# Patient Record
Sex: Male | Born: 1935 | Race: White | Hispanic: No | Marital: Married | Smoking: Never smoker
Health system: Southern US, Community
[De-identification: ages and names within clinical notes are randomized; demographics above are authoritative.]

## PROBLEM LIST (undated history)

## (undated) DIAGNOSIS — I4891 Unspecified atrial fibrillation: Secondary | ICD-10-CM

## (undated) DIAGNOSIS — R2681 Unsteadiness on feet: Secondary | ICD-10-CM

## (undated) DIAGNOSIS — I509 Heart failure, unspecified: Secondary | ICD-10-CM

## (undated) DIAGNOSIS — N529 Male erectile dysfunction, unspecified: Secondary | ICD-10-CM

## (undated) DIAGNOSIS — F0390 Unspecified dementia without behavioral disturbance: Secondary | ICD-10-CM

## (undated) DIAGNOSIS — I10 Essential (primary) hypertension: Secondary | ICD-10-CM

## (undated) DIAGNOSIS — I2510 Atherosclerotic heart disease of native coronary artery without angina pectoris: Secondary | ICD-10-CM

## (undated) DIAGNOSIS — R531 Weakness: Secondary | ICD-10-CM

## (undated) DIAGNOSIS — D649 Anemia, unspecified: Secondary | ICD-10-CM

## (undated) DIAGNOSIS — E119 Type 2 diabetes mellitus without complications: Secondary | ICD-10-CM

## (undated) DIAGNOSIS — E785 Hyperlipidemia, unspecified: Secondary | ICD-10-CM

## (undated) DIAGNOSIS — K449 Diaphragmatic hernia without obstruction or gangrene: Secondary | ICD-10-CM

## (undated) HISTORY — DX: Type 2 diabetes mellitus without complications: E11.9

## (undated) HISTORY — DX: Unsteadiness on feet: R26.81

## (undated) HISTORY — DX: Atherosclerotic heart disease of native coronary artery without angina pectoris: I25.10

## (undated) HISTORY — PX: COLONOSCOPY: SHX174

## (undated) HISTORY — DX: Essential (primary) hypertension: I10

## (undated) HISTORY — PX: CARDIAC CATHETERIZATION: SHX172

## (undated) HISTORY — DX: Unspecified atrial fibrillation: I48.91

## (undated) HISTORY — DX: Hyperlipidemia, unspecified: E78.5

## (undated) HISTORY — PX: TONSILLECTOMY: SUR1361

## (undated) HISTORY — DX: Diaphragmatic hernia without obstruction or gangrene: K44.9

## (undated) HISTORY — PX: CATARACT EXTRACTION, BILATERAL: SHX1313

## (undated) HISTORY — DX: Male erectile dysfunction, unspecified: N52.9

---

## 1999-11-05 ENCOUNTER — Ambulatory Visit (HOSPITAL_COMMUNITY): Admission: RE | Admit: 1999-11-05 | Discharge: 1999-11-06 | Payer: Self-pay | Admitting: Cardiology

## 2003-07-01 DIAGNOSIS — E119 Type 2 diabetes mellitus without complications: Secondary | ICD-10-CM

## 2003-07-01 HISTORY — DX: Type 2 diabetes mellitus without complications: E11.9

## 2008-05-09 ENCOUNTER — Ambulatory Visit (HOSPITAL_COMMUNITY): Admission: RE | Admit: 2008-05-09 | Discharge: 2008-05-09 | Payer: Self-pay | Admitting: Cardiology

## 2010-11-12 NOTE — H&P (Signed)
Bruce Mann, Bruce Mann NO.:  [id number]   MEDICAL RECORD NO.:  [id number]           PATIENT TYPE:   LOCATION:                                 FACILITY:   PHYSICIAN:  Peter M. [place], M.D.  DATE OF BIRTH:  [birth year]   DATE OF ADMISSION:  05/09/2008  DATE OF DISCHARGE:                              HISTORY & PHYSICAL   HISTORY OF PRESENT ILLNESS:  Bruce Mann is a 77 year old white male with  known history of coronary artery disease.  He underwent angioplasty of  the first obtuse marginal vessel in May 2001.  It was also noted at that  time that he had a severe stenosis in a nondominant right coronary  artery.  He has done well since then without significant anginal  symptoms.  On recent followup, he had a stress Cardiolite study.  He was  exercised on the standard Bruce protocol for 7-1/2 minutes.  He had  marked ischemic ST changes and frequent PVCs with runs of nonsustained  ventricular tachycardia.  He clinically had no symptoms of chest pain.  His subsequent Cardiolite images looked fairly good without significant  ischemia and normal ejection fraction of 70%.  However, based on his  decline in exercise tolerance and marked ECG changes and nonsustained  ventricular tachycardia, it was recommended he undergo repeat cardiac  catheterization.   His past medical history is significant for:  1. Coronary artery disease with remote angioplasty of left circumflex      coronary in 2001 in the first obtuse marginal branch.  2. Hyperlipidemia.  3. Diabetes mellitus type 2.  4. Hypercholesterolemia.  5. He also has a history of hiatal hernia.   He has no known allergies.   CURRENT MEDICATIONS:  1. Cozaar 50 mg per day.  2. HCTZ 25 mg per day.  3. Lipitor 80 mg per day.  4. Atenolol 25 mg b.i.d.  5. Norvasc 5 mg per day.  6. Aspirin 81 mg per day.  7. Calcium plus D daily.  8. Actos 30 mg per day.  9. Fish oil 1000 mg per day.  10.Trilipix 635 mg per day.  11.Prilosec 20 mg per day.  12.Multivitamin daily.   SOCIAL HISTORY:  The patient is semi-retired.  He is married and has 4  children.  Denies history of tobacco or alcohol use.   FAMILY HISTORY:  Father died at age 110 of myocardial infarction.  One  brother has had myocardial infarctions.  His mother died at age 57 with  a stroke.   His review of systems are otherwise unremarkable.   PHYSICAL EXAMINATION:  GENERAL:  The patient is a pleasant white male in  no distress.  VITAL SIGNS:  Weight is 178, blood pressure is 134/68, and pulse is 60  and regular.  HEENT:  Normocephalic and atraumatic.  Pupils are equal, round, and  reactive to light and accommodation.  Extraocular movements are full.  Oropharynx is clear.  NECK:  Supple without JVD, adenopathy, thyromegaly, or bruits.  LUNGS:  Clear to auscultation and percussion.  CARDIAC:  Regular rate and rhythm  without gallop, murmur, rub, or click.  ABDOMEN: Soft and nontender.  There are no masses or bruits.  EXTREMITIES:  Without edema.  Pedal pulses are 2+ and symmetric.  NEUROLOGIC:  Nonfocal   LABORATORY DATA:  Chest x-ray showed no active disease.  ECG at rest  shows normal sinus rhythm with normal ECG.   IMPRESSION:  1. Abnormal stress Cardiolite study with decrease exercise tolerance      and marked ECG changes.  2. Remote angioplasty of first obtuse marginal vessel.  3. Diabetes mellitus type 2.  4. Hypertension.  5. Hypercholesterolemia.   PLAN:  Proceed with cardiac catheterization with further therapy pending  these results.           ______________________________  Peter M. [place], M.D.     PMJ/MEDQ  D:  05/05/2008  T:  05/06/2008  Job:  [id number]   cc:   Gaspar Garbe, M.D.

## 2010-11-12 NOTE — Cardiovascular Report (Signed)
Bruce Mann, Bruce Mann NO.:  [id number]   MEDICAL RECORD NO.:  [id number]           PATIENT TYPE:   LOCATION:                                 FACILITY:   PHYSICIAN:  Peter M. [place], M.D.  DATE OF BIRTH:  27-Oct-1930   DATE OF PROCEDURE:  DATE OF DISCHARGE:                            CARDIAC CATHETERIZATION   INDICATIONS FOR PROCEDURE:  The patient is a 77 year old white male with  history of coronary artery disease status post angioplasty of the first  obtuse marginal vessel in May 2001.  He had a recent abnormal stress  test.  The patient does have a history of diabetes, hyperlipidemia, and  hypertension.   PROCEDURE:  Left heart catheterization coronary and left ventricular  angiography.   EQUIPMENT USED:  A 6-French 4-cm right and left Judkins catheter, 6-  French pigtail catheter, 6-French arterial sheath.   MEDICATIONS:  Local anesthesia 1% Xylocaine.   CONTRAST:  Omnipaque 100 mL.   HEMODYNAMIC DATA:  Aortic pressure is 132/55 with a mean of 88 mmHg,  left ventricular pressure is 134 with EDP of 13 mmHg.   ANGIOGRAPHIC DATA:  Left coronary artery arises normally.  It is heavily  calcified in the left main coronary, the proximal and mid LAD, and the  proximal and mid circumflex.  The left main coronary is without  significant disease.   The left anterior descending artery has a bulky segmental 70% stenosis  proximally.  There is a 40-50% stenosis in the midvessel.   There is a ramus intermediate branch which has an 80% ostial stenosis.   The proximal left circumflex coronary has diffuse 40% disease.  The  first obtuse marginal vessel is widely patent.  The distal circumflex is  without significant disease.   The right coronary artery is a codominant vessel.  It is moderately  calcified in the proximal and mid segments.  There is an 80-90% proximal  stenosis followed by a 70-80% focal stenosis in the midvessel.   Left ventricular angiography was  performed in the RAO view.  This  demonstrates normal left ventricular size and contractility with normal  systolic function.  Ejection fraction is estimated at 60%.  There is no  mitral regurgitation or prolapse.   FINAL INTERPRETATION:  1. Three-vessel obstructive atherosclerotic coronary artery disease.      The patient has moderately severe disease in      the proximal left anterior descending.  He has severe disease in      the ostial ramus branch and severe disease in the proximal and mid      right coronary artery.  2. Normal left ventricular function.           ______________________________  Peter M. [place], M.D.     PMJ/MEDQ  D:  05/09/2008  T:  05/09/2008  Job:  [id number]   cc:   Gaspar Garbe, M.D.

## 2011-04-01 LAB — GLUCOSE, CAPILLARY: Glucose-Capillary: 122 — ABNORMAL HIGH

## 2012-06-30 DIAGNOSIS — I4891 Unspecified atrial fibrillation: Secondary | ICD-10-CM

## 2012-06-30 HISTORY — DX: Unspecified atrial fibrillation: I48.91

## 2012-07-07 ENCOUNTER — Ambulatory Visit (INDEPENDENT_AMBULATORY_CARE_PROVIDER_SITE_OTHER): Payer: Medicare Other | Admitting: Cardiology

## 2012-07-07 ENCOUNTER — Encounter: Payer: Self-pay | Admitting: Cardiology

## 2012-07-07 VITALS — BP 144/76 | HR 68 | Ht 68.5 in | Wt 187.1 lb

## 2012-07-07 DIAGNOSIS — I2510 Atherosclerotic heart disease of native coronary artery without angina pectoris: Secondary | ICD-10-CM | POA: Insufficient documentation

## 2012-07-07 DIAGNOSIS — I10 Essential (primary) hypertension: Secondary | ICD-10-CM

## 2012-07-07 DIAGNOSIS — E785 Hyperlipidemia, unspecified: Secondary | ICD-10-CM

## 2012-07-07 DIAGNOSIS — I4891 Unspecified atrial fibrillation: Secondary | ICD-10-CM

## 2012-07-07 MED ORDER — RIVAROXABAN 20 MG PO TABS: 20.0000 mg | ORAL_TABLET | Freq: Every day | ORAL | Status: DC

## 2012-07-07 MED ORDER — NITROGLYCERIN 0.4 MG SL SUBL: 0.4000 mg | SUBLINGUAL_TABLET | SUBLINGUAL | Status: DC | PRN

## 2012-07-07 NOTE — Progress Notes (Signed)
Bernadene Bell Date of Birth: 05/02/1931 Medical Record #[id number]  History of Present Illness: Bruce Mann is seen for followup today. It looks like it's been a couple of years since I last saw him. Unfortunately I do not have his old records and there is little information in Epic. He has a known history of coronary disease. He had angioplasty of the first obtuse marginal vessel may of 2001. In 2009 he had an abnormal stress test which led to heart catheterization. He had heavy coronary calcification. The LAD had a 70% stenosis proximally. There was a 40-50% stenosis in the mid vessel. The ramus vessel had an 80% ostial stenosis. The left circumflex had diffuse 40% disease in the previous angioplasty site was widely patent. The right coronary was a codominant vessel. It was relatively small. There is an 80-90% stenosis in the proximal vessel followed by a 70% stenosis in the mid vessel. LV function was normal at that time. He was asymptomatic. We recommended medical management. He has really done well since then and is had no symptoms of chest pain, dyspnea, or palpitations. He remains active and has been doing a lot of traveling. He has no history of stroke or TIA. He has no history of bleeding disorder.  Current Outpatient Prescriptions on File Prior to Visit  Medication Sig Dispense Refill  . amLODipine (NORVASC) 5 MG tablet Take 5 mg by mouth daily.      Marland Kitchen aspirin 81 MG tablet Take 81 mg by mouth daily.      Marland Kitchen atenolol (TENORMIN) 25 MG tablet Take 25 mg by mouth 2 (two) times daily.      Marland Kitchen atorvastatin (LIPITOR) 80 MG tablet Take 80 mg by mouth daily.      . Calcium Carbonate-Vitamin D (CALCIUM PLUS VITAMIN D PO) Take by mouth daily.      . Choline Fenofibrate (TRILIPIX PO) Take by mouth.      . hydrochlorothiazide (HYDRODIURIL) 25 MG tablet Take 25 mg by mouth daily.      Marland Kitchen losartan (COZAAR) 50 MG tablet Take 50 mg by mouth daily.      . Multiple Vitamin (MULTIVITAMIN) tablet Take 1 tablet by  mouth daily.      . Omega-3 Fatty Acids (FISH OIL) 1000 MG CAPS Take 1,000 mg by mouth daily.      . Omeprazole (PRILOSEC PO) Take by mouth daily.      . pioglitazone (ACTOS) 30 MG tablet Take 30 mg by mouth daily.      . nitroGLYCERIN (NITROSTAT) 0.4 MG SL tablet Place 1 tablet (0.4 mg total) under the tongue every 5 (five) minutes as needed for chest pain.  25 tablet  6  . Rivaroxaban (XARELTO) 20 MG TABS Take 1 tablet (20 mg total) by mouth daily.  30 tablet  6    No Known Allergies  Past Medical History  Diagnosis Date  . Dyslipidemia   . Hypertension   . CAD (coronary artery disease)   . Hyperlipidemia   . Diabetes mellitus type II   . Atrial fibrillation     Past Surgical History  Procedure Date  . Cardiac catheterization     Ejection Fraction 60%    History  Smoking status  . Never Smoker   Smokeless tobacco  . Not on file    History  Alcohol Use No    Family History  Problem Relation Age of Onset  . Stroke Mother   . Heart attack Father   . Heart  attack Brother     Review of Systems: As noted in history of present illness.  All other systems were reviewed and are negative.  Physical Exam: BP 144/76  Pulse 68  Ht 5' 8.5" (1.74 m)  Wt 187 lb 1.9 oz (84.877 kg)  BMI 28.04 kg/m2 He is a pleasant white male in no acute distress. HEENT: Normocephalic, atraumatic. Pupils equal round and reactive to light accommodation. Extraocular movements are full. Oropharynx is clear with good dental repair. Neck is supple without JVD, adenopathy, thyromegaly, or bruits. Lungs: Clear Cardiovascular: Irregular rate and rhythm without gallop, murmur, or click. Normal S1 and S2. Abdomen: Soft and nontender. No hepatosplenomegaly or masses. Bowel sounds are positive. Extremities: No cyanosis, edema. Pedal pulses are 2+ and symmetric. Skin: Warm and dry. Neuro: Alert and oriented x3. Cranial nerves II through XII are intact. He has no focal motor or sensory  deficits. LABORATORY DATA: ECG today demonstrates atrial fibrillation with a rate of 68 beats per minute. It is otherwise normal.  Assessment / Plan: 1. Atrial fibrillation-duration is unknown. Patient is asymptomatic probably because his heart rate is normal on atenolol. He has a [place] score of 3 and is at intermediate to high-risk of stroke. We will continue rate control with atenolol. I have recommended anticoagulation and he has opted to try one of the novel anticoagulants. We will start him on Xarelto 20 mg daily. He needs to stop aspirin. We will obtain an echocardiogram. I've also requested a copy of his most recent laboratory with data from his primary care. Patient has expressed interest in enrolling in the [employer] registry.  2. Coronary disease. He had moderate three-vessel disease by cardiac catheterization in 2009. He is asymptomatic. We will continue medical management with beta blocker, ARB, and amlodipine. Continue statin therapy.  3. Hypertension, controlled.  4. Hypercholesterolemia continue high-dose statin and fish oil.  5. Diabetes mellitus type 2 on oral therapy.

## 2012-07-07 NOTE — Patient Instructions (Signed)
We will start you on Xarelto 20 mg daily   Stop taking ASA  We will schedule you for an Echocardiogram  I will see you in one month.

## 2012-07-08 ENCOUNTER — Telehealth: Payer: Self-pay | Admitting: Cardiology

## 2012-07-08 NOTE — Telephone Encounter (Signed)
[facility] called [phone].Patient's ID # [id number].Xarelto approved for 1 year.[facility] on [address] notified.Unable to notify patient phone # [phone] disconnected.

## 2012-07-08 NOTE — Telephone Encounter (Signed)
New Problem:    Patient called in stating that his insurance company needs prior authorization before they will fill his Rivaroxaban (XARELTO) 20 MG TABS.

## 2012-07-12 ENCOUNTER — Other Ambulatory Visit (HOSPITAL_COMMUNITY): Payer: Self-pay | Admitting: Cardiology

## 2012-07-12 DIAGNOSIS — I4891 Unspecified atrial fibrillation: Secondary | ICD-10-CM

## 2012-07-13 ENCOUNTER — Ambulatory Visit (HOSPITAL_COMMUNITY): Payer: Medicare Other | Attending: Cardiology | Admitting: Radiology

## 2012-07-13 DIAGNOSIS — I369 Nonrheumatic tricuspid valve disorder, unspecified: Secondary | ICD-10-CM | POA: Insufficient documentation

## 2012-07-13 DIAGNOSIS — E119 Type 2 diabetes mellitus without complications: Secondary | ICD-10-CM | POA: Insufficient documentation

## 2012-07-13 DIAGNOSIS — I4891 Unspecified atrial fibrillation: Secondary | ICD-10-CM

## 2012-07-13 DIAGNOSIS — I2510 Atherosclerotic heart disease of native coronary artery without angina pectoris: Secondary | ICD-10-CM | POA: Insufficient documentation

## 2012-07-13 DIAGNOSIS — I10 Essential (primary) hypertension: Secondary | ICD-10-CM | POA: Insufficient documentation

## 2012-07-13 DIAGNOSIS — I080 Rheumatic disorders of both mitral and aortic valves: Secondary | ICD-10-CM | POA: Insufficient documentation

## 2012-07-13 NOTE — Progress Notes (Signed)
Echocardiogram performed.  

## 2012-07-15 ENCOUNTER — Encounter: Payer: Self-pay | Admitting: Cardiology

## 2012-08-16 ENCOUNTER — Encounter: Payer: Self-pay | Admitting: Cardiology

## 2012-08-16 ENCOUNTER — Ambulatory Visit (INDEPENDENT_AMBULATORY_CARE_PROVIDER_SITE_OTHER): Payer: Medicare Other | Admitting: Cardiology

## 2012-08-16 VITALS — BP 130/64 | HR 72 | Ht 68.5 in | Wt 187.0 lb

## 2012-08-16 DIAGNOSIS — I4891 Unspecified atrial fibrillation: Secondary | ICD-10-CM

## 2012-08-16 DIAGNOSIS — I10 Essential (primary) hypertension: Secondary | ICD-10-CM

## 2012-08-16 DIAGNOSIS — E785 Hyperlipidemia, unspecified: Secondary | ICD-10-CM

## 2012-08-16 DIAGNOSIS — I2510 Atherosclerotic heart disease of native coronary artery without angina pectoris: Secondary | ICD-10-CM

## 2012-08-16 NOTE — Progress Notes (Signed)
Bernadene Bell Date of Birth: [birth year] Medical Record #[id number]  History of Present Illness: Jakeem is seen for followup today.  He has a known history of coronary disease. He had angioplasty of the first obtuse marginal vessel may of 2001. In 2009 he had an abnormal stress test which led to heart catheterization. He had heavy coronary calcification. The LAD had a 70% stenosis proximally. There was a 40-50% stenosis in the mid vessel. The ramus vessel had an 80% ostial stenosis. The left circumflex had diffuse 40% disease in the previous angioplasty site was widely patent. The right coronary was a codominant vessel. It was relatively small. There is an 80-90% stenosis in the proximal vessel followed by a 70% stenosis in the mid vessel. LV function was normal at that time. He was asymptomatic. We recommended medical management.  On his visit last month he was diagnosed with atrial fibrillation. His rate was controlled on atenolol. He is asymptomatic. He was started on anticoagulation which he is tolerating well. He has no cardiac symptoms today.  Current Outpatient Prescriptions on File Prior to Visit  Medication Sig Dispense Refill  . amLODipine (NORVASC) 5 MG tablet Take 5 mg by mouth daily.      Marland Kitchen atenolol (TENORMIN) 25 MG tablet Take 25 mg by mouth 2 (two) times daily.      Marland Kitchen atorvastatin (LIPITOR) 80 MG tablet Take 80 mg by mouth daily.      . Calcium Carbonate-Vitamin D (CALCIUM PLUS VITAMIN D PO) Take by mouth daily.      . Choline Fenofibrate (TRILIPIX PO) Take by mouth.      . hydrochlorothiazide (HYDRODIURIL) 25 MG tablet Take 25 mg by mouth daily.      Marland Kitchen losartan (COZAAR) 50 MG tablet Take 50 mg by mouth daily.      . Multiple Vitamin (MULTIVITAMIN) tablet Take 1 tablet by mouth daily.      . nitroGLYCERIN (NITROSTAT) 0.4 MG SL tablet Place 1 tablet (0.4 mg total) under the tongue every 5 (five) minutes as needed for chest pain.  25 tablet  6  . Omega-3 Fatty Acids (FISH OIL) 1000  MG CAPS Take 1,000 mg by mouth daily.      . Omeprazole (PRILOSEC PO) Take by mouth daily.      . pioglitazone (ACTOS) 30 MG tablet Take 30 mg by mouth daily.      . Rivaroxaban (XARELTO) 20 MG TABS Take 1 tablet (20 mg total) by mouth daily.  30 tablet  6   No current facility-administered medications on file prior to visit.    No Known Allergies  Past Medical History  Diagnosis Date  . Dyslipidemia   . Hypertension   . CAD (coronary artery disease)   . Hyperlipidemia   . Diabetes mellitus type II   . Atrial fibrillation     Past Surgical History  Procedure Laterality Date  . Cardiac catheterization      Ejection Fraction 60%    History  Smoking status  . Never Smoker   Smokeless tobacco  . Not on file    History  Alcohol Use No    Family History  Problem Relation Age of Onset  . Stroke Mother   . Heart attack Father   . Heart attack Brother     Review of Systems: As noted in history of present illness.  All other systems were reviewed and are negative.  Physical Exam: BP 130/64  Pulse 72  Ht 5' 8.5" (1.74  m)  Wt 187 lb (84.823 kg)  BMI 28.02 kg/m2  SpO2 97% He is a pleasant white male in no acute distress. HEENT: Normal. Neck is supple without JVD, adenopathy, thyromegaly, or bruits. Lungs: Clear Cardiovascular: Irregular rate and rhythm without gallop, murmur, or click. Normal S1 and S2. Abdomen: Soft and nontender. No hepatosplenomegaly or masses. Bowel sounds are positive. Extremities: No cyanosis, edema. Pedal pulses are 2+ and symmetric. Skin: Warm and dry. Neuro: Alert and oriented x3. Cranial nerves II through XII are intact. He has no focal motor or sensory deficits. LABORATORY DATA:  Transthoracic Echocardiography  Patient: Ndrew, Creason MR #: [id number] Study Date: 07/13/2012 Gender: M Age: 87 Height: 172.7cm Weight: 86.2kg BSA: 53m^2 Pt. Status: Room:  ORDERING [place], Peter REFERRING [place], Peter ATTENDING Olga Millers SONOGRAPHER Aida Raider, RDCS PERFORMING [facility], [facility] cc:  ------------------------------------------------------------ LV EF: 55% - 60%  ------------------------------------------------------------ Indications: Atrial fibrillation - 427.31.  ------------------------------------------------------------ History: PMH: Acquired from the patient and from the patient's chart. PMH: CAD. Atrial Fibrillation. Risk factors: Hypertension. Diabetes mellitus. Dyslipidemia.  ------------------------------------------------------------ Study Conclusions  - Left ventricle: The cavity size was normal. Wall thickness was increased in a pattern of mild LVH. There was mild focal basal hypertrophy of the septum. Systolic function was normal. The estimated ejection fraction was in the range of 55% to 60%. Wall motion was normal; there were no regional wall motion abnormalities. The study is not technically sufficient to allow evaluation of LV diastolic function. - Aortic valve: Trivial regurgitation. - Mitral valve: Calcified annulus. - Left atrium: The atrium was moderately dilated. - Right atrium: The atrium was moderately dilated. - Pulmonary arteries: Systolic pressure was mildly increased. PA peak pressure: 41mm Hg (S). Transthoracic echocardiography. M-mode, complete 2D, spectral Doppler, and color Doppler. Height: Height: 172.7cm. Height: 68in. Weight: Weight: 86.2kg. Weight: 189.6lb. Body mass index: BMI: 28.9kg/m^2. Body surface area: BSA: 25m^2. Blood pressure: 144/76. Patient status: Outpatient. Location: [place] [facility]  ------------------------------------------------------------  ------------------------------------------------------------ Left ventricle: The cavity size was normal. Wall thickness was increased in a pattern of mild LVH. There was mild focal basal hypertrophy of the septum. Systolic function was normal. The estimated ejection fraction was in  the range of 55% to 60%. Wall motion was normal; there were no regional wall motion abnormalities. The study is not technically sufficient to allow evaluation of LV diastolic function.  ------------------------------------------------------------ Aortic valve: Trileaflet; mildly thickened leaflets. Mobility was not restricted. Doppler: Transvalvular velocity was within the normal range. There was no stenosis. Trivial regurgitation.  ------------------------------------------------------------ Aorta: Aortic root: The aortic root was normal in size.  ------------------------------------------------------------ Mitral valve: Calcified annulus. Mobility was not restricted. Doppler: Transvalvular velocity was within the normal range. There was no evidence for stenosis. Trivial regurgitation. Peak gradient: 5mm Hg (D).  ------------------------------------------------------------ Left atrium: The atrium was moderately dilated.  ------------------------------------------------------------ Right ventricle: The cavity size was normal. Systolic function was normal.  ------------------------------------------------------------ Pulmonic valve: Doppler: Transvalvular velocity was within the normal range. There was no evidence for stenosis.  ------------------------------------------------------------ Tricuspid valve: Structurally normal valve. Doppler: Transvalvular velocity was within the normal range. Trivial regurgitation.  ------------------------------------------------------------ Pulmonary artery: Systolic pressure was mildly increased.  ------------------------------------------------------------ Right atrium: The atrium was moderately dilated.  ------------------------------------------------------------ Pericardium: There was no pericardial effusion.  ------------------------------------------------------------ Systemic veins: Inferior vena cava: The vessel was normal in  size.  ------------------------------------------------------------  2D measurements Normal Doppler measurements Normal Left ventricle Main pulmonary LVID ED, 44.8 mm 43-52 artery chord, Pressure, 41 mm Hg =30 PLAX  S LVID ES, 27.1 mm 23-38 Left ventricle chord, Ea, lat 6.67 cm/s ------ PLAX ann, tiss FS, chord, 40 % >29 DP PLAX E/Ea, lat 17.39 ------ LVPW, ED 12.8 mm ------ ann, tiss IVS/LVPW 1.15 <1.3 DP ratio, ED Ea, med 8.42 cm/s ------ Ventricular septum ann, tiss IVS, ED 14.7 mm ------ DP LVOT E/Ea, med 13.78 ------ Diam, S 18 mm ------ ann, tiss Area 2.54 cm^2 ------ DP Diam 18 mm ------ LVOT Aorta Peak vel, 113 cm/s ------ Root diam, 37 mm ------ S ED VTI, S 26.1 cm ------ Left atrium Peak 5 mm Hg ------ AP dim 49 mm ------ gradient, AP dim 2.45 cm/m^2 <2.2 S index Stroke vol 66.4 ml ------ Stroke 33.2 ml/m^ ------ index 2 Mitral valve Peak E vel 116 cm/s ------ Decelerati 285 ms 150-23 on time 0 Peak 5 mm Hg ------ gradient, D Tricuspid valve Regurg 298 cm/s ------ peak vel Peak RV-RA 36 mm Hg ------ gradient, S Systemic veins Estimated 5 mm Hg ------ CVP Right ventricle Pressure, 41 mm Hg <30 S Sa vel, 16.4 cm/s ------ lat ann, tiss DP  ------------------------------------------------------------ Prepared and Electronically Authenticated by  Olga Millers 2014-01-14T16:38:53.617       Assessment / Plan: 1. Atrial fibrillation-permanent. Patient is asymptomatic. HR is controlled on atenolol. He has a [place] score of 3 and is at intermediate to high-risk of stroke. Continue Xarelto 20 mg daily. Echo shows moderate biatrial enlargement making it unlikely that he will convert.  2. Coronary disease. He had moderate three-vessel disease by cardiac catheterization in 2009. He is asymptomatic. We will continue medical management with beta blocker, ARB, and amlodipine. Continue statin therapy.  3. Hypertension, controlled.  4.  Hypercholesterolemia continue high-dose statin and fish oil.  5. Diabetes mellitus type 2 on oral therapy.

## 2012-08-16 NOTE — Patient Instructions (Signed)
Continue your current therapy  I will see you again in 6 months.   

## 2013-02-02 ENCOUNTER — Other Ambulatory Visit: Payer: Self-pay

## 2013-03-14 ENCOUNTER — Ambulatory Visit (INDEPENDENT_AMBULATORY_CARE_PROVIDER_SITE_OTHER): Payer: Medicare Other | Admitting: Cardiology

## 2013-03-14 ENCOUNTER — Encounter: Payer: Self-pay | Admitting: Cardiology

## 2013-03-14 VITALS — BP 126/74 | HR 62 | Ht 68.5 in | Wt 184.4 lb

## 2013-03-14 DIAGNOSIS — I4891 Unspecified atrial fibrillation: Secondary | ICD-10-CM

## 2013-03-14 DIAGNOSIS — I2510 Atherosclerotic heart disease of native coronary artery without angina pectoris: Secondary | ICD-10-CM

## 2013-03-14 DIAGNOSIS — I10 Essential (primary) hypertension: Secondary | ICD-10-CM

## 2013-03-14 DIAGNOSIS — E785 Hyperlipidemia, unspecified: Secondary | ICD-10-CM

## 2013-03-14 NOTE — Progress Notes (Signed)
Bruce Mann Date of Birth: 11-08-1930 Medical Record #[id number]  History of Present Illness: Bruce Mann is seen for followup today.  He has a known history of coronary disease. He had angioplasty of the first obtuse marginal vessel may of 2001. In 2009 he had an abnormal stress test which led to heart catheterization. This demonstrated moderate three-vessel obstructive coronary disease. LV function was normal at that time. He was asymptomatic. We recommended medical management.  He also has a history of atrial fibrillation. This is managed with rate control and anticoagulation. Since his last visit he reports he is doing well. He still has no symptoms of chest pain or dyspnea. He remains active. He states he is trying to get more exercise and eat less. He has lost 3 pounds.  Current Outpatient Prescriptions on File Prior to Visit  Medication Sig Dispense Refill  . amLODipine (NORVASC) 5 MG tablet Take 5 mg by mouth daily.      Marland Kitchen atenolol (TENORMIN) 25 MG tablet Take 25 mg by mouth 2 (two) times daily.      Marland Kitchen atorvastatin (LIPITOR) 80 MG tablet Take 80 mg by mouth daily.      . Calcium Carbonate-Vitamin D (CALCIUM PLUS VITAMIN D PO) Take by mouth daily.      . Choline Fenofibrate (TRILIPIX PO) Take by mouth.      . hydrochlorothiazide (HYDRODIURIL) 25 MG tablet Take 25 mg by mouth daily.      Marland Kitchen losartan (COZAAR) 50 MG tablet Take 50 mg by mouth daily.      . Multiple Vitamin (MULTIVITAMIN) tablet Take 1 tablet by mouth daily.      . nitroGLYCERIN (NITROSTAT) 0.4 MG SL tablet Place 1 tablet (0.4 mg total) under the tongue every 5 (five) minutes as needed for chest pain.  25 tablet  6  . Omega-3 Fatty Acids (FISH OIL) 1000 MG CAPS Take 1,000 mg by mouth daily.      . Omeprazole (PRILOSEC PO) Take by mouth daily.      . pioglitazone (ACTOS) 30 MG tablet Take 30 mg by mouth daily.       No current facility-administered medications on file prior to visit.    No Known Allergies  Past Medical  History  Diagnosis Date  . Dyslipidemia   . Hypertension   . CAD (coronary artery disease)   . Hyperlipidemia   . Diabetes mellitus type II   . Atrial fibrillation     Past Surgical History  Procedure Laterality Date  . Cardiac catheterization      Ejection Fraction 60%    History  Smoking status  . Never Smoker   Smokeless tobacco  . Not on file    History  Alcohol Use No    Family History  Problem Relation Age of Onset  . Stroke Mother   . Heart attack Father   . Heart attack Brother     Review of Systems: As noted in history of present illness.  All other systems were reviewed and are negative.  Physical Exam: BP 126/74  Pulse 62  Ht 5' 8.5" (1.74 m)  Wt 184 lb 6.4 oz (83.643 kg)  BMI 27.63 kg/m2  SpO2 97% He is a pleasant white male in no acute distress. HEENT: Normal. Neck is supple without JVD, adenopathy, thyromegaly, or bruits. Lungs: Clear Cardiovascular: Irregular rate and rhythm without gallop, murmur, or click. Normal S1 and S2. Abdomen: Soft and nontender. No hepatosplenomegaly or masses. Bowel sounds are positive. Extremities: No  cyanosis, edema. Pedal pulses are 2+ and symmetric. Skin: Warm and dry. Neuro: Alert and oriented x3. Cranial nerves II through XII are intact. He has no focal motor or sensory deficits.  LABORATORY DATA:    Assessment / Plan: 1. Atrial fibrillation-permanent. Patient is asymptomatic. HR is controlled on atenolol.  Continue Xarelto 20 mg daily. Echo shows moderate biatrial enlargement making it unlikely that he will convert.  2. Coronary disease. He had moderate three-vessel disease by cardiac catheterization in 2009. He is asymptomatic. We will continue medical management with beta blocker, ARB, and amlodipine. Continue statin therapy.  3. Hypertension, controlled.  4. Hypercholesterolemia continue high-dose statin and fish oil.  5. Diabetes mellitus type 2 on oral therapy.

## 2013-03-14 NOTE — Patient Instructions (Signed)
Continue your current therapy  I will see you in 6 months.   

## 2013-05-05 ENCOUNTER — Other Ambulatory Visit: Payer: Self-pay

## 2013-09-27 ENCOUNTER — Ambulatory Visit: Payer: Medicare Other | Admitting: Cardiology

## 2013-10-12 ENCOUNTER — Encounter: Payer: Self-pay | Admitting: Cardiology

## 2013-10-12 ENCOUNTER — Ambulatory Visit (INDEPENDENT_AMBULATORY_CARE_PROVIDER_SITE_OTHER): Payer: Medicare Other | Admitting: Cardiology

## 2013-10-12 VITALS — BP 118/62 | HR 58 | Ht 68.5 in | Wt 184.8 lb

## 2013-10-12 DIAGNOSIS — E785 Hyperlipidemia, unspecified: Secondary | ICD-10-CM

## 2013-10-12 DIAGNOSIS — I4891 Unspecified atrial fibrillation: Secondary | ICD-10-CM

## 2013-10-12 DIAGNOSIS — I10 Essential (primary) hypertension: Secondary | ICD-10-CM

## 2013-10-12 DIAGNOSIS — I2510 Atherosclerotic heart disease of native coronary artery without angina pectoris: Secondary | ICD-10-CM

## 2013-10-12 MED ORDER — ATENOLOL 25 MG PO TABS: 25.0000 mg | ORAL_TABLET | Freq: Every day | ORAL | Status: DC

## 2013-10-12 NOTE — Patient Instructions (Signed)
Reduce atenolol to 25 mg once a day  Continue your other therapy  I will see you in 6 months.  We will get a copy of your lab work from Dr. Wylene Simmerisovec

## 2013-10-12 NOTE — Progress Notes (Signed)
Bruce Mann Date of Birth: Mar 16, 1931 Medical Record #[id number]  History of Present Illness: Bruce Mann is seen for followup today.  He has a known history of coronary disease. He had angioplasty of the first obtuse marginal vessel may of 2001. In 2009 he had an abnormal stress test which led to heart catheterization. This demonstrated moderate three-vessel obstructive coronary disease. LV function was normal at that time. He was asymptomatic. We recommended medical management. He denies any symptoms of angina. No dyspnea. No fatigue or dizziness.  He also has a history of atrial fibrillation. This is managed with rate control and anticoagulation. He remains active.  Current Outpatient Prescriptions on File Prior to Visit  Medication Sig Dispense Refill  . amLODipine (NORVASC) 5 MG tablet Take 5 mg by mouth daily.      Marland Kitchen. atorvastatin (LIPITOR) 80 MG tablet Take 80 mg by mouth daily.      . Calcium Carbonate-Vitamin D (CALCIUM PLUS VITAMIN D PO) Take by mouth daily.      . Choline Fenofibrate (TRILIPIX PO) Take by mouth.      . hydrochlorothiazide (HYDRODIURIL) 25 MG tablet Take 25 mg by mouth daily.      Marland Kitchen. losartan (COZAAR) 50 MG tablet Take 50 mg by mouth daily.      . Multiple Vitamin (MULTIVITAMIN) tablet Take 1 tablet by mouth daily.      . nitroGLYCERIN (NITROSTAT) 0.4 MG SL tablet Place 1 tablet (0.4 mg total) under the tongue every 5 (five) minutes as needed for chest pain.  25 tablet  6  . Omega-3 Fatty Acids (FISH OIL) 1000 MG CAPS Take 1,000 mg by mouth daily.      . Omeprazole (PRILOSEC PO) Take by mouth daily.      . pioglitazone (ACTOS) 30 MG tablet Take 30 mg by mouth daily.      . Rivaroxaban (XARELTO) 20 MG TABS tablet Take 15 mg by mouth daily.        No current facility-administered medications on file prior to visit.    No Known Allergies  Past Medical History  Diagnosis Date  . Dyslipidemia   . Hypertension   . CAD (coronary artery disease)   . Hyperlipidemia    . Diabetes mellitus type II   . Atrial fibrillation     Past Surgical History  Procedure Laterality Date  . Cardiac catheterization      Ejection Fraction 60%    History  Smoking status  . Never Smoker   Smokeless tobacco  . Not on file    History  Alcohol Use No    Family History  Problem Relation Age of Onset  . Stroke Mother   . Heart attack Father   . Heart attack Brother     Review of Systems: As noted in history of present illness.  All other systems were reviewed and are negative.  Physical Exam: BP 118/62  Pulse 58  Ht 5' 8.5" (1.74 m)  Wt 184 lb 12.8 oz (83.825 kg)  BMI 27.69 kg/m2 He is a pleasant white male in no acute distress. HEENT: Normal. Neck is supple without JVD, adenopathy, thyromegaly, or bruits. Lungs: Clear Cardiovascular: Irregular rate and rhythm without gallop, murmur, or click. Normal S1 and S2. Abdomen: Soft and nontender. No hepatosplenomegaly or masses. Bowel sounds are positive. Extremities: No cyanosis, edema. Pedal pulses are 2+ and symmetric. Skin: Warm and dry. Neuro: Alert and oriented x3. Cranial nerves II through XII are intact. He has no focal motor  or sensory deficits.  LABORATORY DATA: Ecg shows atrial fibrillation with slow ventricular response of 57 bpm. Otherwise normal.   Assessment / Plan: 1. Atrial fibrillation-permanent. Patient is asymptomatic. I am concerned that his HR is too slow. We will reduce atenolol to 25 mg daily. Continue Xarelto 20 mg daily.   2. Coronary disease. He had moderate three-vessel disease by cardiac catheterization in 2009. He is asymptomatic. We will continue medical management with beta blocker, ARB, and amlodipine. Continue statin therapy.  3. Hypertension, controlled.  4. Hypercholesterolemia continue high-dose statin and fish oil.  5. Diabetes mellitus type 2 on oral therapy.

## 2014-07-14 ENCOUNTER — Ambulatory Visit (INDEPENDENT_AMBULATORY_CARE_PROVIDER_SITE_OTHER): Payer: Medicare Other | Admitting: Cardiology

## 2014-07-14 ENCOUNTER — Encounter: Payer: Self-pay | Admitting: Cardiology

## 2014-07-14 VITALS — BP 119/70 | HR 67 | Ht 68.0 in | Wt 186.1 lb

## 2014-07-14 DIAGNOSIS — I482 Chronic atrial fibrillation, unspecified: Secondary | ICD-10-CM

## 2014-07-14 DIAGNOSIS — E785 Hyperlipidemia, unspecified: Secondary | ICD-10-CM

## 2014-07-14 DIAGNOSIS — I2510 Atherosclerotic heart disease of native coronary artery without angina pectoris: Secondary | ICD-10-CM

## 2014-07-14 NOTE — Patient Instructions (Signed)
Continue your current therapy  I will see you in 6 months.   

## 2014-07-14 NOTE — Progress Notes (Signed)
Bruce Mann Date of Birth: 04-09-1931 Medical Record #[id number]  History of Present Illness: Bruce Mann is seen for followup CAD and atrial fibrillation.  He has a known history of coronary disease. He had angioplasty of the first obtuse marginal vessel may of 2001. In 2009 he had an abnormal stress test which led to heart catheterization. This demonstrated moderate three-vessel obstructive coronary disease. LV function was normal at that time. He was asymptomatic. We recommended medical management. He also has a history of atrial fibrillation. This is managed with rate control and anticoagulation. He remains active. He denies any chest pain, SOB, or palpitations. He walks regularly.   Current Outpatient Prescriptions on File Prior to Visit  Medication Sig Dispense Refill  . amLODipine (NORVASC) 5 MG tablet Take 5 mg by mouth daily.    Marland Kitchen. atenolol (TENORMIN) 25 MG tablet Take 1 tablet (25 mg total) by mouth daily.    Marland Kitchen. atorvastatin (LIPITOR) 80 MG tablet Take 80 mg by mouth daily.    . Calcium Carbonate-Vitamin D (CALCIUM PLUS VITAMIN D PO) Take by mouth daily.    . Choline Fenofibrate (TRILIPIX PO) Take by mouth.    . hydrochlorothiazide (HYDRODIURIL) 25 MG tablet Take 25 mg by mouth daily.    Marland Kitchen. losartan (COZAAR) 50 MG tablet Take 50 mg by mouth daily.    . Multiple Vitamin (MULTIVITAMIN) tablet Take 1 tablet by mouth daily.    . nitroGLYCERIN (NITROSTAT) 0.4 MG SL tablet Place 1 tablet (0.4 mg total) under the tongue every 5 (five) minutes as needed for chest pain. 25 tablet 6  . Omega-3 Fatty Acids (FISH OIL) 1000 MG CAPS Take 1,000 mg by mouth daily.    . Omeprazole (PRILOSEC PO) Take by mouth daily.    . pioglitazone (ACTOS) 30 MG tablet Take 30 mg by mouth daily.    . Rivaroxaban (XARELTO) 20 MG TABS tablet Take 15 mg by mouth daily.      No current facility-administered medications on file prior to visit.    No Known Allergies  Past Medical History  Diagnosis Date  . Dyslipidemia    . Hypertension   . CAD (coronary artery disease)   . Hyperlipidemia   . Diabetes mellitus type II   . Atrial fibrillation     Past Surgical History  Procedure Laterality Date  . Cardiac catheterization      Ejection Fraction 60%    History  Smoking status  . Never Smoker   Smokeless tobacco  . Not on file    History  Alcohol Use No    Family History  Problem Relation Age of Onset  . Stroke Mother   . Heart attack Father   . Heart attack Brother     Review of Systems: As noted in history of present illness.  All other systems were reviewed and are negative.  Physical Exam: BP 119/70 mmHg  Pulse 67  Ht 5\' 8"  (1.727 m)  Wt 186 lb 1.6 oz (84.414 kg)  BMI 28.30 kg/m2 He is a pleasant white male in no acute distress. HEENT: Normal. Neck is supple without JVD, adenopathy, thyromegaly, or bruits. Lungs: Clear Cardiovascular: Irregular rate and rhythm without gallop, murmur, or click. Normal S1 and S2. Abdomen: Soft and nontender. No hepatosplenomegaly or masses. Bowel sounds are positive. Extremities: No cyanosis, edema. Pedal pulses are 2+ and symmetric. Skin: Warm and dry. Neuro: Alert and oriented x3. Cranial nerves II through XII are intact. He has no focal motor or sensory deficits.  LABORATORY DATA:   Assessment / Plan: 1. Atrial fibrillation-permanent. HR is well controlled. We will continue atenolol  25 mg daily. Continue Xarelto 20 mg daily.   2. Coronary disease. He had moderate three-vessel disease by cardiac catheterization in 2009. He is asymptomatic. We will continue medical management with beta blocker, ARB, and amlodipine. Continue statin therapy.  3. Hypertension, controlled.  4. Hypercholesterolemia continue high-dose statin and fish oil. Labs followed by primary care.  5. Diabetes mellitus type 2 on oral therapy.

## 2014-12-25 ENCOUNTER — Other Ambulatory Visit: Payer: Self-pay

## 2015-03-15 ENCOUNTER — Ambulatory Visit (INDEPENDENT_AMBULATORY_CARE_PROVIDER_SITE_OTHER): Payer: Medicare Other | Admitting: Cardiology

## 2015-03-15 ENCOUNTER — Encounter: Payer: Self-pay | Admitting: Cardiology

## 2015-03-15 VITALS — BP 122/58 | HR 57 | Ht 69.0 in | Wt 180.3 lb

## 2015-03-15 DIAGNOSIS — I2510 Atherosclerotic heart disease of native coronary artery without angina pectoris: Secondary | ICD-10-CM | POA: Diagnosis not present

## 2015-03-15 DIAGNOSIS — I482 Chronic atrial fibrillation, unspecified: Secondary | ICD-10-CM

## 2015-03-15 DIAGNOSIS — I10 Essential (primary) hypertension: Secondary | ICD-10-CM | POA: Diagnosis not present

## 2015-03-15 DIAGNOSIS — E785 Hyperlipidemia, unspecified: Secondary | ICD-10-CM | POA: Diagnosis not present

## 2015-03-15 MED ORDER — NITROGLYCERIN 0.4 MG SL SUBL: 0.4000 mg | SUBLINGUAL_TABLET | SUBLINGUAL | Status: DC | PRN

## 2015-03-15 NOTE — Progress Notes (Signed)
Bernadene Bell Date of Birth: 07/23/1930 Medical Record #[id number]  History of Present Illness: Bruce Mann is seen for followup CAD and atrial fibrillation.  He has a known history of coronary disease. He had angioplasty of the first obtuse marginal vessel may of 2001. In 2009 he had an abnormal stress test which led to heart catheterization. This demonstrated moderate three-vessel obstructive coronary disease. LV function was normal at that time. He was asymptomatic. We recommended medical management. He also has a history of atrial fibrillation. This is managed with rate control and anticoagulation. On follow up today he is feeling well.  [facility] remains [facility]- [facility] and [place] [facility].  He denies any chest pain, SOB, or palpitations. No dizziness. Energy level is good.    Current Outpatient Prescriptions on File Prior to Visit  Medication Sig Dispense Refill  . amLODipine (NORVASC) 5 MG tablet Take 5 mg by mouth daily.    Marland Kitchen atenolol (TENORMIN) 25 MG tablet Take 1 tablet (25 mg total) by mouth daily.    Marland Kitchen atorvastatin (LIPITOR) 80 MG tablet Take 80 mg by mouth daily.    . Calcium Carbonate-Vitamin D (CALCIUM PLUS VITAMIN D PO) Take by mouth daily.    . Choline Fenofibrate (TRILIPIX PO) Take by mouth.    . hydrochlorothiazide (HYDRODIURIL) 25 MG tablet Take 25 mg by mouth daily.    Marland Kitchen losartan (COZAAR) 50 MG tablet Take 50 mg by mouth daily.    . Multiple Vitamin (MULTIVITAMIN) tablet Take 1 tablet by mouth daily.    . Omega-3 Fatty Acids (FISH OIL) 1000 MG CAPS Take 1,000 mg by mouth daily.    . Omeprazole (PRILOSEC PO) Take by mouth daily.    . pioglitazone (ACTOS) 30 MG tablet Take 30 mg by mouth daily.    . Rivaroxaban (XARELTO) 20 MG TABS tablet Take 15 mg by mouth daily.      No current facility-administered medications on file prior to visit.    No Known Allergies  Past Medical History  Diagnosis Date  . Dyslipidemia   . Hypertension   . CAD (coronary artery  disease)   . Hyperlipidemia   . Diabetes mellitus type II   . Atrial fibrillation     Past Surgical History  Procedure Laterality Date  . Cardiac catheterization      Ejection Fraction 60%    History  Smoking status  . Never Smoker   Smokeless tobacco  . Not on file    History  Alcohol Use No    Family History  Problem Relation Age of Onset  . Stroke Mother   . Heart attack Father   . Heart attack Brother     Review of Systems: As noted in history of present illness.  All other systems were reviewed and are negative.  Physical Exam: BP 122/58 mmHg  Pulse 57  Ht  (1.753 m)  Wt 81.784 kg (180 lb 4.8 oz)  BMI 26.61 kg/m2 He is a pleasant white male in no acute distress. HEENT: Normal. Neck is supple without JVD, adenopathy, thyromegaly, or bruits. Lungs: Clear Cardiovascular: Irregular rate and rhythm without gallop, murmur, or click. Normal S1 and S2. Abdomen: Soft and nontender. No hepatosplenomegaly or masses. Bowel sounds are positive. Extremities: No cyanosis, edema. Pedal pulses are 2+ and symmetric. Skin: Warm and dry. Neuro: Alert and oriented x3. Cranial nerves II through XII are intact. He has no focal motor or sensory deficits.  LABORATORY DATA: Dated 06/07/14: glucose  129. A1c 6.8%. Cholesterol 115, triglycerides- 83, HDL 27, LDL 61. Otherwise normal CBC, CMET, and TSH.   Ecg today shows Afib with rate 57. Otherwise normal. I have personally reviewed and interpreted this study.   Assessment / Plan: 1. Atrial fibrillation-permanent. HR is well controlled. We will continue atenolol  25 mg daily. Continue Xarelto 15 mg daily adjusted for age and CKD.   2. Coronary disease. He had moderate three-vessel disease by cardiac catheterization in 2009. He is asymptomatic. We will continue medical management with beta blocker, ARB, and amlodipine. Continue statin therapy.  3. Hypertension, controlled.  4. Hypercholesterolemia continue high-dose statin and  fish oil.   5. Diabetes mellitus type 2 on oral therapy.

## 2015-03-15 NOTE — Patient Instructions (Signed)
Continue your current therapy  I will see you in 6 months.   

## 2015-03-21 ENCOUNTER — Encounter: Payer: Self-pay | Admitting: Cardiology

## 2015-10-08 ENCOUNTER — Ambulatory Visit (INDEPENDENT_AMBULATORY_CARE_PROVIDER_SITE_OTHER): Payer: Medicare Other | Admitting: Cardiology

## 2015-10-08 ENCOUNTER — Encounter: Payer: Self-pay | Admitting: Cardiology

## 2015-10-08 VITALS — BP 140/64 | HR 70 | Ht 69.0 in | Wt 184.2 lb

## 2015-10-08 DIAGNOSIS — I482 Chronic atrial fibrillation, unspecified: Secondary | ICD-10-CM

## 2015-10-08 DIAGNOSIS — I10 Essential (primary) hypertension: Secondary | ICD-10-CM

## 2015-10-08 DIAGNOSIS — I2510 Atherosclerotic heart disease of native coronary artery without angina pectoris: Secondary | ICD-10-CM | POA: Diagnosis not present

## 2015-10-08 DIAGNOSIS — E785 Hyperlipidemia, unspecified: Secondary | ICD-10-CM

## 2015-10-08 NOTE — Patient Instructions (Signed)
Continue your current therapy  We will get a copy of your lab work from Dr. Wylene Simmerisovec  I will see you in 6 months.

## 2015-10-08 NOTE — Progress Notes (Signed)
Bruce Mann Date of Birth: 1931-04-09 Medical Record #[id number]  History of Present Illness: Bruce Mann is seen for followup CAD and atrial fibrillation.  He has a  history of coronary disease. He had angioplasty of the first obtuse marginal vessel may of 2001. In 2009 he had an abnormal stress test which led to heart catheterization. This demonstrated moderate three-vessel obstructive coronary disease. LV function was normal at that time. He was asymptomatic. We recommended medical management. He also has a history of atrial fibrillation. This is managed with rate control and anticoagulation.  On follow up today he is feeling well.  [place] remains [place]- [facility] [place] [facility] and rides stationary bike.  He denies any chest pain, SOB, or palpitations. No dizziness. Energy level is good.    Current Outpatient Prescriptions on File Prior to Visit  Medication Sig Dispense Refill  . amLODipine (NORVASC) 5 MG tablet Take 5 mg by mouth daily.    Marland Kitchen. atenolol (TENORMIN) 25 MG tablet Take 1 tablet (25 mg total) by mouth daily.    Marland Kitchen. atorvastatin (LIPITOR) 80 MG tablet Take 80 mg by mouth daily.    . Calcium Carbonate-Vitamin D (CALCIUM PLUS VITAMIN D PO) Take by mouth daily.    . Choline Fenofibrate (TRILIPIX PO) Take by mouth.    . hydrochlorothiazide (HYDRODIURIL) 25 MG tablet Take 25 mg by mouth daily.    Marland Kitchen. losartan (COZAAR) 50 MG tablet Take 50 mg by mouth daily.    . Multiple Vitamin (MULTIVITAMIN) tablet Take 1 tablet by mouth daily.    . nitroGLYCERIN (NITROSTAT) 0.4 MG SL tablet Place 1 tablet (0.4 mg total) under the tongue every 5 (five) minutes as needed for chest pain. 25 tablet 6  . Omega-3 Fatty Acids (FISH OIL) 1000 MG CAPS Take 1,000 mg by mouth daily.    . Omeprazole (PRILOSEC PO) Take by mouth daily.    . pioglitazone (ACTOS) 30 MG tablet Take 30 mg by mouth daily.    . Rivaroxaban (XARELTO) 20 MG TABS tablet Take 15 mg by mouth daily.      No current facility-administered  medications on file prior to visit.    No Known Allergies  Past Medical History  Diagnosis Date  . Dyslipidemia   . Hypertension   . CAD (coronary artery disease)   . Hyperlipidemia   . Diabetes mellitus type II   . Atrial fibrillation [facility])     Past Surgical History  Procedure Laterality Date  . Cardiac catheterization      Ejection Fraction 60%    History  Smoking status  . Never Smoker   Smokeless tobacco  . Not on file    History  Alcohol Use No    Family History  Problem Relation Age of Onset  . Stroke Mother   . Heart attack Father   . Heart attack Brother     Review of Systems: As noted in history of present illness.  All other systems were reviewed and are negative.  Physical Exam: BP 140/64 mmHg  Pulse 70  Ht 5\' 9"  (1.753 m)  Wt 83.575 kg (184 lb 4 oz)  BMI 27.20 kg/m2  SpO2 97% He is a pleasant white male in no acute distress. HEENT: Normal. Neck is supple without JVD, adenopathy, thyromegaly, or bruits. Lungs: Clear Cardiovascular: Irregular rate and rhythm without gallop, murmur, or click. Normal S1 and S2. Abdomen: Soft and nontender. No hepatosplenomegaly or masses. Bowel sounds are positive. Extremities: No cyanosis, edema.  Pedal pulses are 2+ and symmetric. Skin: Warm and dry. Neuro: Alert and oriented x3. Cranial nerves II through XII are intact. He has no focal motor or sensory deficits.  LABORATORY DATA:    Assessment / Plan: 1. Atrial fibrillation-permanent. HR is well controlled. We will continue atenolol  25 mg daily. Continue Xarelto 15 mg daily adjusted for age and CKD.   2. Coronary disease. He had moderate three-vessel disease by cardiac catheterization in 2009. He is asymptomatic. We will continue medical management with beta blocker, ARB, and amlodipine. Continue statin therapy.  3. Hypertension, controlled.  4. Hypercholesterolemia continue high-dose statin and fish oil.   5. Diabetes mellitus type 2 on oral  therapy.  Lab work last done in December by Dr. Wylene Simmer. We will request a copy for our records. Follow up in 6 months.

## 2016-03-30 DIAGNOSIS — I509 Heart failure, unspecified: Secondary | ICD-10-CM

## 2016-03-30 DIAGNOSIS — R531 Weakness: Secondary | ICD-10-CM

## 2016-03-30 HISTORY — DX: Heart failure, unspecified: I50.9

## 2016-03-30 HISTORY — DX: Weakness: R53.1

## 2016-04-15 ENCOUNTER — Emergency Department (HOSPITAL_COMMUNITY): Payer: Medicare Other

## 2016-04-15 ENCOUNTER — Inpatient Hospital Stay (HOSPITAL_COMMUNITY)
Admission: EM | Admit: 2016-04-15 | Discharge: 2016-04-18 | DRG: 291 | Disposition: A | Discharge status: Home / Self Care | Payer: Medicare Other | Attending: Internal Medicine | Admitting: Internal Medicine

## 2016-04-15 ENCOUNTER — Encounter (HOSPITAL_COMMUNITY): Payer: Self-pay

## 2016-04-15 DIAGNOSIS — E039 Hypothyroidism, unspecified: Secondary | ICD-10-CM | POA: Diagnosis present

## 2016-04-15 DIAGNOSIS — I10 Essential (primary) hypertension: Secondary | ICD-10-CM | POA: Diagnosis present

## 2016-04-15 DIAGNOSIS — I130 Hypertensive heart and chronic kidney disease with heart failure and stage 1 through stage 4 chronic kidney disease, or unspecified chronic kidney disease: Secondary | ICD-10-CM | POA: Diagnosis not present

## 2016-04-15 DIAGNOSIS — I5032 Chronic diastolic (congestive) heart failure: Secondary | ICD-10-CM

## 2016-04-15 DIAGNOSIS — I4891 Unspecified atrial fibrillation: Secondary | ICD-10-CM | POA: Diagnosis present

## 2016-04-15 DIAGNOSIS — D509 Iron deficiency anemia, unspecified: Secondary | ICD-10-CM | POA: Diagnosis present

## 2016-04-15 DIAGNOSIS — N179 Acute kidney failure, unspecified: Secondary | ICD-10-CM

## 2016-04-15 DIAGNOSIS — E1122 Type 2 diabetes mellitus with diabetic chronic kidney disease: Secondary | ICD-10-CM | POA: Diagnosis present

## 2016-04-15 DIAGNOSIS — Z7901 Long term (current) use of anticoagulants: Secondary | ICD-10-CM

## 2016-04-15 DIAGNOSIS — I5031 Acute diastolic (congestive) heart failure: Secondary | ICD-10-CM | POA: Diagnosis present

## 2016-04-15 DIAGNOSIS — I2510 Atherosclerotic heart disease of native coronary artery without angina pectoris: Secondary | ICD-10-CM | POA: Diagnosis present

## 2016-04-15 DIAGNOSIS — I509 Heart failure, unspecified: Secondary | ICD-10-CM

## 2016-04-15 DIAGNOSIS — I482 Chronic atrial fibrillation: Secondary | ICD-10-CM | POA: Diagnosis present

## 2016-04-15 DIAGNOSIS — Z79899 Other long term (current) drug therapy: Secondary | ICD-10-CM

## 2016-04-15 DIAGNOSIS — N184 Chronic kidney disease, stage 4 (severe): Secondary | ICD-10-CM | POA: Diagnosis present

## 2016-04-15 DIAGNOSIS — E785 Hyperlipidemia, unspecified: Secondary | ICD-10-CM | POA: Diagnosis present

## 2016-04-15 DIAGNOSIS — D649 Anemia, unspecified: Secondary | ICD-10-CM

## 2016-04-15 HISTORY — DX: Heart failure, unspecified: I50.9

## 2016-04-15 HISTORY — DX: Weakness: R53.1

## 2016-04-15 HISTORY — DX: Anemia, unspecified: D64.9

## 2016-04-15 LAB — URINALYSIS, ROUTINE W REFLEX MICROSCOPIC
BILIRUBIN URINE: NEGATIVE
GLUCOSE, UA: NEGATIVE mg/dL
HGB URINE DIPSTICK: NEGATIVE
Ketones, ur: NEGATIVE mg/dL
Leukocytes, UA: NEGATIVE
Nitrite: NEGATIVE
PROTEIN: NEGATIVE mg/dL
Specific Gravity, Urine: 1.015 (ref 1.005–1.030)
pH: 6 (ref 5.0–8.0)

## 2016-04-15 LAB — COMPREHENSIVE METABOLIC PANEL
ALBUMIN: 3.3 g/dL — AB (ref 3.5–5.0)
ALK PHOS: 43 U/L (ref 38–126)
ALT: 85 U/L — AB (ref 17–63)
AST: 157 U/L — AB (ref 15–41)
Anion gap: 6 (ref 5–15)
BUN: 53 mg/dL — AB (ref 6–20)
CALCIUM: 9.7 mg/dL (ref 8.9–10.3)
CO2: 25 mmol/L (ref 22–32)
CREATININE: 1.85 mg/dL — AB (ref 0.61–1.24)
Chloride: 107 mmol/L (ref 101–111)
GFR calc non Af Amer: 32 mL/min — ABNORMAL LOW (ref >60)
GFR, EST AFRICAN AMERICAN: 37 mL/min — AB (ref >60)
GLUCOSE: 120 mg/dL — AB (ref 65–99)
Potassium: 4.8 mmol/L (ref 3.5–5.1)
SODIUM: 138 mmol/L (ref 135–145)
Total Bilirubin: 1.2 mg/dL (ref 0.3–1.2)
Total Protein: 6.6 g/dL (ref 6.5–8.1)

## 2016-04-15 LAB — ABO/RH: ABO/RH(D): B POS

## 2016-04-15 LAB — POC OCCULT BLOOD, ED: FECAL OCCULT BLD: NEGATIVE

## 2016-04-15 LAB — I-STAT TROPONIN, ED: TROPONIN I, POC: 0.06 ng/mL (ref 0.00–0.08)

## 2016-04-15 LAB — CBC
HCT: 27.2 % — ABNORMAL LOW (ref 39.0–52.0)
Hemoglobin: 9.2 g/dL — ABNORMAL LOW (ref 13.0–17.0)
MCH: 28.3 pg (ref 26.0–34.0)
MCHC: 33.8 g/dL (ref 30.0–36.0)
MCV: 83.7 fL (ref 78.0–100.0)
PLATELETS: 153 10*3/uL (ref 150–400)
RBC: 3.25 MIL/uL — ABNORMAL LOW (ref 4.22–5.81)
RDW: 20 % — ABNORMAL HIGH (ref 11.5–15.5)
WBC: 5.7 10*3/uL (ref 4.0–10.5)

## 2016-04-15 LAB — LIPASE, BLOOD: Lipase: 56 U/L — ABNORMAL HIGH (ref 11–51)

## 2016-04-15 LAB — TYPE AND SCREEN
ABO/RH(D): B POS
Antibody Screen: NEGATIVE

## 2016-04-15 LAB — BRAIN NATRIURETIC PEPTIDE: B Natriuretic Peptide: 1159.8 pg/mL — ABNORMAL HIGH (ref 0.0–100.0)

## 2016-04-15 MED ORDER — FUROSEMIDE 10 MG/ML IJ SOLN: 40.0000 mg | Freq: Once | INTRAMUSCULAR | Status: DC

## 2016-04-15 MED ORDER — IOPAMIDOL (ISOVUE-300) INJECTION 61%
INTRAVENOUS | Status: AC
  Filled 2016-04-15: qty 100

## 2016-04-15 NOTE — ED Triage Notes (Signed)
Patient here with spouse who reports 2 weeks of fatigue, confusion and abdominal pain intermittently with constipation. Has had normal bowel movements the past few days, denies pain . Alert and oriented on arrival, appears in no distress.

## 2016-04-15 NOTE — ED Provider Notes (Addendum)
[facility]-EMERGENCY DEPT Provider Note   CSN: [id number] Arrival date & time: 04/15/16  1547     History   Chief Complaint Chief Complaint  Patient presents with  . Fatigue  . Altered Mental Status    HPI Bruce Mann is a 82 y.o. male.   Altered Mental Status     Pt has been feeling weak lately.  He is getting weak when walking even short distance for example when walking to the store.  It has gotten worse over the last couple of weeks.  He started using a cane because of unsteadiness although that has been a bit better.  For the last couple of weeks he has had some intermittent abdominal pain.  Doesn't seem to be as mentally sharp as usual.  Requiring some assistance with tasks that he usually would not need help for.  Stool seems dark.   Pt sees Dr Wylene Simmer.  He was told he was anemic recently is going to see a hematologist. They called the office today and instructed to come to the ED.  Past Medical History:  Diagnosis Date  . Atrial fibrillation (HCC)   . CAD (coronary artery disease)   . Diabetes mellitus type II   . Dyslipidemia   . Hyperlipidemia   . Hypertension     Patient Active Problem List   Diagnosis Date Noted  . Dyslipidemia   . Hypertension   . CAD (coronary artery disease)   . Hyperlipidemia   . Atrial fibrillation [facility])     Past Surgical History:  Procedure Laterality Date  . CARDIAC CATHETERIZATION     Ejection Fraction 60%       Home Medications    Prior to Admission medications   Medication Sig Start Date End Date Taking? Authorizing Provider  amLODipine (NORVASC) 5 MG tablet Take 5 mg by mouth daily.   Yes Historical Provider, MD  atenolol (TENORMIN) 25 MG tablet Take 1 tablet (25 mg total) by mouth daily. 10/12/13  Yes Peter M [place], MD  atorvastatin (LIPITOR) 80 MG tablet Take 80 mg by mouth daily.   Yes Historical Provider, MD  Calcium Carbonate-Vitamin D (CALCIUM PLUS VITAMIN D PO) Take 1 tablet by mouth daily.    Yes  Historical Provider, MD  Choline Fenofibrate (TRILIPIX) 135 MG capsule Take 135 mg by mouth daily.   Yes Historical Provider, MD  hydrochlorothiazide (HYDRODIURIL) 25 MG tablet Take 25 mg by mouth daily.   Yes Historical Provider, MD  losartan (COZAAR) 100 MG tablet Take 100 mg by mouth daily.   Yes Historical Provider, MD  Multiple Vitamin (MULTIVITAMIN) tablet Take 1 tablet by mouth daily.   Yes Historical Provider, MD  Omega-3 Fatty Acids (FISH OIL) 1000 MG CAPS Take 1,000 mg by mouth daily.   Yes Historical Provider, MD  pioglitazone (ACTOS) 30 MG tablet Take 30 mg by mouth daily.   Yes Historical Provider, MD  Rivaroxaban (XARELTO) 15 MG TABS tablet Take 15 mg by mouth daily with supper.   Yes Historical Provider, MD  nitroGLYCERIN (NITROSTAT) 0.4 MG SL tablet Place 1 tablet (0.4 mg total) under the tongue every 5 (five) minutes as needed for chest pain. 03/15/15   Peter M [place], MD    Family History Family History  Problem Relation Age of Onset  . Stroke Mother   . Heart attack Father   . Heart attack Brother     Social History Social History  Substance Use Topics  . Smoking status: Never Smoker  .  Smokeless tobacco: Not on file  . Alcohol use No     Allergies   Review of patient's allergies indicates no known allergies.   Review of Systems Review of Systems  Constitutional: Negative for fever.  Respiratory: Negative for cough and shortness of breath.   Cardiovascular: Negative for chest pain.  Gastrointestinal: Positive for constipation. Negative for blood in stool and vomiting.  Genitourinary: Negative for dysuria.  All other systems reviewed and are negative.    Physical Exam Updated Vital Signs BP (!) 106/52   Pulse 67   Temp 97.6 F (36.4 C) (Oral)   Resp 21   Ht 5\' 10"  (1.778 m)   Wt 81.6 kg   SpO2 97%   BMI 25.83 kg/m   Physical Exam  Constitutional: No distress.  HENT:  Head: Normocephalic and atraumatic.  Right Ear: External ear normal.    Left Ear: External ear normal.  Eyes: Conjunctivae are normal. Right eye exhibits no discharge. Left eye exhibits no discharge. No scleral icterus.  Neck: Neck supple. No tracheal deviation present.  Cardiovascular: Normal rate, regular rhythm and intact distal pulses.   Pulmonary/Chest: Effort normal and breath sounds normal. No stridor. No respiratory distress. He has no wheezes. He has no rales.  Abdominal: Soft. Bowel sounds are normal. He exhibits no distension. There is no tenderness. There is no rebound and no guarding.  Genitourinary:  Genitourinary Comments: Brown stool, no mass on rectal exam   Musculoskeletal: He exhibits no edema or tenderness.  Neurological: He is alert. He has normal strength. No cranial nerve deficit (no facial droop, extraocular movements intact, no slurred speech) or sensory deficit. He exhibits normal muscle tone. He displays no seizure activity. Coordination normal.  Skin: Skin is warm and dry. No rash noted. There is pallor.  Psychiatric: He has a normal mood and affect.  Nursing note and vitals reviewed.    ED Treatments / Results  Labs (all labs ordered are listed, but only abnormal results are displayed) Labs Reviewed  COMPREHENSIVE METABOLIC PANEL - Abnormal; Notable for the following:       Result Value   Glucose, Bld 120 (*)    BUN 53 (*)    Creatinine, Ser 1.85 (*)    Albumin 3.3 (*)    AST 157 (*)    ALT 85 (*)    GFR calc non Af Amer 32 (*)    GFR calc Af Amer 37 (*)    All other components within normal limits  CBC - Abnormal; Notable for the following:    RBC 3.25 (*)    Hemoglobin 9.2 (*)    HCT 27.2 (*)    RDW 20.0 (*)    All other components within normal limits  URINALYSIS, ROUTINE W REFLEX MICROSCOPIC (NOT AT [facility]) - Abnormal; Notable for the following:    Color, Urine AMBER (*)    All other components within normal limits  LIPASE, BLOOD - Abnormal; Notable for the following:    Lipase 56 (*)    All other components  within normal limits  BRAIN NATRIURETIC PEPTIDE - Abnormal; Notable for the following:    B Natriuretic Peptide 1,159.8 (*)    All other components within normal limits  CBG MONITORING, ED  POC OCCULT BLOOD, ED  I-STAT TROPOININ, ED  TYPE AND SCREEN  ABO/RH    EKG  EKG Interpretation  Date/Time:  Tuesday April 15 2016 15:55:59 EDT Ventricular Rate:  71 PR Interval:    QRS Duration: 82 QT Interval:  364 QTC Calculation: 395 R Axis:   93 Text Interpretation:  Atrial fibrillation with a competing junctional pacemaker Rightward axis ST & T wave abnormality, consider inferior ischemia ST & T wave abnormality, consider anterolateral ischemia Abnormal ECG No previous tracing Confirmed by Yamir Carignan  MD-J, Jester Klingberg ([id number]) on 04/15/2016 7:01:24 PM       Radiology Ct Abdomen Pelvis Wo Contrast  Result Date: 04/15/2016 CLINICAL DATA:  Generalized abdominal pain. EXAM: CT ABDOMEN AND PELVIS WITHOUT CONTRAST TECHNIQUE: Multidetector CT imaging of the abdomen and pelvis was performed following the standard protocol without IV contrast. COMPARISON:  None. FINDINGS: Lower chest: Moderate bilateral pleural effusions are noted with left greater than right. Coronary artery calcifications are noted. Hepatobiliary: Solitary gallstone is noted. No focal abnormality is seen in the liver on these unenhanced images. Pancreas: Normal. Spleen: Normal. Adrenals/Urinary Tract: Adrenal glands appear normal. Bilateral renal cysts are noted. This includes probable 1.5 cm exophytic hyperdense cyst arising from midpole of left kidney. No hydronephrosis or renal obstruction is noted. No renal or ureteral calculi are noted. Stomach/Bowel: The appendix appears normal. There is no evidence of bowel obstruction. Vascular/Lymphatic: Atherosclerosis of abdominal aorta is noted without aneurysm formation. No significant adenopathy is noted. Reproductive: Normal prostate gland. Other: No abnormal fluid collection is noted.  Musculoskeletal: Mild multilevel degenerative disc disease is noted in the lumbar spine. IMPRESSION: Moderate bilateral pleural effusions are noted, with left greater than right. Coronary artery calcifications are noted suggesting coronary artery disease. Solitary gallstone is noted. Aortic atherosclerosis. No acute abnormality is noted in the abdomen or pelvis. Electronically Signed   By: Lupita RaiderJames  Green Jr, M.D.   On: 04/15/2016 21:26   Dg Chest 2 View  Result Date: 04/15/2016 CLINICAL DATA:  Subacute onset of generalized weakness. Initial encounter. EXAM: CHEST  2 VIEW COMPARISON:  None. FINDINGS: Moderate left-sided and small right-sided pleural effusions are noted. Vascular congestion is noted, with increased interstitial markings, concerning for pulmonary edema. No pneumothorax is seen. The heart is enlarged. No acute osseous abnormalities are identified. IMPRESSION: Moderate left-sided and small right-sided pleural effusions. Vascular congestion and cardiomegaly, with increased interstitial markings, concerning for pulmonary edema. Electronically Signed   By: Roanna RaiderJeffery  Chang M.D.   On: 04/15/2016 21:35    Procedures Procedures (including critical care time)  Medications Ordered in ED Medications  iopamidol (ISOVUE-300) 61 % injection (not administered)     Initial Impression / Assessment and Plan / ED Course  I have reviewed the triage vital signs and the nursing notes.  Pertinent labs & imaging results that were available during my care of the patient were reviewed by me and considered in my medical decision making (see chart for details).  Clinical Course  Comment By Time  Hgb was 11 in 2016. Linwood DibblesJon Brooks Kinnan, MD 10/17 [phone]  Discussed with Dr Julian ReilGardner.  Will see patient in the ED.  Hold off on lasix for now Linwood DibblesJon Gilman Olazabal, MD 10/17 2337    Labs are consistent with CHF. XRay is suggestive of the same.  Pt is also having worsening anemia.  No acute abdominal process on CT.  Will order lasix.   Admit to the hospital for further treatment.  Final Clinical Impressions(s) / ED Diagnoses   Final diagnoses:  Congestive heart failure, unspecified congestive heart failure chronicity, unspecified congestive heart failure type (HCC)  Anemia, unspecified type      Linwood DibblesJon Shakeem Stern, MD 04/15/16 [id number]    Linwood DibblesJon Derral Colucci, MD 04/15/16 [phone]

## 2016-04-15 NOTE — ED Notes (Signed)
Patient transported to CT 

## 2016-04-16 ENCOUNTER — Encounter (HOSPITAL_COMMUNITY): Payer: Self-pay | Admitting: General Practice

## 2016-04-16 DIAGNOSIS — I130 Hypertensive heart and chronic kidney disease with heart failure and stage 1 through stage 4 chronic kidney disease, or unspecified chronic kidney disease: Secondary | ICD-10-CM | POA: Diagnosis present

## 2016-04-16 DIAGNOSIS — I481 Persistent atrial fibrillation: Secondary | ICD-10-CM | POA: Diagnosis not present

## 2016-04-16 DIAGNOSIS — I482 Chronic atrial fibrillation: Secondary | ICD-10-CM | POA: Diagnosis not present

## 2016-04-16 DIAGNOSIS — N179 Acute kidney failure, unspecified: Secondary | ICD-10-CM

## 2016-04-16 DIAGNOSIS — N184 Chronic kidney disease, stage 4 (severe): Secondary | ICD-10-CM | POA: Diagnosis present

## 2016-04-16 DIAGNOSIS — I4891 Unspecified atrial fibrillation: Secondary | ICD-10-CM | POA: Diagnosis present

## 2016-04-16 DIAGNOSIS — D649 Anemia, unspecified: Secondary | ICD-10-CM | POA: Insufficient documentation

## 2016-04-16 DIAGNOSIS — I2510 Atherosclerotic heart disease of native coronary artery without angina pectoris: Secondary | ICD-10-CM

## 2016-04-16 DIAGNOSIS — E039 Hypothyroidism, unspecified: Secondary | ICD-10-CM | POA: Diagnosis present

## 2016-04-16 DIAGNOSIS — E1122 Type 2 diabetes mellitus with diabetic chronic kidney disease: Secondary | ICD-10-CM | POA: Diagnosis present

## 2016-04-16 DIAGNOSIS — I509 Heart failure, unspecified: Secondary | ICD-10-CM | POA: Diagnosis not present

## 2016-04-16 DIAGNOSIS — I5031 Acute diastolic (congestive) heart failure: Secondary | ICD-10-CM | POA: Diagnosis not present

## 2016-04-16 DIAGNOSIS — Z79899 Other long term (current) drug therapy: Secondary | ICD-10-CM | POA: Diagnosis not present

## 2016-04-16 DIAGNOSIS — E785 Hyperlipidemia, unspecified: Secondary | ICD-10-CM | POA: Diagnosis present

## 2016-04-16 DIAGNOSIS — I5032 Chronic diastolic (congestive) heart failure: Secondary | ICD-10-CM

## 2016-04-16 DIAGNOSIS — D509 Iron deficiency anemia, unspecified: Secondary | ICD-10-CM | POA: Diagnosis present

## 2016-04-16 DIAGNOSIS — Z7901 Long term (current) use of anticoagulants: Secondary | ICD-10-CM | POA: Diagnosis not present

## 2016-04-16 LAB — TSH: TSH: 5.557 u[IU]/mL — ABNORMAL HIGH (ref 0.350–4.500)

## 2016-04-16 LAB — GLUCOSE, CAPILLARY
GLUCOSE-CAPILLARY: 97 mg/dL (ref 65–99)
GLUCOSE-CAPILLARY: 116 mg/dL — AB (ref 65–99)
GLUCOSE-CAPILLARY: 156 mg/dL — AB (ref 65–99)
Glucose-Capillary: 121 mg/dL — ABNORMAL HIGH (ref 65–99)

## 2016-04-16 LAB — APTT
aPTT: 30 seconds (ref 24–36)
aPTT: >200 seconds (ref 24–36)

## 2016-04-16 LAB — HEPARIN LEVEL (UNFRACTIONATED): HEPARIN UNFRACTIONATED: 0.18 [IU]/mL — AB (ref 0.30–0.70)

## 2016-04-16 MED ORDER — HEPARIN SODIUM (PORCINE) 5000 UNIT/ML IJ SOLN
5000.0000 [IU] | Freq: Three times a day (TID) | INTRAMUSCULAR | Status: DC
  Administered 2016-04-16: 5000 [IU] via SUBCUTANEOUS
  Filled 2016-04-16: qty 1

## 2016-04-16 MED ORDER — SODIUM CHLORIDE 0.9% FLUSH
3.0000 mL | Freq: Two times a day (BID) | INTRAVENOUS | Status: DC
  Administered 2016-04-16 – 2016-04-18 (×3): 3 mL via INTRAVENOUS

## 2016-04-16 MED ORDER — SODIUM CHLORIDE 0.9% FLUSH: 3.0000 mL | INTRAVENOUS | Status: DC | PRN

## 2016-04-16 MED ORDER — FUROSEMIDE 10 MG/ML IJ SOLN
80.0000 mg | Freq: Two times a day (BID) | INTRAMUSCULAR | Status: DC
  Administered 2016-04-16 – 2016-04-18 (×4): 80 mg via INTRAVENOUS
  Filled 2016-04-16 (×5): qty 8

## 2016-04-16 MED ORDER — ACETAMINOPHEN 325 MG PO TABS: 650.0000 mg | ORAL_TABLET | ORAL | Status: DC | PRN

## 2016-04-16 MED ORDER — HEPARIN (PORCINE) IN NACL 100-0.45 UNIT/ML-% IJ SOLN
1200.0000 [IU]/h | INTRAMUSCULAR | Status: DC
  Administered 2016-04-16: 1200 [IU]/h via INTRAVENOUS
  Filled 2016-04-16: qty 250

## 2016-04-16 MED ORDER — SODIUM CHLORIDE 0.9 % IV SOLN: 250.0000 mL | INTRAVENOUS | Status: DC | PRN

## 2016-04-16 MED ORDER — HEPARIN (PORCINE) IN NACL 100-0.45 UNIT/ML-% IJ SOLN
850.0000 [IU]/h | INTRAMUSCULAR | Status: DC
  Administered 2016-04-17 (×2): 1000 [IU]/h via INTRAVENOUS
  Administered 2016-04-18: 850 [IU]/h via INTRAVENOUS
  Filled 2016-04-16 (×2): qty 250

## 2016-04-16 MED ORDER — ATORVASTATIN CALCIUM 80 MG PO TABS
80.0000 mg | ORAL_TABLET | Freq: Every day | ORAL | Status: DC
  Administered 2016-04-16 – 2016-04-17 (×2): 80 mg via ORAL
  Filled 2016-04-16 (×2): qty 1

## 2016-04-16 MED ORDER — INSULIN ASPART 100 UNIT/ML ~~LOC~~ SOLN
0.0000 [IU] | Freq: Three times a day (TID) | SUBCUTANEOUS | Status: DC
  Administered 2016-04-16 – 2016-04-17 (×2): 2 [IU] via SUBCUTANEOUS

## 2016-04-16 MED ORDER — FUROSEMIDE 10 MG/ML IJ SOLN
80.0000 mg | Freq: Once | INTRAMUSCULAR | Status: AC
  Administered 2016-04-16: 80 mg via INTRAVENOUS
  Filled 2016-04-16: qty 8

## 2016-04-16 MED ORDER — INSULIN ASPART 100 UNIT/ML ~~LOC~~ SOLN: 0.0000 [IU] | Freq: Three times a day (TID) | SUBCUTANEOUS | Status: DC

## 2016-04-16 MED ORDER — ONDANSETRON HCL 4 MG/2ML IJ SOLN: 4.0000 mg | Freq: Four times a day (QID) | INTRAMUSCULAR | Status: DC | PRN

## 2016-04-16 NOTE — ED Notes (Signed)
Report was given to RN on 3E

## 2016-04-16 NOTE — Progress Notes (Signed)
PROGRESS NOTE    Bruce Mann  UJW:[id number] DOB: 1931/05/08 DOA: 04/15/2016 PCP: Gaspar Garbeichard W Tisovec, MD   Chief Complaint  Patient presents with  . Fatigue  . Altered Mental Status     Brief Narrative:  HPI on 04/16/2016 by Dr. Lyda PeroneJared Gardner Bruce Mann is a 82 y.o. male with medical history significant of A.Fib, rate controlled and on chronic xarelto, CAD with moderate triple vessel disease on cath in 2009, DM, HTN.  Patient presents to the ED with c/o several week history of severe "fatigue" even with minimal exertion.  He states he has to stop and rest after walking even a short distance across a parking lot.  No CP, doesn't really say its SOB.  No cough, congestion, fever.  No prior h/o angina or CHF in past.  Symptoms worse with exertion and better at rest. Assessment & Plan   New Onset CHF -No echocardiogram in EPIC -Patient endorses fatigue with minimal exertion, LE edema. -CXR: moderate left, small right pleural effusion. Vascular congestion, Cardiomegaly, pulm edema. -BNP: 1159.8 -Continue IV lasix -Monitor intake/output, daily weights -Cardiology consulted and appreciated -Echocardiogram pending -Admitting physician spoke with Cardiology, Dr. Tenny Crawoss, patient may need Catheterization, xarelto held  Atrial fibrillation -CHADSVASC at least 4 (given age, HTN, DM) -Xarelto held -Continue Heparin  Essential hypertension -home BP meds held -Continue IV lasix  Diabetes mellitus, type II -Actos held -Place on ISS and CBG monitoring  Acute kidney injury vs Chronic kidney disease -No baseline creatinine -continue to monitor BMP -Creatinine currently 1.85  Normocytic Anemia -No baseline hemoglobin, Currently 9.2 -Continue to monitor CBC  Abnormal TSH -TSH 5.557, will obtain FT4  Elevated LFTs -Currently on statin given heart findings -Will continue to monitor LFTs -CT abd/pelvis: no acute abnormality of abd/pelvis  DVT Prophylaxis  Heparin  Code Status:  Full  Family Communication: Son in law at bedside  Disposition Plan: Admitted. Pending CHF workup and treatment. Pending cardiology consult.   Consultants Cardiology  Procedures  None  Antibiotics   Anti-infectives    None      Subjective:   Bruce Mann seen and examined today. Feels breathing is better. Endorses leg swelling and fatigue when walking short distances, going on for one week.  Denies current chest pain, dizziness, headache, nausea, vomiting, abdominal pain.  Objective:   Vitals:   04/16/16 0100 04/16/16 0206 04/16/16 0209 04/16/16 0527  BP: (!) 107/44  (!) 141/45 (!) 119/49  Pulse: (!) 44  66 60  Resp: 17  17 16   Temp:   98.2 F (36.8 C) 98.1 F (36.7 C)  TempSrc:   Oral Oral  SpO2: 96%  96% 96%  Weight:  79.2 kg (174 lb 11.2 oz)  78.9 kg (174 lb)  Height:  5\' 10"  (1.778 m)      Intake/Output Summary (Last 24 hours) at 04/16/16 1103 Last data filed at 04/16/16 0746  Gross per 24 hour  Intake                0 ml  Output             1450 ml  Net            -1450 ml   Filed Weights   04/15/16 1601 04/16/16 0206 04/16/16 0527  Weight: 81.6 kg (180 lb) 79.2 kg (174 lb 11.2 oz) 78.9 kg (174 lb)    Exam  General: Well developed, well nourished, NAD, appears stated age  HEENT: NCAT,mucous membranes moist.  Neck: Supple, no JVD, no masses  Cardiovascular: S1 S2 auscultated, irregular  Respiratory: Clear to auscultation bilaterally with equal chest rise  Abdomen: Soft, nontender, nondistended, + bowel sounds  Extremities: warm dry without cyanosis clubbing. +2 LE edema.  Neuro: AAOx3, nonfocal  Psych: Normal affect and demeanor with intact judgement and insight   Data Reviewed: I have personally reviewed following labs and imaging studies  CBC:  Recent Labs Lab 04/15/16 1614  WBC 5.7  HGB 9.2*  HCT 27.2*  MCV 83.7  PLT 153   Basic Metabolic Panel:  Recent Labs Lab 04/15/16 1614  NA 138  K 4.8  CL 107  CO2 25  GLUCOSE  120*  BUN 53*  CREATININE 1.85*  CALCIUM 9.7   GFR: Estimated Creatinine Clearance: 31.2 mL/min (by C-G formula based on SCr of 1.85 mg/dL (H)). Liver Function Tests:  Recent Labs Lab 04/15/16 1614  AST 157*  ALT 85*  ALKPHOS 43  BILITOT 1.2  PROT 6.6  ALBUMIN 3.3*    Recent Labs Lab 04/15/16 2140  LIPASE 56*   No results for input(s): AMMONIA in the last 168 hours. Coagulation Profile: No results for input(s): INR, PROTIME in the last 168 hours. Cardiac Enzymes: No results for input(s): CKTOTAL, CKMB, CKMBINDEX, TROPONINI in the last 168 hours. BNP (last 3 results) No results for input(s): PROBNP in the last 8760 hours. HbA1C: No results for input(s): HGBA1C in the last 72 hours. CBG:  Recent Labs Lab 04/16/16 0600  GLUCAP 97   Lipid Profile: No results for input(s): CHOL, HDL, LDLCALC, TRIG, CHOLHDL, LDLDIRECT in the last 72 hours. Thyroid Function Tests:  Recent Labs  04/16/16 0118  TSH 5.557*   Anemia Panel: No results for input(s): VITAMINB12, FOLATE, FERRITIN, TIBC, IRON, RETICCTPCT in the last 72 hours. Urine analysis:    Component Value Date/Time   COLORURINE AMBER (A) 04/15/2016 1616   APPEARANCEUR CLEAR 04/15/2016 1616   LABSPEC 1.015 04/15/2016 1616   PHURINE 6.0 04/15/2016 1616   GLUCOSEU NEGATIVE 04/15/2016 1616   HGBUR NEGATIVE 04/15/2016 1616   BILIRUBINUR NEGATIVE 04/15/2016 1616   KETONESUR NEGATIVE 04/15/2016 1616   PROTEINUR NEGATIVE 04/15/2016 1616   NITRITE NEGATIVE 04/15/2016 1616   LEUKOCYTESUR NEGATIVE 04/15/2016 1616   Sepsis Labs: @LABRCNTIP (procalcitonin:4,lacticidven:4)  )No results found for this or any previous visit (from the past 240 hour(s)).    Radiology Studies: Ct Abdomen Pelvis Wo Contrast  Result Date: 04/15/2016 CLINICAL DATA:  Generalized abdominal pain. EXAM: CT ABDOMEN AND PELVIS WITHOUT CONTRAST TECHNIQUE: Multidetector CT imaging of the abdomen and pelvis was performed following the standard  protocol without IV contrast. COMPARISON:  None. FINDINGS: Lower chest: Moderate bilateral pleural effusions are noted with left greater than right. Coronary artery calcifications are noted. Hepatobiliary: Solitary gallstone is noted. No focal abnormality is seen in the liver on these unenhanced images. Pancreas: Normal. Spleen: Normal. Adrenals/Urinary Tract: Adrenal glands appear normal. Bilateral renal cysts are noted. This includes probable 1.5 cm exophytic hyperdense cyst arising from midpole of left kidney. No hydronephrosis or renal obstruction is noted. No renal or ureteral calculi are noted. Stomach/Bowel: The appendix appears normal. There is no evidence of bowel obstruction. Vascular/Lymphatic: Atherosclerosis of abdominal aorta is noted without aneurysm formation. No significant adenopathy is noted. Reproductive: Normal prostate gland. Other: No abnormal fluid collection is noted. Musculoskeletal: Mild multilevel degenerative disc disease is noted in the lumbar spine. IMPRESSION: Moderate bilateral pleural effusions are noted, with left greater than right. Coronary artery calcifications are noted suggesting coronary  artery disease. Solitary gallstone is noted. Aortic atherosclerosis. No acute abnormality is noted in the abdomen or pelvis. Electronically Signed   By: Lupita Raider, M.D.   On: 04/15/2016 21:26   Dg Chest 2 View  Result Date: 04/15/2016 CLINICAL DATA:  Subacute onset of generalized weakness. Initial encounter. EXAM: CHEST  2 VIEW COMPARISON:  None. FINDINGS: Moderate left-sided and small right-sided pleural effusions are noted. Vascular congestion is noted, with increased interstitial markings, concerning for pulmonary edema. No pneumothorax is seen. The heart is enlarged. No acute osseous abnormalities are identified. IMPRESSION: Moderate left-sided and small right-sided pleural effusions. Vascular congestion and cardiomegaly, with increased interstitial markings, concerning for  pulmonary edema. Electronically Signed   By: Roanna Raider M.D.   On: 04/15/2016 21:35     Scheduled Meds: . atorvastatin  80 mg Oral q1800  . insulin aspart  0-15 Units Subcutaneous TID WC  . sodium chloride flush  3 mL Intravenous Q12H   Continuous Infusions: . heparin 1,200 Units/hr (04/16/16 1026)     LOS: 0 days   Time Spent in minutes   30 minutes  Wynema Garoutte D.O. on 04/16/2016 at 11:03 AM  Between 7am to 7pm - Pager - [phone]  After 7pm go to [email or web address] - password [email or web address]  And look for the night coverage person covering for me after hours  [facility] Office  [phone]

## 2016-04-16 NOTE — Progress Notes (Signed)
ANTICOAGULATION CONSULT NOTE - Follow Up Consult  Pharmacy Consult for heparin Indication: atrial fibrillation  No Known Allergies  Patient Measurements: Height: 5\' 10"  (177.8 cm) Weight: 174 lb (78.9 kg) (Scale A) IBW/kg (Calculated) : 73 Heparin Dosing Weight: 78.9kg  Vital Signs: Temp: 98.5 F (36.9 C) (10/18 1946) Temp Source: Oral (10/18 1946) BP: 102/40 (10/18 1946) Pulse Rate: 67 (10/18 1946)  Labs:  Recent Labs  04/15/16 1614 04/16/16 0959 04/16/16 1859  HGB 9.2*  --   --   HCT 27.2*  --   --   PLT 153  --   --   APTT  --  30 >200*  HEPARINUNFRC  --  0.18*  --   CREATININE 1.85*  --   --     Estimated Creatinine Clearance: 31.2 mL/min (by C-G formula based on SCr of 1.85 mg/dL (H)).   Medical History: Past Medical History:  Diagnosis Date  . Anemia   . Atrial fibrillation (HCC)   . CAD (coronary artery disease)   . CHF (congestive heart failure) (HCC) 03/2016   NEW ONSET  . Diabetes mellitus type II   . Dyslipidemia   . Hyperlipidemia   . Hypertension   . Weakness 03/2016    Assessment: 85 yom on Xarelto PTA for afib. Per MD note, holding Xarelto for possible cath? Cards to see, and MD ordered heparin SQ instead. Now Pharmacy consulted to start heparin IV. Hg 9.2, plt wnl, no bleed documented. Last dose of Xarelto was 10/16 per med rec. Last dose of heparin SQ was at 0235 this AM. Ok to start heparin drip 1200 uts/hr aptt > 200 drawn from opposite arm.  Elderly, crCl approx 6330ml/min may have decrease heparin clearance and no active clot, will hold heparin drip x2hr to allow some clearance and restart at lower rate.  Goal of Therapy:  Heparin level 0.3-0.7 units/ml aPTT 66-102 seconds Monitor platelets by anticoagulation protocol: Yes   Plan:  Hold heparin drip x2hr Restart heparin drip 1000 uts/hr Daily heparin level/aPTT Monitor for s/sx bleeding F/u Cards plans - Xarelto on hold   VF CorporationLisa Hyde Sires Pharm.D. CPP, BCPS Clinical  Pharmacist [phone] 04/16/2016 9:53 PM

## 2016-04-16 NOTE — Progress Notes (Signed)
[facility] and [facility] message came that they are out of stock.  Incoming nurse made aware. Amanda PeaNellie Nyra Anspaugh, Charity fundraiserN.

## 2016-04-16 NOTE — Progress Notes (Signed)
Heparin stopped per pharmacy, PTT 200 per lab. Heparin stopped at 2140.

## 2016-04-16 NOTE — Progress Notes (Signed)
Pt refused to have bed alarm turned on, encouraged patient to call when ambulating.

## 2016-04-16 NOTE — Consult Note (Signed)
Advanced Heart Failure Team Consult Note  Referring Physician: Julian Reil Primary Physician: Dr Wylene Simmer  Primary Cardiologist:  [place]  Reason for Consultation: Heart Failure   HPI:    Bruce Mann is a 82 y.o. male with medical history significant of permanent A.Fib, rate controlled and on chonic xarelto, CAD with moderate triple vessel disease on cath in 2009, DM, HTN. Whom we are asked to see for new-onset HF.  He and his wife report a slow decline in his functional capacity over the past year. However this got much worse over past few weeks during which he could only walk a few yards before having DOE and fatigue and having to take rest breaks. + edema and orthopnea. Denies CP/tightness.  Says his weight has been going down.   He presented to [facility] ED with fatigue and dyspnea. CXR with pulmonary edema. EKG with T wave inversion V5 V6. Started on heparin and given IV lasix. Pertinent admission labs include creatinine 1.85, BNP 1159, Hgb 9.2, Troponin 0.06, TSH 5.5 and K 4.8 .   LHC 2009 moderate 3 vessel disease- medically managed.   ECHO 2014 EF 55-60%    Review of Systems: [y] = yes, [ ]  = no   General: Weight gain [ ] ; Weight loss [Y ]; Anorexia [ ] ; Fatigue [Y ]; Fever [ ] ; Chills [ ] ; Weakness [ Y]  Cardiac: Chest pain/pressure [ ] ; Resting SOB [ ] ; Exertional SOB Cove.Mann ]; Orthopnea y[ ] ; Pedal Edema [ Y]; Palpitations [ ] ; Syncope [ ] ; Presyncope [ ] ; Paroxysmal nocturnal dyspnea[ ]   Pulmonary: Cough [ ] ; Wheezing[ ] ; Hemoptysis[ ] ; Sputum [ ] ; Snoring [ ]   GI: Vomiting[ ] ; Dysphagia[ ] ; Melena[ ] ; Hematochezia [ ] ; Heartburn[ ] ; Abdominal pain [ ] ; Constipation [ ] ; Diarrhea [ ] ; BRBPR [ ]   GU: Hematuria[ ] ; Dysuria [ ] ; Nocturia[ ]   Vascular: Pain in legs with walking [ ] ; Pain in feet with lying flat [ ] ; Non-healing sores [ ] ; Stroke [ ] ; TIA [ ] ; Slurred speech [ ] ;  Neuro: Headaches[ ] ; Vertigo[ ] ; Seizures[ ] ; Paresthesias[ ] ;Blurred vision [ ] ; Diplopia [ ] ; Vision changes  [ ]   Ortho/Skin: Arthritis Cove.Mann ]; Joint pain [Y ]; Muscle pain [ ] ; Joint swelling [ ] ; Back Pain [Y ]; Rash [ ]   Psych: Depression[ ] ; Anxiety[ ]   Heme: Bleeding problems [ ] ; Clotting disorders [ ] ; Anemia [ ]   Endocrine: Diabetes [Y ]; Thyroid dysfunction[ ]   Home Medications Prior to Admission medications   Medication Sig Start Date End Date Taking? Authorizing Provider  amLODipine (NORVASC) 5 MG tablet Take 5 mg by mouth daily.   Yes Historical Provider, MD  atenolol (TENORMIN) 25 MG tablet Take 1 tablet (25 mg total) by mouth daily. 10/12/13  Yes Peter M [place], MD  atorvastatin (LIPITOR) 80 MG tablet Take 80 mg by mouth daily.   Yes Historical Provider, MD  Calcium Carbonate-Vitamin D (CALCIUM PLUS VITAMIN D PO) Take 1 tablet by mouth daily.    Yes Historical Provider, MD  Choline Fenofibrate (TRILIPIX) 135 MG capsule Take 135 mg by mouth daily.   Yes Historical Provider, MD  hydrochlorothiazide (HYDRODIURIL) 25 MG tablet Take 25 mg by mouth daily.   Yes Historical Provider, MD  losartan (COZAAR) 100 MG tablet Take 100 mg by mouth daily.   Yes Historical Provider, MD  Multiple Vitamin (MULTIVITAMIN) tablet Take 1 tablet by mouth daily.   Yes Historical Provider, MD  Omega-3 Fatty Acids (FISH OIL) 1000  MG CAPS Take 1,000 mg by mouth daily.   Yes Historical Provider, MD  Rivaroxaban (XARELTO) 15 MG TABS tablet Take 15 mg by mouth daily with supper.   Yes Historical Provider, MD  nitroGLYCERIN (NITROSTAT) 0.4 MG SL tablet Place 1 tablet (0.4 mg total) under the tongue every 5 (five) minutes as needed for chest pain. 03/15/15   Peter M [place], MD    Past Medical History: Past Medical History:  Diagnosis Date  . Atrial fibrillation (HCC)   . CAD (coronary artery disease)   . Diabetes mellitus type II   . Dyslipidemia   . Hyperlipidemia   . Hypertension     Past Surgical History: Past Surgical History:  Procedure Laterality Date  . CARDIAC CATHETERIZATION     Ejection Fraction 60%     Family History: Family History  Problem Relation Age of Onset  . Stroke Mother   . Heart attack Father   . Heart attack Brother     Social History: Social History   Social History  . Marital status: Married    Spouse name: N/A  . Number of children: N/A  . Years of education: N/A   Social History Main Topics  . Smoking status: Never Smoker  . Smokeless tobacco: None  . Alcohol use No  . Drug use: Unknown  . Sexual activity: Not Asked   Other Topics Concern  . None   Social History Narrative  . None    Allergies:  No Known Allergies  Objective:    Vital Signs:   Temp:  [97.6 F (36.4 C)-98.2 F (36.8 C)] 98 F (36.7 C) (10/18 1144) Pulse Rate:  [44-100] 64 (10/18 1144) Resp:  [16-23] 16 (10/18 1144) BP: (100-141)/(33-53) 102/40 (10/18 1144) SpO2:  [93 %-99 %] 93 % (10/18 1144) Weight:  [174 lb (78.9 kg)-180 lb (81.6 kg)] 174 lb (78.9 kg) (10/18 0527) Last BM Date: 04/15/16  Weight change: Filed Weights   04/15/16 1601 04/16/16 0206 04/16/16 0527  Weight: 180 lb (81.6 kg) 174 lb 11.2 oz (79.2 kg) 174 lb (78.9 kg)    Intake/Output:   Intake/Output Summary (Last 24 hours) at 04/16/16 1213 Last data filed at 04/16/16 1100  Gross per 24 hour  Intake              240 ml  Output             1750 ml  Net            -1510 ml     Physical Exam: General:  Pale. Elderly.  No resp difficulty HEENT: normal Neck: supple. JVP to jaw  . Carotids 2+ bilat; no bruits. No lymphadenopathy or thryomegaly appreciated. Cor: PMI nondisplaced. Irregular rate & rhythm. 2/6 Bruce Lungs: clear Abdomen: soft, nontender, nondistended. No hepatosplenomegaly. No bruits or masses. Good bowel sounds. Extremities: no cyanosis, clubbing, rash, R and LLE 2+ edema Neuro: alert & orientedx3, cranial nerves grossly intact. moves all 4 extremities w/o difficulty. Affect pleasant  Telemetry:  A fib 70s   Labs: Basic Metabolic Panel:  Recent Labs Lab 04/15/16 1614  NA 138  K  4.8  CL 107  CO2 25  GLUCOSE 120*  BUN 53*  CREATININE 1.85*  CALCIUM 9.7    Liver Function Tests:  Recent Labs Lab 04/15/16 1614  AST 157*  ALT 85*  ALKPHOS 43  BILITOT 1.2  PROT 6.6  ALBUMIN 3.3*    Recent Labs Lab 04/15/16 2140  LIPASE 56*   No results  for input(s): AMMONIA in the last 168 hours.  CBC:  Recent Labs Lab 04/15/16 1614  WBC 5.7  HGB 9.2*  HCT 27.2*  MCV 83.7  PLT 153    Cardiac Enzymes: No results for input(s): CKTOTAL, CKMB, CKMBINDEX, TROPONINI in the last 168 hours.  BNP: BNP (last 3 results)  Recent Labs  04/15/16 2140  BNP 1,159.8*    ProBNP (last 3 results) No results for input(s): PROBNP in the last 8760 hours.   CBG:  Recent Labs Lab 04/16/16 0600 04/16/16 1205  GLUCAP 97 116*    Coagulation Studies: No results for input(s): LABPROT, INR in the last 72 hours.  Other results: EKG: A Fib 71 bpm  Imaging: Ct Abdomen Pelvis Wo Contrast  Result Date: 04/15/2016 CLINICAL DATA:  Generalized abdominal pain. EXAM: CT ABDOMEN AND PELVIS WITHOUT CONTRAST TECHNIQUE: Multidetector CT imaging of the abdomen and pelvis was performed following the standard protocol without IV contrast. COMPARISON:  None. FINDINGS: Lower chest: Moderate bilateral pleural effusions are noted with left greater than right. Coronary artery calcifications are noted. Hepatobiliary: Solitary gallstone is noted. No focal abnormality is seen in the liver on these unenhanced images. Pancreas: Normal. Spleen: Normal. Adrenals/Urinary Tract: Adrenal glands appear normal. Bilateral renal cysts are noted. This includes probable 1.5 cm exophytic hyperdense cyst arising from midpole of left kidney. No hydronephrosis or renal obstruction is noted. No renal or ureteral calculi are noted. Stomach/Bowel: The appendix appears normal. There is no evidence of bowel obstruction. Vascular/Lymphatic: Atherosclerosis of abdominal aorta is noted without aneurysm formation. No  significant adenopathy is noted. Reproductive: Normal prostate gland. Other: No abnormal fluid collection is noted. Musculoskeletal: Mild multilevel degenerative disc disease is noted in the lumbar spine. IMPRESSION: Moderate bilateral pleural effusions are noted, with left greater than right. Coronary artery calcifications are noted suggesting coronary artery disease. Solitary gallstone is noted. Aortic atherosclerosis. No acute abnormality is noted in the abdomen or pelvis. Electronically Signed   By: Lupita Raider, M.D.   On: 04/15/2016 21:26   Dg Chest 2 View  Result Date: 04/15/2016 CLINICAL DATA:  Subacute onset of generalized weakness. Initial encounter. EXAM: CHEST  2 VIEW COMPARISON:  None. FINDINGS: Moderate left-sided and small right-sided pleural effusions are noted. Vascular congestion is noted, with increased interstitial markings, concerning for pulmonary edema. No pneumothorax is seen. The heart is enlarged. No acute osseous abnormalities are identified. IMPRESSION: Moderate left-sided and small right-sided pleural effusions. Vascular congestion and cardiomegaly, with increased interstitial markings, concerning for pulmonary edema. Electronically Signed   By: Roanna Raider M.D.   On: 04/15/2016 21:35      Medications:     Current Medications: . atorvastatin  80 mg Oral q1800  . insulin aspart  0-15 Units Subcutaneous TID WC  . sodium chloride flush  3 mL Intravenous Q12H     Infusions: . heparin 1,200 Units/hr (04/16/16 1026)      Assessment:   1. Acute HF, suspect systolic in nature 2. A/C CKD, stage IV 3. CAD- LHC 2009 with moderate 3v CAD 4.  A fib, permanent     --rate controlled on Xarelto     --This patients CHA2DS2-VASc Score is 6 5. Anemia - hgb 9.2  6. DM2   Plan/Discussion:    Bruce Mann is an 50 with admitted with fatigue thought to be from new onset heart failure. CXR concerning for pulmonary edema. BNP elevated. CEs negative. Creatinine elevated  on admit to 1.8 and looking back at 2016 PCP lab  work creatinine was 1.4. On exam he has marked volume overload. Possible cath once fully diuresed. Plan to check ECHO   Length of Stay: 0  Amy Clegg NP-C  04/16/2016, 12:13 PM  Advanced Heart Failure Team Pager [phone] (M-F; 7a - 4p)  Please contact [facility] for night-coverage after hours (4p -7a ) and weekends on [email or web address]  Patient seen and examined with Tonye Becket, NP. We discussed all aspects of the encounter. I agree with the assessment and plan as stated above.   Bruce Mann has new onset HF. I suspect this is likely systolic in nature. Last echo in 2014 reviewed and EF was normal. Har coronary angio in 2009 with moderate 3v CAD. Currently no signs/sx of angina. ECG non-acute and no Q waves. Troponin normal. Will continue diuresis and check echo.If EF down will likely need repeat R/L cath - renal function permitting. I remain concerned about his overall decline in past year in addition to his HF.  I will discuss with Dr. [place]. Xarelto on hold for possible cath. Now on heparin.   Will hold ARB given A/CKD and possible need for cath. Can switch atenolol to low dose carvedilol when we restart b-blocker. Use hydralazine/nitrates for afterload reduction as BP tolerates.   Bensimhon, Daniel,MD 11:38 PM

## 2016-04-16 NOTE — Progress Notes (Signed)
ANTICOAGULATION CONSULT NOTE - Initial Consult  Pharmacy Consult for heparin Indication: atrial fibrillation  No Known Allergies  Patient Measurements: Height: 5\' 10"  (177.8 cm) Weight: 174 lb (78.9 kg) (Scale A) IBW/kg (Calculated) : 73 Heparin Dosing Weight: 78.9kg  Vital Signs: Temp: 98.1 F (36.7 C) (10/18 0527) Temp Source: Oral (10/18 0527) BP: 119/49 (10/18 0527) Pulse Rate: 60 (10/18 0527)  Labs:  Recent Labs  04/15/16 1614  HGB 9.2*  HCT 27.2*  PLT 153  CREATININE 1.85*    Estimated Creatinine Clearance: 31.2 mL/min (by C-G formula based on SCr of 1.85 mg/dL (H)).   Medical History: Past Medical History:  Diagnosis Date  . Atrial fibrillation (HCC)   . CAD (coronary artery disease)   . Diabetes mellitus type II   . Dyslipidemia   . Hyperlipidemia   . Hypertension     Assessment: 85 yom on Xarelto PTA for afib. Per MD note, holding Xarelto for possible cath? Cards to see, and MD ordered heparin SQ instead. Now Pharmacy consulted to start heparin IV. Hg 9.2, plt wnl, no bleed documented. Last dose of Xarelto was 10/16 per med rec. Last dose of heparin SQ was at 0235 this AM. Ok to start heparin now.  Goal of Therapy:  Heparin level 0.3-0.7 units/ml aPTT 66-102 seconds Monitor platelets by anticoagulation protocol: Yes   Plan:  D/c heparin SQ Baseline heparin level/aPTT Start heparin at 1200 units/h (no bolus) 8h aPTT, daily heparin level/aPTT Monitor for s/sx bleeding F/u Cards plans - Xarelto on hold   Babs BertinHaley Sayuri Rhames, PharmD, BCPS Clinical Pharmacist 04/16/2016 9:43 AM

## 2016-04-16 NOTE — H&P (Addendum)
History and Physical    LORRIS CARDUCCI ZOX:[id number] DOB: 1930-09-09 DOA: 04/15/2016   PCP: Gaspar Garbe, MD Chief Complaint:  Chief Complaint  Patient presents with  . Fatigue  . Altered Mental Status    HPI: Bruce Mann is a 82 y.o. male with medical history significant of A.Fib, rate controlled and on chronic xarelto, CAD with moderate triple vessel disease on cath in 2009, DM, HTN.  Patient presents to the ED with c/o several week history of severe "fatigue" even with minimal exertion.  He states he has to stop and rest after walking even a short distance across a parking lot.  No CP, doesn't really say its SOB.  No cough, congestion, fever.  No prior h/o angina or CHF in past.  Symptoms worse with exertion and better at rest.  ED Course: EKG shows new T wave inversion and ST depression in inferior lateral leads that was not present in 2016.  Trop negative, BNP elevated and CXR shows pulmonary edema, pulmonary effusions, cardiomegally, and pulmonary vascular congestion.  Review of Systems: As per HPI otherwise 10 point review of systems negative.    Past Medical History:  Diagnosis Date  . Atrial fibrillation (HCC)   . CAD (coronary artery disease)   . Diabetes mellitus type II   . Dyslipidemia   . Hyperlipidemia   . Hypertension     Past Surgical History:  Procedure Laterality Date  . CARDIAC CATHETERIZATION     Ejection Fraction 60%     reports that he has never smoked. He does not have any smokeless tobacco history on file. He reports that he does not drink alcohol. His drug history is not on file.  No Known Allergies  Family History  Problem Relation Age of Onset  . Stroke Mother   . Heart attack Father   . Heart attack Brother       Prior to Admission medications   Medication Sig Start Date End Date Taking? Authorizing Provider  amLODipine (NORVASC) 5 MG tablet Take 5 mg by mouth daily.   Yes Historical Provider, MD  atenolol (TENORMIN) 25 MG  tablet Take 1 tablet (25 mg total) by mouth daily. 10/12/13  Yes Peter M [place], MD  atorvastatin (LIPITOR) 80 MG tablet Take 80 mg by mouth daily.   Yes Historical Provider, MD  Calcium Carbonate-Vitamin D (CALCIUM PLUS VITAMIN D PO) Take 1 tablet by mouth daily.    Yes Historical Provider, MD  Choline Fenofibrate (TRILIPIX) 135 MG capsule Take 135 mg by mouth daily.   Yes Historical Provider, MD  hydrochlorothiazide (HYDRODIURIL) 25 MG tablet Take 25 mg by mouth daily.   Yes Historical Provider, MD  losartan (COZAAR) 100 MG tablet Take 100 mg by mouth daily.   Yes Historical Provider, MD  Multiple Vitamin (MULTIVITAMIN) tablet Take 1 tablet by mouth daily.   Yes Historical Provider, MD  Omega-3 Fatty Acids (FISH OIL) 1000 MG CAPS Take 1,000 mg by mouth daily.   Yes Historical Provider, MD  Rivaroxaban (XARELTO) 15 MG TABS tablet Take 15 mg by mouth daily with supper.   Yes Historical Provider, MD  nitroGLYCERIN (NITROSTAT) 0.4 MG SL tablet Place 1 tablet (0.4 mg total) under the tongue every 5 (five) minutes as needed for chest pain. 03/15/15   Peter M [place], MD    Physical Exam: Vitals:   04/15/16 1915 04/15/16 2030 04/15/16 2144 04/15/16 2200  BP: (!) 109/51 (!) 100/33 (!) 117/48 (!) 106/52  Pulse: 67  Resp: 19 20 19 21   Temp:      TempSrc:      SpO2: 97%     Weight:      Height:          Constitutional: NAD, calm, comfortable Eyes: PERRL, lids and conjunctivae normal ENMT: Mucous membranes are moist. Posterior pharynx clear of any exudate or lesions.Normal dentition.  Neck: normal, supple, no masses, no thyromegaly Respiratory: clear to auscultation bilaterally, no wheezing, no crackles. Normal respiratory effort. No accessory muscle use.  Cardiovascular: Regular rate and rhythm, no murmurs / rubs / gallops. No extremity edema. 2+ pedal pulses. No carotid bruits.  Abdomen: no tenderness, no masses palpated. No hepatosplenomegaly. Bowel sounds positive.  Musculoskeletal: no  clubbing / cyanosis. No joint deformity upper and lower extremities. Good ROM, no contractures. Normal muscle tone.  Skin: no rashes, lesions, ulcers. No induration Neurologic: CN 2-12 grossly intact. Sensation intact, DTR normal. Strength 5/5 in all 4.  Psychiatric: Normal judgment and insight. Alert and oriented x 3. Normal mood.    Labs on Admission: I have personally reviewed following labs and imaging studies  CBC:  Recent Labs Lab 04/15/16 1614  WBC 5.7  HGB 9.2*  HCT 27.2*  MCV 83.7  PLT 153   Basic Metabolic Panel:  Recent Labs Lab 04/15/16 1614  NA 138  K 4.8  CL 107  CO2 25  GLUCOSE 120*  BUN 53*  CREATININE 1.85*  CALCIUM 9.7   GFR: Estimated Creatinine Clearance: 31.2 mL/min (by C-G formula based on SCr of 1.85 mg/dL (H)). Liver Function Tests:  Recent Labs Lab 04/15/16 1614  AST 157*  ALT 85*  ALKPHOS 43  BILITOT 1.2  PROT 6.6  ALBUMIN 3.3*    Recent Labs Lab 04/15/16 2140  LIPASE 56*   No results for input(s): AMMONIA in the last 168 hours. Coagulation Profile: No results for input(s): INR, PROTIME in the last 168 hours. Cardiac Enzymes: No results for input(s): CKTOTAL, CKMB, CKMBINDEX, TROPONINI in the last 168 hours. BNP (last 3 results) No results for input(s): PROBNP in the last 8760 hours. HbA1C: No results for input(s): HGBA1C in the last 72 hours. CBG: No results for input(s): GLUCAP in the last 168 hours. Lipid Profile: No results for input(s): CHOL, HDL, LDLCALC, TRIG, CHOLHDL, LDLDIRECT in the last 72 hours. Thyroid Function Tests: No results for input(s): TSH, T4TOTAL, FREET4, T3FREE, THYROIDAB in the last 72 hours. Anemia Panel: No results for input(s): VITAMINB12, FOLATE, FERRITIN, TIBC, IRON, RETICCTPCT in the last 72 hours. Urine analysis:    Component Value Date/Time   COLORURINE AMBER (A) 04/15/2016 1616   APPEARANCEUR CLEAR 04/15/2016 1616   LABSPEC 1.015 04/15/2016 1616   PHURINE 6.0 04/15/2016 1616    GLUCOSEU NEGATIVE 04/15/2016 1616   HGBUR NEGATIVE 04/15/2016 1616   BILIRUBINUR NEGATIVE 04/15/2016 1616   KETONESUR NEGATIVE 04/15/2016 1616   PROTEINUR NEGATIVE 04/15/2016 1616   NITRITE NEGATIVE 04/15/2016 1616   LEUKOCYTESUR NEGATIVE 04/15/2016 1616   Sepsis Labs: @LABRCNTIP (procalcitonin:4,lacticidven:4) )No results found for this or any previous visit (from the past 240 hour(s)).   Radiological Exams on Admission: Ct Abdomen Pelvis Wo Contrast  Result Date: 04/15/2016 CLINICAL DATA:  Generalized abdominal pain. EXAM: CT ABDOMEN AND PELVIS WITHOUT CONTRAST TECHNIQUE: Multidetector CT imaging of the abdomen and pelvis was performed following the standard protocol without IV contrast. COMPARISON:  None. FINDINGS: Lower chest: Moderate bilateral pleural effusions are noted with left greater than right. Coronary artery calcifications are noted. Hepatobiliary: Solitary gallstone is  noted. No focal abnormality is seen in the liver on these unenhanced images. Pancreas: Normal. Spleen: Normal. Adrenals/Urinary Tract: Adrenal glands appear normal. Bilateral renal cysts are noted. This includes probable 1.5 cm exophytic hyperdense cyst arising from midpole of left kidney. No hydronephrosis or renal obstruction is noted. No renal or ureteral calculi are noted. Stomach/Bowel: The appendix appears normal. There is no evidence of bowel obstruction. Vascular/Lymphatic: Atherosclerosis of abdominal aorta is noted without aneurysm formation. No significant adenopathy is noted. Reproductive: Normal prostate gland. Other: No abnormal fluid collection is noted. Musculoskeletal: Mild multilevel degenerative disc disease is noted in the lumbar spine. IMPRESSION: Moderate bilateral pleural effusions are noted, with left greater than right. Coronary artery calcifications are noted suggesting coronary artery disease. Solitary gallstone is noted. Aortic atherosclerosis. No acute abnormality is noted in the abdomen or  pelvis. Electronically Signed   By: Lupita RaiderJames  Green Jr, M.D.   On: 04/15/2016 21:26   Dg Chest 2 View  Result Date: 04/15/2016 CLINICAL DATA:  Subacute onset of generalized weakness. Initial encounter. EXAM: CHEST  2 VIEW COMPARISON:  None. FINDINGS: Moderate left-sided and small right-sided pleural effusions are noted. Vascular congestion is noted, with increased interstitial markings, concerning for pulmonary edema. No pneumothorax is seen. The heart is enlarged. No acute osseous abnormalities are identified. IMPRESSION: Moderate left-sided and small right-sided pleural effusions. Vascular congestion and cardiomegaly, with increased interstitial markings, concerning for pulmonary edema. Electronically Signed   By: Roanna RaiderJeffery  Chang M.D.   On: 04/15/2016 21:35    EKG: Independently reviewed.  Assessment/Plan Principal Problem:   New onset of congestive heart failure (HCC) Active Problems:   Hypertension   CAD (coronary artery disease)   Atrial fibrillation (HCC)    1. New onset CHF - (Acute CHF, no I do not know if it is systolic or diastolic yet) 1. Spoke with Dr. Tenny Crawoss: 1. Hold all BP meds due to borderline low BP 2. Lasix 80mg  IV x1 dose to see how he responds both BP wise and kidney function wise 3. 2d echo in AM 4. 2gm sodium diet with 1.5L fluid restriction 5. Hold xarelto because he may very well need catheterization she says (for that inferior lateral ischemia, especially if its causing reduced EF as I suspect it may be). 6. Stop actos (which is known to potentially cause CHF) 7. Cards will formally consult in AM 2. Repeat BMP in AM to monitor kidney function, creat of 1.8 today is up from 1.4 last December. 3. CHF pathway 2. A.Fib - holding actos, holding beta blocker (confirmed with Dr. Tenny Crawoss that she also wants to see what he does off of the beta blocker). 3. HTN - hold all BP meds as above, giving lasix   DVT prophylaxis: heparin [place] Code Status: Full Family Communication:  Family at bedside Consults called: Spoke with Dr. Tenny Crawoss over phone, cards will formally consult in AM Admission status: Admit to inpatient   Hillary BowGARDNER, Amparo Donalson M. DO [facility] Pager [phone] from 7PM-7AM  If 7AM-7PM, please contact the day physician for the patient [email or web address] Password [email or web address]  04/16/2016, 12:25 AM

## 2016-04-17 ENCOUNTER — Inpatient Hospital Stay (HOSPITAL_COMMUNITY): Payer: Medicare Other

## 2016-04-17 DIAGNOSIS — I5031 Acute diastolic (congestive) heart failure: Secondary | ICD-10-CM

## 2016-04-17 DIAGNOSIS — I509 Heart failure, unspecified: Secondary | ICD-10-CM

## 2016-04-17 DIAGNOSIS — D649 Anemia, unspecified: Secondary | ICD-10-CM

## 2016-04-17 DIAGNOSIS — I10 Essential (primary) hypertension: Secondary | ICD-10-CM

## 2016-04-17 LAB — COMPREHENSIVE METABOLIC PANEL
ALBUMIN: 2.8 g/dL — AB (ref 3.5–5.0)
ALK PHOS: 34 U/L — AB (ref 38–126)
ALT: 75 U/L — AB (ref 17–63)
AST: 133 U/L — AB (ref 15–41)
Anion gap: 9 (ref 5–15)
BILIRUBIN TOTAL: 1.3 mg/dL — AB (ref 0.3–1.2)
BUN: 52 mg/dL — AB (ref 6–20)
CALCIUM: 9.3 mg/dL (ref 8.9–10.3)
CO2: 26 mmol/L (ref 22–32)
CREATININE: 1.59 mg/dL — AB (ref 0.61–1.24)
Chloride: 102 mmol/L (ref 101–111)
GFR calc Af Amer: 45 mL/min — ABNORMAL LOW (ref >60)
GFR calc non Af Amer: 38 mL/min — ABNORMAL LOW (ref >60)
GLUCOSE: 107 mg/dL — AB (ref 65–99)
Potassium: 3.6 mmol/L (ref 3.5–5.1)
Sodium: 137 mmol/L (ref 135–145)
TOTAL PROTEIN: 5.6 g/dL — AB (ref 6.5–8.1)

## 2016-04-17 LAB — CBC
HEMATOCRIT: 24.1 % — AB (ref 39.0–52.0)
HEMOGLOBIN: 8.2 g/dL — AB (ref 13.0–17.0)
MCH: 28 pg (ref 26.0–34.0)
MCHC: 34 g/dL (ref 30.0–36.0)
MCV: 82.3 fL (ref 78.0–100.0)
Platelets: 121 10*3/uL — ABNORMAL LOW (ref 150–400)
RBC: 2.93 MIL/uL — AB (ref 4.22–5.81)
RDW: 19.7 % — ABNORMAL HIGH (ref 11.5–15.5)
WBC: 5.2 10*3/uL (ref 4.0–10.5)

## 2016-04-17 LAB — GLUCOSE, CAPILLARY
Glucose-Capillary: 94 mg/dL (ref 65–99)
Glucose-Capillary: 118 mg/dL — ABNORMAL HIGH (ref 65–99)
Glucose-Capillary: 125 mg/dL — ABNORMAL HIGH (ref 65–99)
Glucose-Capillary: 122 mg/dL — ABNORMAL HIGH (ref 65–99)

## 2016-04-17 LAB — RETICULOCYTES
RBC.: 3.1 MIL/uL — AB (ref 4.22–5.81)
Retic Count, Absolute: 68.2 10*3/uL (ref 19.0–186.0)
Retic Ct Pct: 2.2 % (ref 0.4–3.1)

## 2016-04-17 LAB — HEPARIN LEVEL (UNFRACTIONATED)
Heparin Unfractionated: 0.69 IU/mL (ref 0.30–0.70)
Heparin Unfractionated: 0.64 IU/mL (ref 0.30–0.70)

## 2016-04-17 LAB — IRON AND TIBC
Iron: 37 ug/dL — ABNORMAL LOW (ref 45–182)
Saturation Ratios: 7 % — ABNORMAL LOW (ref 17.9–39.5)
TIBC: 542 ug/dL — ABNORMAL HIGH (ref 250–450)
UIBC: 505 ug/dL

## 2016-04-17 LAB — APTT

## 2016-04-17 LAB — VITAMIN B12: Vitamin B-12: 583 pg/mL (ref 180–914)

## 2016-04-17 LAB — FOLATE: Folate: 5.1 ng/mL — ABNORMAL LOW (ref >5.9)

## 2016-04-17 LAB — ECHOCARDIOGRAM COMPLETE
Height: 70 in
Weight: 2636.8 oz

## 2016-04-17 LAB — FERRITIN: FERRITIN: 31 ng/mL (ref 24–336)

## 2016-04-17 LAB — T4, FREE: Free T4: 3.08 ng/dL — ABNORMAL HIGH (ref 0.61–1.12)

## 2016-04-17 MED ORDER — FLUTICASONE PROPIONATE 50 MCG/ACT NA SUSP
1.0000 | Freq: Every day | NASAL | Status: DC
  Administered 2016-04-17: 1 via NASAL
  Filled 2016-04-17: qty 16

## 2016-04-17 NOTE — Progress Notes (Signed)
PROGRESS NOTE    Bruce Mann  ZOX:[id number] DOB: [birth year] DOA: 04/15/2016 PCP: Gaspar Garbe, MD   Chief Complaint  Patient presents with  . Fatigue  . Altered Mental Status     Brief Narrative:  HPI on 04/16/2016 by Dr. Lyda Perone Bruce Mann is a 82 y.o. male with medical history significant of A.Fib, rate controlled and on chronic xarelto, CAD with moderate triple vessel disease on cath in 2009, DM, HTN.  Patient presents to the ED with c/o several week history of severe "fatigue" even with minimal exertion.  He states he has to stop and rest after walking even a short distance across a parking lot.  No CP, doesn't really say its SOB.  No cough, congestion, fever.  No prior h/o angina or CHF in past.  Symptoms worse with exertion and better at rest. Assessment & Plan   New Onset CHF -No echocardiogram in EPIC -Patient endorses fatigue with minimal exertion, LE edema. -CXR: moderate left, small right pleural effusion. Vascular congestion, Cardiomegaly, pulm edema. -BNP: 1159.8 -Continue IV lasix -Monitor intake/output, daily weights -UOP 3295cc over past 24 hours -Cardiology consulted and appreciated -Echocardiogram pending -Patient may need cath  Atrial fibrillation -CHADSVASC at least 4 (given age, HTN, DM) -Xarelto held -Continue Heparin  Essential hypertension -home BP meds held -Continue IV lasix  Diabetes mellitus, type II -Actos held (in setting of possible CHF) -Place on ISS and CBG monitoring  Acute kidney injury vs Chronic kidney disease, stage III -No baseline creatinine, was 1.85 during admission -continue to monitor BMP -Creatinine currently 1.59  Normocytic Anemia -No baseline hemoglobin, Currently 8.2 -Will order anemia panel and FOBT (no reports of melena or hematochezia) -If hemoglobin continues to drop, will transfuse  -Continue to monitor CBC  Abnormal TSH -TSH 5.557, FT4 pending  Elevated LFTs -Currently on statin given  heart findings -Will continue to monitor LFTs (slight drop) -CT abd/pelvis: no acute abnormality of abd/pelvis  DVT Prophylaxis  Heparin  Code Status: Full  Family Communication: Noneat bedside  Disposition Plan: Admitted. Pending CHF workup and treatment.    Consultants Cardiology  Procedures  Echocardiogram  Antibiotics   Anti-infectives    None      Subjective:   Bruce Mann seen and examined today. Feels breathing is fine.  Does not feel as tired as he did, however, has not bee up walking. Denies chest pain, dizziness, headache, nausea, vomiting, abdominal pain.  Objective:   Vitals:   04/16/16 1946 04/17/16 0037 04/17/16 0639 04/17/16 0759  BP: (!) 102/40 (!) 111/40 (!) 121/54 (!) 116/49  Pulse: 67 (!) 58 74 65  Resp: 17 17 18 18   Temp: 98.5 F (36.9 C) 97.8 F (36.6 C) 97.7 F (36.5 C) 97.7 F (36.5 C)  TempSrc: Oral Oral Oral Oral  SpO2: 97% 97% 96% 98%  Weight:   74.8 kg (164 lb 12.8 oz)   Height:        Intake/Output Summary (Last 24 hours) at 04/17/16 1100 Last data filed at 04/17/16 0915  Gross per 24 hour  Intake           555.84 ml  Output             2695 ml  Net         -2139.16 ml   Filed Weights   04/16/16 0206 04/16/16 0527 04/17/16 0639  Weight: 79.2 kg (174 lb 11.2 oz) 78.9 kg (174 lb) 74.8 kg (164 lb 12.8 oz)  Exam  General: Well developed, well nourished, NAD  HEENT: NCAT,mucous membranes moist.   Cardiovascular: S1 S2 auscultated, irregular, 2/6SEM  Respiratory: Clear to auscultation bilaterally with equal chest rise  Abdomen: Soft, nontender, nondistended, + bowel sounds  Extremities: warm dry without cyanosis clubbing. +2 LE edema.  Neuro: AAOx3, nonfocal  Psych: Normal affect and demeanor with intact judgement and insight, pleasant   Data Reviewed: I have personally reviewed following labs and imaging studies  CBC:  Recent Labs Lab 04/15/16 1614 04/17/16 0420  WBC 5.7 5.2  HGB 9.2* 8.2*  HCT 27.2*  24.1*  MCV 83.7 82.3  PLT 153 121*   Basic Metabolic Panel:  Recent Labs Lab 04/15/16 1614 04/17/16 0420  NA 138 137  K 4.8 3.6  CL 107 102  CO2 25 26  GLUCOSE 120* 107*  BUN 53* 52*  CREATININE 1.85* 1.59*  CALCIUM 9.7 9.3   GFR: Estimated Creatinine Clearance: 36.3 mL/min (by C-G formula based on SCr of 1.59 mg/dL (H)). Liver Function Tests:  Recent Labs Lab 04/15/16 1614 04/17/16 0420  AST 157* 133*  ALT 85* 75*  ALKPHOS 43 34*  BILITOT 1.2 1.3*  PROT 6.6 5.6*  ALBUMIN 3.3* 2.8*    Recent Labs Lab 04/15/16 2140  LIPASE 56*   No results for input(s): AMMONIA in the last 168 hours. Coagulation Profile: No results for input(s): INR, PROTIME in the last 168 hours. Cardiac Enzymes: No results for input(s): CKTOTAL, CKMB, CKMBINDEX, TROPONINI in the last 168 hours. BNP (last 3 results) No results for input(s): PROBNP in the last 8760 hours. HbA1C: No results for input(s): HGBA1C in the last 72 hours. CBG:  Recent Labs Lab 04/16/16 0600 04/16/16 1205 04/16/16 1628 04/16/16 2026 04/17/16 0551  GLUCAP 97 116* 121* 156* 94   Lipid Profile: No results for input(s): CHOL, HDL, LDLCALC, TRIG, CHOLHDL, LDLDIRECT in the last 72 hours. Thyroid Function Tests:  Recent Labs  04/16/16 0118  TSH 5.557*   Anemia Panel: No results for input(s): VITAMINB12, FOLATE, FERRITIN, TIBC, IRON, RETICCTPCT in the last 72 hours. Urine analysis:    Component Value Date/Time   COLORURINE AMBER (A) 04/15/2016 1616   APPEARANCEUR CLEAR 04/15/2016 1616   LABSPEC 1.015 04/15/2016 1616   PHURINE 6.0 04/15/2016 1616   GLUCOSEU NEGATIVE 04/15/2016 1616   HGBUR NEGATIVE 04/15/2016 1616   BILIRUBINUR NEGATIVE 04/15/2016 1616   KETONESUR NEGATIVE 04/15/2016 1616   PROTEINUR NEGATIVE 04/15/2016 1616   NITRITE NEGATIVE 04/15/2016 1616   LEUKOCYTESUR NEGATIVE 04/15/2016 1616   Sepsis Labs: @LABRCNTIP (procalcitonin:4,lacticidven:4)  )No results found for this or any  previous visit (from the past 240 hour(s)).    Radiology Studies: Ct Abdomen Pelvis Wo Contrast  Result Date: 04/15/2016 CLINICAL DATA:  Generalized abdominal pain. EXAM: CT ABDOMEN AND PELVIS WITHOUT CONTRAST TECHNIQUE: Multidetector CT imaging of the abdomen and pelvis was performed following the standard protocol without IV contrast. COMPARISON:  None. FINDINGS: Lower chest: Moderate bilateral pleural effusions are noted with left greater than right. Coronary artery calcifications are noted. Hepatobiliary: Solitary gallstone is noted. No focal abnormality is seen in the liver on these unenhanced images. Pancreas: Normal. Spleen: Normal. Adrenals/Urinary Tract: Adrenal glands appear normal. Bilateral renal cysts are noted. This includes probable 1.5 cm exophytic hyperdense cyst arising from midpole of left kidney. No hydronephrosis or renal obstruction is noted. No renal or ureteral calculi are noted. Stomach/Bowel: The appendix appears normal. There is no evidence of bowel obstruction. Vascular/Lymphatic: Atherosclerosis of abdominal aorta is noted without aneurysm formation.  No significant adenopathy is noted. Reproductive: Normal prostate gland. Other: No abnormal fluid collection is noted. Musculoskeletal: Mild multilevel degenerative disc disease is noted in the lumbar spine. IMPRESSION: Moderate bilateral pleural effusions are noted, with left greater than right. Coronary artery calcifications are noted suggesting coronary artery disease. Solitary gallstone is noted. Aortic atherosclerosis. No acute abnormality is noted in the abdomen or pelvis. Electronically Signed   By: Lupita Raider, M.D.   On: 04/15/2016 21:26   Dg Chest 2 View  Result Date: 04/15/2016 CLINICAL DATA:  Subacute onset of generalized weakness. Initial encounter. EXAM: CHEST  2 VIEW COMPARISON:  None. FINDINGS: Moderate left-sided and small right-sided pleural effusions are noted. Vascular congestion is noted, with increased  interstitial markings, concerning for pulmonary edema. No pneumothorax is seen. The heart is enlarged. No acute osseous abnormalities are identified. IMPRESSION: Moderate left-sided and small right-sided pleural effusions. Vascular congestion and cardiomegaly, with increased interstitial markings, concerning for pulmonary edema. Electronically Signed   By: Roanna Raider M.D.   On: 04/15/2016 21:35     Scheduled Meds: . atorvastatin  80 mg Oral q1800  . furosemide  80 mg Intravenous BID  . insulin aspart  0-15 Units Subcutaneous TID WC  . sodium chloride flush  3 mL Intravenous Q12H   Continuous Infusions: . heparin 1,000 Units/hr (04/17/16 1051)     LOS: 1 day   Time Spent in minutes   30 minutes  Hedy Garro D.O. on 04/17/2016 at 11:00 AM  Between 7am to 7pm - Pager - [phone]  After 7pm go to [email or web address] - password [email or web address]  And look for the night coverage person covering for me after hours  [facility] Office  [phone]

## 2016-04-17 NOTE — Progress Notes (Signed)
Ted hose ordered for pt.  Waiting for order to come on the floor.  Amanda PeaNellie Cobie Marcoux, Charity fundraiserN.

## 2016-04-17 NOTE — Progress Notes (Signed)
ANTICOAGULATION CONSULT NOTE - Follow Up Consult  Pharmacy Consult for Heparin Indication: atrial fibrillation  No Known Allergies  Patient Measurements: Height: 5\' 10"  (177.8 cm) Weight: 164 lb 12.8 oz (74.8 kg) (scale a) IBW/kg (Calculated) : 73  Vital Signs: Temp: 97.9 F (36.6 C) (10/19 1935) Temp Source: Oral (10/19 1935) BP: 115/54 (10/19 1935) Pulse Rate: 72 (10/19 1935)  Labs:  Recent Labs  04/15/16 1614 04/16/16 0959 04/16/16 1859 04/17/16 0420 04/17/16 1833  HGB 9.2*  --   --  8.2*  --   HCT 27.2*  --   --  24.1*  --   PLT 153  --   --  121*  --   APTT  --  30 >200* >200*  --   HEPARINUNFRC  --  0.18*  --  0.69 0.64  CREATININE 1.85*  --   --  1.59*  --     Estimated Creatinine Clearance: 36.3 mL/min (by C-G formula based on SCr of 1.59 mg/dL (H)).   Medications:  Heparin @ 1000 units/hr  Assessment: 85yom on xarelto pta for afib, transitioned to heparin yesterday anticipating need for cath. Last xarelto dose 10/16. Baseline heparin level 0.18 indicating xarelto had pretty much cleared, baseline aPTT wnl.   Confirmatory heparin level = 0.64 therapeutic on 1000 units/hr  Goal of Therapy:  Heparin level 0.3-0.7 units/ml Monitor platelets by anticoagulation protocol: Yes   Plan:  1) Continue heparin at 1000 units/hr 2) f/u AM labs   Bayard HuggerMei Velvie Thomaston, PharmD, BCPS  Clinical Pharmacist  Pager: [phone]  04/17/2016,7:41 PM

## 2016-04-17 NOTE — Care Management Note (Signed)
Case Management Note  Patient Details  Name: Bruce Mann MRN: [id number] Date of Birth: [birth year]  Subjective/Objective:    Admitted with New Heart Failure              Action/Plan: Patient lives at home with spouse, he remains active in the community, continues to drives his car; PCP is Dr Wylene Simmerisovec; has private insurance with [employer] with prescription drug coverage; pharmacy of choice is [facility], pt/ spouse report no problem getting his medication. He follows a low sodium/ heart healthy diet; He also has scales at home and weighs himself daily. CM talked to pt/ spouse the importance of writing his weight down and when there is an increase in his weight to contact his physician. No needs identified at this time. CM will continue to follow for DCP.  Expected Discharge Date:   possibly 04/19/2016               Expected Discharge Plan:  Home/Self Care  In-House Referral:     Discharge planning Services  CM Consult     Status of Service:  In process, will continue to follow  Reola MosherChandler, Jadwiga Faidley L, RN,MHA,BSN [phone] 04/17/2016, 2:04 PM

## 2016-04-17 NOTE — Progress Notes (Signed)
ANTICOAGULATION CONSULT NOTE - Follow Up Consult  Pharmacy Consult for Heparin Indication: atrial fibrillation  No Known Allergies  Patient Measurements: Height: 5\' 10"  (177.8 cm) Weight: 164 lb 12.8 oz (74.8 kg) (scale a) IBW/kg (Calculated) : 73  Vital Signs: Temp: 97.6 F (36.4 C) (10/19 1240) Temp Source: Oral (10/19 1240) BP: 113/55 (10/19 1240) Pulse Rate: 58 (10/19 1240)  Labs:  Recent Labs  04/15/16 1614 04/16/16 0959 04/16/16 1859 04/17/16 0420  HGB 9.2*  --   --  8.2*  HCT 27.2*  --   --  24.1*  PLT 153  --   --  121*  APTT  --  30 >200* >200*  HEPARINUNFRC  --  0.18*  --  0.69  CREATININE 1.85*  --   --  1.59*    Estimated Creatinine Clearance: 36.3 mL/min (by C-G formula based on SCr of 1.59 mg/dL (H)).   Medications:  Heparin @ 1000 units/hr  Assessment: 85yom on xarelto pta for afib, transitioned to heparin yesterday anticipating need for cath. Last xarelto dose 10/16. Baseline heparin level 0.18 indicating xarelto had pretty much cleared, baseline aPTT wnl.   Last night, initial aPTT > 200 on 1200 units/hr, drip held x 2 hours then restarted at 1000 units/hr. Follow up heparin level this morning therapeutic at 0.69 but aPTT still > 200 which doesn't make sense. Today however, IV infiltrated so heparin off for a few hours and restarted ~ 1100.   Will stop checking aPTTs and just use heparin levels only. Hgb/Hct/Plts trending down will watch closely.  Goal of Therapy:  APTT 66-102 seconds Heparin level 0.3-0.7 units/ml Monitor platelets by anticoagulation protocol: Yes   Plan:  1) Continue heparin at 1000 units/hr 2) Check 8 hour heparin level  Fredrik RiggerMarkle, Kasem Mozer Sue 04/17/2016,1:02 PM

## 2016-04-17 NOTE — Progress Notes (Signed)
Patient resting in bed quietly with family and friends at the bedside. No acute distress. Focus assessment complete. No issues or concerns at this time. Patient and family made aware of shift change between nurses.

## 2016-04-17 NOTE — Progress Notes (Addendum)
Advanced Heart Failure Rounding Note   Subjective:   Diuresing with IV lasix. Brisk diuresis noted. Weight down 10 pounds.    Feeling better   Echo reviewed personally. EF 55-60% Normal RV  + left pleural effusion   Objective:   Weight Range:  Vital Signs:   Temp:  [97.7 F (36.5 C)-98.5 F (36.9 C)] 97.7 F (36.5 C) (10/19 0759) Pulse Rate:  [58-74] 65 (10/19 0759) Resp:  [16-18] 18 (10/19 0759) BP: (102-121)/(40-54) 116/49 (10/19 0759) SpO2:  [93 %-98 %] 98 % (10/19 0759) Weight:  [164 lb 12.8 oz (74.8 kg)] 164 lb 12.8 oz (74.8 kg) (10/19 0639) Last BM Date: 04/16/16  Weight change: Filed Weights   04/16/16 0206 04/16/16 0527 04/17/16 0639  Weight: 174 lb 11.2 oz (79.2 kg) 174 lb (78.9 kg) 164 lb 12.8 oz (74.8 kg)    Intake/Output:   Intake/Output Summary (Last 24 hours) at 04/17/16 0957 Last data filed at 04/17/16 0915  Gross per 24 hour  Intake           555.84 ml  Output             2995 ml  Net         -2439.16 ml    Physical Exam: General:  Elderly.  No resp difficulty HEENT: normal Neck: supple. JVP  7-8 . Carotids 2+ bilat; no bruits. No lymphadenopathy or thryomegaly appreciated. Cor: PMI nondisplaced. Irregular rate & rhythm. 2/6 MR Lungs: clear decreased at left base Abdomen: soft, nontender, nondistended. No hepatosplenomegaly. No bruits or masses. Good bowel sounds. Extremities: no cyanosis, clubbing, rash, R and LLE 1-2+ edema Neuro: alert & orientedx3, cranial nerves grossly intact. moves all 4 extremities w/o difficulty. Affect pleasant  Telemetry:  A fib 70s   Labs: Basic Metabolic Panel:  Recent Labs Lab 04/15/16 1614 04/17/16 0420  NA 138 137  K 4.8 3.6  CL 107 102  CO2 25 26  GLUCOSE 120* 107*  BUN 53* 52*  CREATININE 1.85* 1.59*  CALCIUM 9.7 9.3    Liver Function Tests:  Recent Labs Lab 04/15/16 1614 04/17/16 0420  AST 157* 133*  ALT 85* 75*  ALKPHOS 43 34*  BILITOT 1.2 1.3*  PROT 6.6 5.6*  ALBUMIN 3.3*  2.8*    Recent Labs Lab 04/15/16 2140  LIPASE 56*   No results for input(s): AMMONIA in the last 168 hours.  CBC:  Recent Labs Lab 04/15/16 1614 04/17/16 0420  WBC 5.7 5.2  HGB 9.2* 8.2*  HCT 27.2* 24.1*  MCV 83.7 82.3  PLT 153 121*    Cardiac Enzymes: No results for input(s): CKTOTAL, CKMB, CKMBINDEX, TROPONINI in the last 168 hours.  BNP: BNP (last 3 results)  Recent Labs  04/15/16 2140  BNP 1,159.8*    ProBNP (last 3 results) No results for input(s): PROBNP in the last 8760 hours.    Other results:  Imaging: Ct Abdomen Pelvis Wo Contrast  Result Date: 04/15/2016 CLINICAL DATA:  Generalized abdominal pain. EXAM: CT ABDOMEN AND PELVIS WITHOUT CONTRAST TECHNIQUE: Multidetector CT imaging of the abdomen and pelvis was performed following the standard protocol without IV contrast. COMPARISON:  None. FINDINGS: Lower chest: Moderate bilateral pleural effusions are noted with left greater than right. Coronary artery calcifications are noted. Hepatobiliary: Solitary gallstone is noted. No focal abnormality is seen in the liver on these unenhanced images. Pancreas: Normal. Spleen: Normal. Adrenals/Urinary Tract: Adrenal glands appear normal. Bilateral renal cysts are noted. This includes probable 1.5 cm exophytic hyperdense  cyst arising from midpole of left kidney. No hydronephrosis or renal obstruction is noted. No renal or ureteral calculi are noted. Stomach/Bowel: The appendix appears normal. There is no evidence of bowel obstruction. Vascular/Lymphatic: Atherosclerosis of abdominal aorta is noted without aneurysm formation. No significant adenopathy is noted. Reproductive: Normal prostate gland. Other: No abnormal fluid collection is noted. Musculoskeletal: Mild multilevel degenerative disc disease is noted in the lumbar spine. IMPRESSION: Moderate bilateral pleural effusions are noted, with left greater than right. Coronary artery calcifications are noted suggesting  coronary artery disease. Solitary gallstone is noted. Aortic atherosclerosis. No acute abnormality is noted in the abdomen or pelvis. Electronically Signed   By: Lupita RaiderJames  Green Jr, M.D.   On: 04/15/2016 21:26   Dg Chest 2 View  Result Date: 04/15/2016 CLINICAL DATA:  Subacute onset of generalized weakness. Initial encounter. EXAM: CHEST  2 VIEW COMPARISON:  None. FINDINGS: Moderate left-sided and small right-sided pleural effusions are noted. Vascular congestion is noted, with increased interstitial markings, concerning for pulmonary edema. No pneumothorax is seen. The heart is enlarged. No acute osseous abnormalities are identified. IMPRESSION: Moderate left-sided and small right-sided pleural effusions. Vascular congestion and cardiomegaly, with increased interstitial markings, concerning for pulmonary edema. Electronically Signed   By: Roanna RaiderJeffery  Chang M.D.   On: 04/15/2016 21:35      Medications:     Scheduled Medications: . atorvastatin  80 mg Oral q1800  . furosemide  80 mg Intravenous BID  . insulin aspart  0-15 Units Subcutaneous TID WC  . sodium chloride flush  3 mL Intravenous Q12H     Infusions: . heparin Stopped (04/17/16 0707)     PRN Medications:  sodium chloride, acetaminophen, ondansetron (ZOFRAN) IV, sodium chloride flush   Assessment:  1. Acute HF, diastolic 2. A/C CKD, stage IV 3. CAD- LHC 2009 with moderate 3v CAD 4.  A fib, permanent     --rate controlled on Xarelto     --This patients CHA2DS2-VASc Score is 6 5. Anemia, iron deficient - hgb 8.2  6. DM2   Plan/Discussion:   Volume status improving. Continue IV lasix. Creatinine trending down 1.8>1.59  ECHO 04/17/2016: EF 55-60%.     Length of Stay: 1   Amy Clegg NP-C  04/17/2016, 9:57 AM  Advanced Heart Failure Team Pager [phone] (M-F; 7a - 4p)  Please contact [facility] for night-coverage after hours (4p -7a ) and weekends on [email or web address]  Patient seen and examined with Tonye BecketAmy Clegg, NP. We  discussed all aspects of the encounter. I agree with the assessment and plan as stated above.   He has diuresed well. Echo reviewed and he has normal LV and RV function with left pleural effusion. Renal function improving. Will continue diuresis one more day. Have cardiac rehab see. Possibly home tomorrow.   My main question now if this was all a HF flare or is there another process going on that is also driving his functional decline. Given iron-deficient anemia, likely worth a GI eval as an outpatient. I d/w with his primary cardiologist Dr. SwazilandJordan.   Tomoko Sandra,MD 4:57 PM

## 2016-04-17 NOTE — Progress Notes (Signed)
Pt IV became disconnected from IV tubing and pt was bleeding, Heparin stopped and PIV removed, Sharlot GowdaGregg the pharmacist is aware. While talking with Sharlot GowdaGregg, the lab called with a critical PTT > 200, Sharlot GowdaGregg given the information and he stated the heparin level was within normal and we could continue with infusing heparin peripherally.

## 2016-04-17 NOTE — Progress Notes (Signed)
  Echocardiogram 2D Echocardiogram has been performed.  Jeptha Hinnenkamp 04/17/2016, 10:15 AM

## 2016-04-18 ENCOUNTER — Telehealth: Payer: Self-pay | Admitting: Cardiology

## 2016-04-18 ENCOUNTER — Encounter: Payer: Self-pay | Admitting: Gastroenterology

## 2016-04-18 DIAGNOSIS — I482 Chronic atrial fibrillation: Secondary | ICD-10-CM

## 2016-04-18 LAB — BASIC METABOLIC PANEL
Anion gap: 10 (ref 5–15)
BUN: 50 mg/dL — ABNORMAL HIGH (ref 6–20)
CO2: 27 mmol/L (ref 22–32)
Calcium: 9.1 mg/dL (ref 8.9–10.3)
Chloride: 100 mmol/L — ABNORMAL LOW (ref 101–111)
Creatinine, Ser: 1.52 mg/dL — ABNORMAL HIGH (ref 0.61–1.24)
GFR calc Af Amer: 47 mL/min — ABNORMAL LOW (ref >60)
GFR calc non Af Amer: 41 mL/min — ABNORMAL LOW (ref >60)
Glucose, Bld: 101 mg/dL — ABNORMAL HIGH (ref 65–99)
Potassium: 3 mmol/L — ABNORMAL LOW (ref 3.5–5.1)
Sodium: 137 mmol/L (ref 135–145)

## 2016-04-18 LAB — GLUCOSE, CAPILLARY
GLUCOSE-CAPILLARY: 116 mg/dL — AB (ref 65–99)
Glucose-Capillary: 103 mg/dL — ABNORMAL HIGH (ref 65–99)

## 2016-04-18 LAB — CBC
HCT: 25.1 % — ABNORMAL LOW (ref 39.0–52.0)
HEMOGLOBIN: 8.6 g/dL — AB (ref 13.0–17.0)
MCH: 28.2 pg (ref 26.0–34.0)
MCHC: 34.3 g/dL (ref 30.0–36.0)
MCV: 82.3 fL (ref 78.0–100.0)
Platelets: 107 10*3/uL — ABNORMAL LOW (ref 150–400)
RBC: 3.05 MIL/uL — ABNORMAL LOW (ref 4.22–5.81)
RDW: 19.7 % — ABNORMAL HIGH (ref 11.5–15.5)
WBC: 5.1 10*3/uL (ref 4.0–10.5)

## 2016-04-18 LAB — HEPARIN LEVEL (UNFRACTIONATED): Heparin Unfractionated: 0.86 IU/mL — ABNORMAL HIGH (ref 0.30–0.70)

## 2016-04-18 MED ORDER — LOSARTAN POTASSIUM 25 MG PO TABS: 12.5000 mg | ORAL_TABLET | Freq: Every day | ORAL | 6 refills | Status: DC

## 2016-04-18 MED ORDER — FERROUS SULFATE 325 (65 FE) MG PO TABS: 325.0000 mg | ORAL_TABLET | Freq: Two times a day (BID) | ORAL | 0 refills | Status: DC

## 2016-04-18 MED ORDER — RIVAROXABAN 15 MG PO TABS
15.0000 mg | ORAL_TABLET | Freq: Once | ORAL | Status: AC
  Administered 2016-04-18: 15 mg via ORAL
  Filled 2016-04-18: qty 1

## 2016-04-18 MED ORDER — CARVEDILOL 3.125 MG PO TABS: 3.1250 mg | ORAL_TABLET | Freq: Two times a day (BID) | ORAL | 6 refills | Status: DC

## 2016-04-18 MED ORDER — LOSARTAN POTASSIUM 25 MG PO TABS: 12.5000 mg | ORAL_TABLET | Freq: Every day | ORAL | Status: DC

## 2016-04-18 MED ORDER — FUROSEMIDE 40 MG PO TABS: 40.0000 mg | ORAL_TABLET | Freq: Every day | ORAL | Status: DC

## 2016-04-18 MED ORDER — FUROSEMIDE 40 MG PO TABS: 40.0000 mg | ORAL_TABLET | Freq: Every day | ORAL | 6 refills | Status: DC

## 2016-04-18 MED ORDER — POTASSIUM CHLORIDE CRYS ER 20 MEQ PO TBCR: 20.0000 meq | EXTENDED_RELEASE_TABLET | Freq: Every day | ORAL | 6 refills | Status: DC

## 2016-04-18 MED ORDER — CARVEDILOL 3.125 MG PO TABS: 3.1250 mg | ORAL_TABLET | Freq: Two times a day (BID) | ORAL | Status: DC

## 2016-04-18 MED ORDER — POTASSIUM CHLORIDE CRYS ER 20 MEQ PO TBCR
40.0000 meq | EXTENDED_RELEASE_TABLET | Freq: Once | ORAL | Status: AC
  Administered 2016-04-18: 40 meq via ORAL
  Filled 2016-04-18: qty 2

## 2016-04-18 MED ORDER — POTASSIUM CHLORIDE CRYS ER 20 MEQ PO TBCR
20.0000 meq | EXTENDED_RELEASE_TABLET | Freq: Every day | ORAL | Status: DC
  Filled 2016-04-18: qty 1

## 2016-04-18 MED ORDER — POTASSIUM CHLORIDE CRYS ER 20 MEQ PO TBCR
60.0000 meq | EXTENDED_RELEASE_TABLET | Freq: Once | ORAL | Status: AC
  Administered 2016-04-18: 60 meq via ORAL
  Filled 2016-04-18: qty 3

## 2016-04-18 NOTE — Discharge Instructions (Addendum)
Heart Failure  Heart failure means your heart has trouble pumping blood. This makes it hard for your body to work well. Heart failure is usually a long-term (chronic) condition. You must take good care of yourself and follow your doctor's treatment plan.  HOME CARE   Take your heart medicine as told by your doctor.    Do not stop taking medicine unless your doctor tells you to.    Do not skip any dose of medicine.    Refill your medicines before they run out.    Take other medicines only as told by your doctor or pharmacist.   Stay active if told by your doctor. The elderly and people with severe heart failure should talk with a doctor about physical activity.   Eat heart-healthy foods. Choose foods that are without trans fat and are low in saturated fat, cholesterol, and salt (sodium). This includes fresh or frozen fruits and vegetables, fish, lean meats, fat-free or low-fat dairy foods, whole grains, and high-fiber foods. Lentils and dried peas and beans (legumes) are also good choices.   Limit salt if told by your doctor.   Cook in a healthy way. Roast, grill, broil, bake, poach, steam, or stir-fry foods.   Limit fluids as told by your doctor.   Weigh yourself every morning. Do this after you pee (urinate) and before you eat breakfast. Write down your weight to give to your doctor.   Take your blood pressure and write it down if your doctor tells you to.   Ask your doctor how to check your pulse. Check your pulse as told.   Lose weight if told by your doctor.   Stop smoking or chewing tobacco. Do not use gum or patches that help you quit without your doctor's approval.   Schedule and go to doctor visits as told.   Nonpregnant women should have no more than 1 drink a day. Men should have no more than 2 drinks a day. Talk to your doctor about drinking alcohol.   Stop illegal drug use.   Stay current with shots (immunizations).   Manage your health conditions as told by your doctor.   Learn to  manage your stress.   Rest when you are tired.   If it is really hot outside:    Avoid intense activities.    Use air conditioning or fans, or get in a cooler place.    Avoid caffeine and alcohol.    Wear loose-fitting, lightweight, and light-colored clothing.   If it is really cold outside:    Avoid intense activities.    Layer your clothing.    Wear mittens or gloves, a hat, and a scarf when going outside.    Avoid alcohol.   Learn about heart failure and get support as needed.   Get help to maintain or improve your quality of life and your ability to care for yourself as needed.  GET HELP IF:    You gain weight quickly.   You are more short of breath than usual.   You cannot do your normal activities.   You tire easily.   You cough more than normal, especially with activity.   You have any or more puffiness (swelling) in areas such as your hands, feet, ankles, or belly (abdomen).   You cannot sleep because it is hard to breathe.   You feel like your heart is beating fast (palpitations).   You get dizzy or light-headed when you stand up.  GET HELP   are not doing well or get worse.   This information is not intended to replace advice given to you by your health care provider. Make sure you discuss any questions you have with your health care provider.   Document Released: 03/25/2008 Document Revised: 07/07/2014 Document Reviewed: 08/02/2012 Elsevier Interactive Patient Education 2016 [employer] Information on my medicine - XARELTO (Rivaroxaban)  This medication education was reviewed with me or my healthcare representative as part of my discharge preparation.  Why was Xarelto  prescribed for you? Xarelto was prescribed for you to reduce the risk of a blood clot forming that can cause a stroke if you have a medical condition called atrial fibrillation (a type of irregular heartbeat).  What do you need to know about xarelto ? Take your Xarelto ONCE DAILY at the same time every day with your evening meal. If you have difficulty swallowing the tablet whole, you may crush it and mix in applesauce just prior to taking your dose.  Take Xarelto exactly as prescribed by your doctor and DO NOT stop taking Xarelto without talking to the doctor who prescribed the medication.  Stopping without other stroke prevention medication to take the place of Xarelto may increase your risk of developing a clot that causes a stroke.  Refill your prescription before you run out.  After discharge, you should have regular check-up appointments with your healthcare provider that is prescribing your Xarelto.  In the future your dose may need to be changed if your kidney function or weight changes by a significant amount.  What do you do if you miss a dose? If you are taking Xarelto ONCE DAILY and you miss a dose, take it as soon as you remember on the same day then continue your regularly scheduled once daily regimen the next day. Do not take two doses of Xarelto at the same time or on the same day.   Important Safety Information A possible side effect of Xarelto is bleeding. You should call your healthcare provider right away if you experience any of the following: ? Bleeding from an injury or your nose that does not stop. ? Unusual colored urine (red or dark brown) or unusual colored stools (red or black). ? Unusual bruising for unknown reasons. ? A serious fall or if you hit your head (even if there is no bleeding).  Some medicines may interact with Xarelto and might increase your risk of bleeding while on Xarelto. To help avoid this, consult your healthcare provider or pharmacist  prior to using any new prescription or non-prescription medications, including herbals, vitamins, non-steroidal anti-inflammatory drugs (NSAIDs) and supplements.  This website has more information on Xarelto: [email or web address].

## 2016-04-18 NOTE — Discharge Summary (Addendum)
Physician Discharge Summary  Bruce Mann Bruce Mann DOB: 10/05/1930 DOA: 04/15/2016  PCP: Bruce Garbeichard W Tisovec, MD  Admit date: 04/15/2016 Discharge date: 04/18/2016  Time spent: 45 minutes  Recommendations for Outpatient Follow-up:  Patient will be discharged to home.  Patient will need to follow up with primary care provider within one week of discharge, repeat CBC and CMP. Repeat thyroid testing.  Follow up with Gastroenterology. Follow up with cardiology on 04/25/2016 at 10:30am.   Patient should continue medications as prescribed.  Patient should follow a Heart healthy/carb modified diet with 1500ml fluid restriction per day.   Discharge Diagnoses:  New Onset CHF- Acute diastolic heart failure Atrial fibrillation Essential hypertension Diabetes mellitus, type II Acute kidney injury vs Chronic kidney disease, stage III Normocytic Anemia Abnormal TSH/Hypothyroidism Elevated LFTs  Discharge Condition: Stable  Diet recommendation: Heart healthy/carb modified diet with 1500ml fluid restriction per day  Filed Weights   04/16/16 0527 04/17/16 0639 04/18/16 0521  Weight: 78.9 kg (174 lb) 74.8 kg (164 lb 12.8 oz) 72.2 kg (159 lb 3.2 oz)    History of present illness:  on 04/16/2016 by Dr. Tor NettersJared Mann Cian L Teagueis a 82 y.o.malewith medical history significant of A.Fib, rate controlled and on chronic xarelto, CAD with moderate triple vessel disease on cath in 2009, DM, HTN. Patient presents to the ED with c/o several week history of severe "fatigue" even with minimal exertion. He states he has to stop and rest after walking even a short distance across a parking lot. No CP, doesn't really say its SOB. No cough, congestion, fever. No prior h/o angina or CHF in past. Symptoms worse with exertion and better at rest.  Hospital Course:  New Onset CHF- Acute diastolic heart failure -No echocardiogram in EPIC -Patient endorses fatigue with minimal exertion, LE  edema. -CXR: moderate left, small right pleural effusion. Vascular congestion, Cardiomegaly, pulm edema. -BNP: 1159.8 -Initially placed on IV lasix, responded well. -Monitor intake/output, daily weights -UOP 3000cc over past 24 hours, weight down 21lbs since admission -Cardiology consulted and appreciated  -Echocardiogram EF 55-60%, severely dilated RA, mildly dilated LA, PA pressure 44 -Spoke with cardiology, given that patient's EF was normal, no need for cath at this point.  Continue diuresis and outpatient follow up -Will discharge patient with Coreg 3.125mg  BID, Losartan 12.5mg  QHS, Lasix 40mg  daily, Potassium  Atrial fibrillation -CHADSVASC at least 5 (given age, HTN, DM, CHF ) -Xarelto held, restarted today -Initially placed on heparin  Essential hypertension -home BP meds held -Continue coreg, lasix, losartan  Diabetes mellitus, type II -Actos held (in setting of possible CHF)- will continue to hold -Was placed on ISS and CBG monitoring during hospitalization  Acute kidney injury vs Chronic kidney disease, stage III -No baseline creatinine, was 1.85 during admission -Creatinine currently 1.52 -repeat CMP in one week  Normocytic Anemia -No baseline hemoglobin, Currently 8.6 -FOBT negative, no reports of melena or hematochezia -Anemia may be contributing to patient's fatigue -Iron 37, will replace orally -patient will need outpatient GI follow up  -Continue to monitor CBC -Patient has a colonoscopy, but some years ago.   -Scheduled appt for patient 10/31 at 9am.  Abnormal thyroid function tests -TSH 5.557, FT4 3.08 -Spoke with Dr. Everardo AllEllison, endocrinology.  Recommended repeating thyroid testing in one week.  Can follow up with patient if needed.  Elevated LFTs -Currently on statin given heart findings -Will continue to monitor LFTs (slight drop) -CT abd/pelvis: no acute abnormality of abd/pelvis -[facility]   Consultants Cardiology  Procedures   Echocardiogram  Discharge Exam: Vitals:   04/18/16 0521 04/18/16 1008  BP: (!) 118/50 (!) 120/41  Pulse: 62 84  Resp: 18   Temp: 98.2 F (36.8 C)    Exam  General: Well developed, well nourished, NAD  HEENT: NCAT,mucous membranes moist.   Cardiovascular: S1 S2 auscultated, irregular, 2/6SEM  Respiratory: Clear to auscultation bilaterally with equal chest rise  Abdomen: Soft, nontender, nondistended, + bowel sounds  Extremities: warm dry without cyanosis clubbing. + LE edema.  Neuro: AAOx3, nonfocal  Psych: Normal affect and demeanor with intact judgement and insight, pleasant  Discharge Instructions Discharge Instructions    Discharge instructions    Complete by:  As directed    Patient will be discharged to home.  Patient will need to follow up with primary care provider within one week of discharge, repeat CBC and CMP. Repeat thyroid testing.  Follow up with Gastroenterology. Follow up with cardiology on 04/25/2016 at 10:30am.   Patient should continue medications as prescribed.  Patient should follow a Heart healthy/carb modified diet with fluid restriction per day.     Current Discharge Medication List    START taking these medications   Details  carvedilol (COREG) 3.125 MG tablet Take 1 tablet (3.125 mg total) by mouth 2 (two) times daily with a meal. Qty: 60 tablet, Refills: 6    ferrous sulfate 325 (65 FE) MG tablet Take 1 tablet (325 mg total) by mouth 2 (two) times daily with a meal. Qty: 60 tablet, Refills: 0    furosemide (LASIX) 40 MG tablet Take 1 tablet (40 mg total) by mouth daily. Qty: 30 tablet, Refills: 6    potassium chloride SA (K-DUR,KLOR-CON) 20 MEQ tablet Take 1 tablet (20 mEq total) by mouth daily. Qty: 30 tablet, Refills: 6      CONTINUE these medications which have CHANGED   Details  losartan (COZAAR) 25 MG tablet Take 0.5 tablets (12.5 mg total) by mouth at bedtime. Qty: 18 tablet, Refills: 6      CONTINUE these  medications which have NOT CHANGED   Details  atorvastatin (LIPITOR) 80 MG tablet Take 80 mg by mouth daily.    Calcium Carbonate-Vitamin D (CALCIUM PLUS VITAMIN D PO) Take 1 tablet by mouth daily.     Choline Fenofibrate (TRILIPIX) 135 MG capsule Take 135 mg by mouth daily.    Multiple Vitamin (MULTIVITAMIN) tablet Take 1 tablet by mouth daily.    Omega-3 Fatty Acids (FISH OIL) 1000 MG CAPS Take 1,000 mg by mouth daily.    Rivaroxaban (XARELTO) 15 MG TABS tablet Take 15 mg by mouth daily with supper.    nitroGLYCERIN (NITROSTAT) 0.4 MG SL tablet Place 1 tablet (0.4 mg total) under the tongue every 5 (five) minutes as needed for chest pain. Qty: 25 tablet, Refills: 6   Associated Diagnoses: Coronary artery disease involving native coronary artery of native heart without angina pectoris      STOP taking these medications     amLODipine (NORVASC) 5 MG tablet      atenolol (TENORMIN) 25 MG tablet      hydrochlorothiazide (HYDRODIURIL) 25 MG tablet        No Known Allergies Follow-up Information    [facility] Follow up on 04/25/2016.   Why:  at 1015 for post hospital follow up.  Please bring all of your medications to your visit.  Contact information: [address] [place] [address] [phone]  Bruce Garbe, MD. Schedule an appointment as soon as possible for a visit in 1 week(s).   Specialty:  Internal Medicine Why:  Hospital follow up Left message with office Repeat CBC, CMP, thyroid testing Contact information: [address] [place] [address] [phone]        Charlie Pitter III, MD. Go on 04/29/2016.   Specialty:  Gastroenterology Why:  At 9am.   Contact information: [address] [place] [address] [phone]            The results of significant diagnostics from this hospitalization (including imaging, microbiology, ancillary and  laboratory) are listed below for reference.    Significant Diagnostic Studies: Ct Abdomen Pelvis Wo Contrast  Result Date: 04/15/2016 CLINICAL DATA:  Generalized abdominal pain. EXAM: CT ABDOMEN AND PELVIS WITHOUT CONTRAST TECHNIQUE: Multidetector CT imaging of the abdomen and pelvis was performed following the standard protocol without IV contrast. COMPARISON:  None. FINDINGS: Lower chest: Moderate bilateral pleural effusions are noted with left greater than right. Coronary artery calcifications are noted. Hepatobiliary: Solitary gallstone is noted. No focal abnormality is seen in the liver on these unenhanced images. Pancreas: Normal. Spleen: Normal. Adrenals/Urinary Tract: Adrenal glands appear normal. Bilateral renal cysts are noted. This includes probable 1.5 cm exophytic hyperdense cyst arising from midpole of left kidney. No hydronephrosis or renal obstruction is noted. No renal or ureteral calculi are noted. Stomach/Bowel: The appendix appears normal. There is no evidence of bowel obstruction. Vascular/Lymphatic: Atherosclerosis of abdominal aorta is noted without aneurysm formation. No significant adenopathy is noted. Reproductive: Normal prostate gland. Other: No abnormal fluid collection is noted. Musculoskeletal: Mild multilevel degenerative disc disease is noted in the lumbar spine. IMPRESSION: Moderate bilateral pleural effusions are noted, with left greater than right. Coronary artery calcifications are noted suggesting coronary artery disease. Solitary gallstone is noted. Aortic atherosclerosis. No acute abnormality is noted in the abdomen or pelvis. Electronically Signed   By: Lupita Raider, M.D.   On: 04/15/2016 21:26   Dg Chest 2 View  Result Date: 04/15/2016 CLINICAL DATA:  Subacute onset of generalized weakness. Initial encounter. EXAM: CHEST  2 VIEW COMPARISON:  None. FINDINGS: Moderate left-sided and small right-sided pleural effusions are noted. Vascular congestion is noted, with  increased interstitial markings, concerning for pulmonary edema. No pneumothorax is seen. The heart is enlarged. No acute osseous abnormalities are identified. IMPRESSION: Moderate left-sided and small right-sided pleural effusions. Vascular congestion and cardiomegaly, with increased interstitial markings, concerning for pulmonary edema. Electronically Signed   By: Roanna Raider M.D.   On: 04/15/2016 21:35    Microbiology: No results found for this or any previous visit (from the past 240 hour(s)).   Labs: Basic Metabolic Panel:  Recent Labs Lab 04/15/16 1614 04/17/16 0420 04/18/16 0422  NA 138 137 137  K 4.8 3.6 3.0*  CL 107 102 100*  CO2 25 26 27   GLUCOSE 120* 107* 101*  BUN 53* 52* 50*  CREATININE 1.85* 1.59* 1.52*  CALCIUM 9.7 9.3 9.1   Liver Function Tests:  Recent Labs Lab 04/15/16 1614 04/17/16 0420  AST 157* 133*  ALT 85* 75*  ALKPHOS 43 34*  BILITOT 1.2 1.3*  PROT 6.6 5.6*  ALBUMIN 3.3* 2.8*    Recent Labs Lab 04/15/16 2140  LIPASE 56*   No results for input(s): AMMONIA in the last 168 hours. CBC:  Recent Labs Lab 04/15/16 1614 04/17/16 0420 04/18/16 0422  WBC 5.7 5.2 5.1  HGB 9.2* 8.2* 8.6*  HCT 27.2* 24.1* 25.1*  MCV 83.7 82.3 82.3  PLT 153 121* 107*   Cardiac Enzymes: No results for input(s): CKTOTAL, CKMB, CKMBINDEX, TROPONINI in the last 168 hours. BNP: BNP (last 3 results)  Recent Labs  04/15/16 2140  BNP 1,159.8*    ProBNP (last 3 results) No results for input(s): PROBNP in the last 8760 hours.  CBG:  Recent Labs Lab 04/17/16 0551 04/17/16 1136 04/17/16 1650 04/17/16 2057 04/18/16 0621  GLUCAP 94 118* 125* 122* 103*       Signed:  Johannes Everage  [facility] 04/18/2016, 11:18 AM

## 2016-04-18 NOTE — Progress Notes (Signed)
Pt. Given discharge instructions and all questions answered.  Patient discharged via wheelchair with all belongings. 

## 2016-04-18 NOTE — Progress Notes (Signed)
ANTICOAGULATION CONSULT NOTE - Follow Up Consult  Pharmacy Consult for Heparin> rivaroxaban Indication: atrial fibrillation  No Known Allergies  Patient Measurements: Height: 5\' 10"  (177.8 cm) Weight: 409159 lb 3.2 oz (72.2 kg) (scale a) IBW/kg (Calculated) : 73  Vital Signs: Temp: 98.2 F (36.8 C) (10/20 0521) Temp Source: Oral (10/20 0521) BP: 118/50 (10/20 0521) Pulse Rate: 62 (10/20 0521)  Labs:  Recent Labs  04/15/16 1614  04/16/16 0959 04/16/16 1859 04/17/16 0420 04/17/16 1833 04/18/16 0422  HGB 9.2*  --   --   --  8.2*  --  8.6*  HCT 27.2*  --   --   --  24.1*  --  25.1*  PLT 153  --   --   --  121*  --  107*  APTT  --   --  30 >200* >200*  --   --   HEPARINUNFRC  --   < > 0.18*  --  0.69 0.64 0.86*  CREATININE 1.85*  --   --   --  1.59*  --  1.52*  < > = values in this interval not displayed.  Estimated Creatinine Clearance: 37.6 mL/min (by C-G formula based on SCr of 1.52 mg/dL (H)).   Medications:  Heparin @ 1000 units/hr  Assessment: 85yom on xarelto pta for afib, transitioned to heparin yesterday anticipating need for cath. Last xarelto dose 10/16.  Heparin drip while in hospital in case of procedure but now plan to dc home today.  Goal of Therapy:  Heparin level 0.3-0.7 units/ml Monitor platelets by anticoagulation protocol: Yes   Plan:  Stop heparin rivaroxaban 15mg  x1 prior to dc then daily there after  Leota SauersLisa Tiffanie Blassingame Pharm.D. CPP, BCPS Clinical Pharmacist [phone] 04/18/2016 9:08 AM

## 2016-04-18 NOTE — Telephone Encounter (Signed)
New message      Not sure if this is a TCM because a nurse from the hosp made the appt.  However, since it is 7-10 day follow up, I am sending it as a TCM.  Appt made on 04-25-16 with Rhonda Barrett.

## 2016-04-18 NOTE — Progress Notes (Signed)
CARDIAC REHAB PHASE I   Reviewed HF booklet and handouts with pt and spouse.  Discussed the importance of weighing daily, low sodium diet and exercising.  Discussed the importance of taking Lasix as prescribed and limiting fluid intake.  Discussed knowing different zones and when to call doctor or 911.  Pt and spouse verbalized understanding.   [phone]  Ples SpecterAshley L Tamiya Colello, MS 04/18/2016 11:42 AM

## 2016-04-18 NOTE — Evaluation (Signed)
Physical Therapy Evaluation and Discharge Patient Details Name: Bruce Mann MRN: [id number] DOB: Sep 29, 1930 Today's Date: 04/18/2016   History of Present Illness  82 y.o. male with medical history significant of permanent A.Fib, rate controlled and on chonic xarelto, CAD with moderate triple vessel disease on cath in 2009, DM, HTN Admitted with weakness. Found to have new onset CHF    Clinical Impression  Patient evaluated by Physical Therapy with no further PT needs identified. Patient with drop in SBP, with rise in DBP and overall rise in MAP with changes in position (MAP 59 supine, 66 standing, 69 stand after walk; See vitals flow sheet for other numbers). Patient with no dizziness, SaO2 >93% on room air with all activity, and ambulated 200 feet without difficulty. All education has been completed with patient and wife (re: use of home BP monitor in supine and standing with expected rise in BP) and they have no further questions.   See below for any follow-up Physical Therapy or equipment needs. PT is signing off. Thank you for this referral.     Follow Up Recommendations No PT follow up;Supervision for mobility/OOB    Equipment Recommendations  None recommended by PT    Recommendations for Other Services       Precautions / Restrictions Precautions Precautions: Fall Precaution Comments: incr risk due to orthostasis & asymptomatic      Mobility  Bed Mobility Overal bed mobility: Modified Independent                Transfers Overall transfer level: Needs assistance Equipment used: None Transfers: Sit to/from Stand Sit to Stand: Supervision         General transfer comment: supervision for safety as BP drops  Ambulation/Gait Ambulation/Gait assistance: Min guard;Supervision Ambulation Distance (Feet): 200 Feet Assistive device: None Gait Pattern/deviations: Step-through pattern;Decreased stride length;Drifts right/left   Gait velocity interpretation: Below  normal speed for age/gender General Gait Details: slight drift at times, independent recovery; denied dizziness  Stairs            Wheelchair Mobility    Modified Rankin (Stroke Patients Only)       Balance Overall balance assessment: Needs assistance Sitting-balance support: No upper extremity supported;Feet supported Sitting balance-Leahy Scale: Good     Standing balance support: No upper extremity supported;During functional activity Standing balance-Leahy Scale: Good Standing balance comment: stood with narrow base of support while using urinal with good balance             High level balance activites: Direction changes;Turns;Sudden stops;Head turns               Pertinent Vitals/Pain Pain Assessment: No/denies pain    Home Living Family/patient expects to be discharged to:: Private residence Living Arrangements: Spouse/significant other;Other relatives (grandson 26) Available Help at Discharge: Family;Available 24 hours/day Type of Home: House Home Access: Stairs to enter Entrance Stairs-Rails: Doctor, general practice of Steps: 4 Home Layout: Laundry or work area in basement;Able to live on main level with bedroom/bathroom Home Equipment: Gilmer Mor - single point      Prior Function Level of Independence: Independent         Comments: denies falls     Hand Dominance        Extremity/Trunk Assessment   Upper Extremity Assessment: Generalized weakness           Lower Extremity Assessment: Generalized weakness      Cervical / Trunk Assessment: Normal  Communication   Communication: No difficulties  Cognition  Arousal/Alertness: Awake/alert Behavior During Therapy: WFL for tasks assessed/performed Overall Cognitive Status: Within Functional Limits for tasks assessed (wife answered many ?'s (?some decr memory at baseline))                      General Comments General comments (skin integrity, edema, etc.): wife  present    Exercises     Assessment/Plan    PT Assessment Patent does not need any further PT services  PT Problem List            PT Treatment Interventions      PT Goals (Current goals can be found in the Care Plan section)  Acute Rehab PT Goals Patient Stated Goal: go home today PT Goal Formulation: All assessment and education complete, DC therapy    Frequency     Barriers to discharge        Co-evaluation               End of Session Equipment Utilized During Treatment: Gait belt Activity Tolerance: Patient tolerated treatment well Patient left: in chair;with call bell/phone within reach;with family/visitor present Nurse Communication: Mobility status;Other (comment) (BP response)         Time: [id number] PT Time Calculation (min) (ACUTE ONLY): 28 min   Charges:   PT Evaluation $PT Eval Low Complexity: 1 Procedure PT Treatments $Self Care/Home Management: 8-22   PT G Codes:        Bruce Mann 04/18/2016, 10:44 AM Pager [phone]

## 2016-04-18 NOTE — Progress Notes (Signed)
Advanced Heart Failure Rounding Note   Subjective:    States he is feeling better overall, but has not been OOB since arrival, so unsure about DOE. No SOB at rest in bed. No CP.   Echo reviewed by Dr. Gala Romney. EF 55-60% Normal RV  + left pleural effusion   Out 2.3 L and an additional 5 lbs on IV lasix. Creatinine 1.85 -> 1.59 -> 1.52. Weight down 15 pounds overall.   Objective:   Weight Range:  Vital Signs:   Temp:  [97.6 F (36.4 C)-98.2 F (36.8 C)] 98.2 F (36.8 C) (10/20 0521) Pulse Rate:  [58-72] 62 (10/20 0521) Resp:  [18] 18 (10/20 0521) BP: (113-119)/(41-55) 118/50 (10/20 0521) SpO2:  [93 %-100 %] 100 % (10/20 0521) Weight:  [159 lb 3.2 oz (72.2 kg)] 159 lb 3.2 oz (72.2 kg) (10/20 0521) Last BM Date: 04/16/16  Weight change: Filed Weights   04/16/16 0527 04/17/16 0639 04/18/16 0521  Weight: 174 lb (78.9 kg) 164 lb 12.8 oz (74.8 kg) 159 lb 3.2 oz (72.2 kg)    Intake/Output:   Intake/Output Summary (Last 24 hours) at 04/18/16 0748 Last data filed at 04/18/16 0651  Gross per 24 hour  Intake            678.9 ml  Output             2700 ml  Net          -2021.1 ml    Physical Exam: General:  Elderly.  No resp difficulty HEENT: normal Neck: supple. JVP 5-6 cm . Carotids 2+ bilat; no bruits. No thyromegaly or nodule noted Cor: PMI nondisplaced. Irregular rate & rhythm. 2/6 MR Lungs: CTAB, normal effort Abdomen: soft, NT, ND, no HSM. No bruits or masses. +BS  Extremities: no cyanosis, clubbing, rash. Trace ankle edema.  Neuro: alert & orientedx3, cranial nerves grossly intact. moves all 4 extremities w/o difficulty. Affect pleasant  Telemetry: Reviewed, A fib 70s   Labs: Basic Metabolic Panel:  Recent Labs Lab 04/15/16 1614 04/17/16 0420 04/18/16 0422  NA 138 137 137  K 4.8 3.6 3.0*  CL 107 102 100*  CO2 25 26 27   GLUCOSE 120* 107* 101*  BUN 53* 52* 50*  CREATININE 1.85* 1.59* 1.52*  CALCIUM 9.7 9.3 9.1    Liver Function  Tests:  Recent Labs Lab 04/15/16 1614 04/17/16 0420  AST 157* 133*  ALT 85* 75*  ALKPHOS 43 34*  BILITOT 1.2 1.3*  PROT 6.6 5.6*  ALBUMIN 3.3* 2.8*    Recent Labs Lab 04/15/16 2140  LIPASE 56*   No results for input(s): AMMONIA in the last 168 hours.  CBC:  Recent Labs Lab 04/15/16 1614 04/17/16 0420 04/18/16 0422  WBC 5.7 5.2 5.1  HGB 9.2* 8.2* 8.6*  HCT 27.2* 24.1* 25.1*  MCV 83.7 82.3 82.3  PLT 153 121* 107*    Cardiac Enzymes: No results for input(s): CKTOTAL, CKMB, CKMBINDEX, TROPONINI in the last 168 hours.  BNP: BNP (last 3 results)  Recent Labs  04/15/16 2140  BNP 1,159.8*    ProBNP (last 3 results) No results for input(s): PROBNP in the last 8760 hours.    Other results:  Imaging: No results found.   Medications:     Scheduled Medications: . atorvastatin  80 mg Oral q1800  . fluticasone  1 spray Each Nare Daily  . furosemide  80 mg Intravenous BID  . insulin aspart  0-15 Units Subcutaneous TID WC  . sodium chloride  flush  3 mL Intravenous Q12H    Infusions: . heparin 850 Units/hr (04/18/16 0624)    PRN Medications: sodium chloride, acetaminophen, ondansetron (ZOFRAN) IV, sodium chloride flush   Assessment:  1. Acute HF, diastolic 2. A/C CKD, stage IV 3. CAD- LHC 2009 with moderate 3v CAD 4.  A fib, permanent     --rate controlled on Xarelto     --This patients CHA2DS2-VASc Score is 6 5. Anemia, iron deficient - hgb 8.2  6. DM2   Plan/Discussion:    Volume status much improved.  Got dose of IV lasix this am.  Suspect can resume po diuretics tomorrow with much improved volume status.  Echo 04/17/16 EF 55-60% with left pleural effusion.    Creatinine stable to improved. K 3.0. Will supp prior to discharge.   Work-up this admission significant for IDA.  Recommend GI eval as outpatient.   Will have PT/cardiac rehab to see for anticipated discharge. Suspect he will need at least HHPT.   OK for home this afternoon.   Will schedule follow up with Dr. Elvis CoilJordan's office. He will see Theodore DemarkRhonda Barrett, PA-C at 1030 on Friday 04/25/16.  Can refer back to HF clinic as needed.  HF meds for discharge Coreg 3.125 mg BID Losartan 12.5 mg qhs Lasix 40 mg daily starting 04/19/16 Potassium 20 meq daily Xarelto 15 mg daily   Length of Stay: 2  Graciella FreerMichael Andrew Tillery PA-C  04/18/2016, 7:48 AM  Advanced Heart Failure Team Pager [phone] (M-F; 7a - 4p)  Please contact [facility] for night-coverage after hours (4p -7a ) and weekends on [email or web address]  Patient seen and examined with Otilio SaberAndy Tillery, PA-C. We discussed all aspects of the encounter. I agree with the assessment and plan as stated above.   Volume status much improved. EF normal on echo. Agree with meds as above. Would have cardiac rehab see before discharge today to make sure he can ambulate. Will have him f/u with Dr. SwazilandJordan. Can f/u in the HF Clinic as needed. Consider outpatient GI eval.   Bensimhon, Daniel,MD 10:47 AM

## 2016-04-18 NOTE — Progress Notes (Signed)
ANTICOAGULATION CONSULT NOTE - Follow Up Consult  Pharmacy Consult for Heparin Indication: atrial fibrillation  No Known Allergies  Patient Measurements: Height: 5\' 10"  (177.8 cm) Weight: 159 lb 3.2 oz (72.2 kg) (scale a) IBW/kg (Calculated) : 73  Vital Signs: Temp: 98.2 F (36.8 C) (10/20 0521) Temp Source: Oral (10/20 0521) BP: 118/50 (10/20 0521) Pulse Rate: 62 (10/20 0521)  Labs:  Recent Labs  04/15/16 1614  04/16/16 0959 04/16/16 1859 04/17/16 0420 04/17/16 1833 04/18/16 0422  HGB 9.2*  --   --   --  8.2*  --   --   HCT 27.2*  --   --   --  24.1*  --   --   PLT 153  --   --   --  121*  --   --   APTT  --   --  30 >200* >200*  --   --   HEPARINUNFRC  --   < > 0.18*  --  0.69 0.64 0.86*  CREATININE 1.85*  --   --   --  1.59*  --   --   < > = values in this interval not displayed.  Estimated Creatinine Clearance: 35.9 mL/min (by C-G formula based on SCr of 1.59 mg/dL (H)).   Medications:  Heparin @ 1000 units/hr  Assessment: Bruce Mann on xarelto pta for afib, transitioned to heparin anticipating need for cath. Last xarelto dose 10/16. Baseline heparin level 0.18 indicating xarelto had pretty much cleared. Heparin level now supratherapeutic at 0.86 on 1000 units/hr. No bleeding noted.  Goal of Therapy:  Heparin level 0.3-0.7 units/ml Monitor platelets by anticoagulation protocol: Yes   Plan:  Decrease heparin to 850 units/hr Will f/u 8hr heparin level  Bruce Mann, PharmD, BCPS Clinical pharmacist, pager [phone] 04/18/2016,5:37 AM

## 2016-04-21 NOTE — Telephone Encounter (Signed)
Called patient no answer.LMTC. 

## 2016-04-23 NOTE — Telephone Encounter (Signed)
Msg left for patient to call. 

## 2016-04-23 NOTE — Telephone Encounter (Signed)
TOC call back from wife.She stated pt is doing well.He is taking medications as prescribed.He understands discharge instructions.Advised to keep post hospital appointment with Rhonda Barrett PA 10/27/1Theodore Demark7 at [facility].

## 2016-04-25 ENCOUNTER — Ambulatory Visit
Admission: RE | Admit: 2016-04-25 | Discharge: 2016-04-25 | Disposition: A | Discharge status: Home / Self Care | Payer: Medicare Other | Source: Ambulatory Visit | Attending: Physician Assistant | Admitting: Physician Assistant

## 2016-04-25 ENCOUNTER — Ambulatory Visit (INDEPENDENT_AMBULATORY_CARE_PROVIDER_SITE_OTHER): Payer: Medicare Other | Admitting: Physician Assistant

## 2016-04-25 ENCOUNTER — Encounter: Payer: Self-pay | Admitting: Physician Assistant

## 2016-04-25 VITALS — BP 108/52 | HR 67 | Ht 70.0 in | Wt 166.0 lb

## 2016-04-25 DIAGNOSIS — J90 Pleural effusion, not elsewhere classified: Secondary | ICD-10-CM

## 2016-04-25 DIAGNOSIS — I482 Chronic atrial fibrillation: Secondary | ICD-10-CM

## 2016-04-25 DIAGNOSIS — I4821 Permanent atrial fibrillation: Secondary | ICD-10-CM

## 2016-04-25 DIAGNOSIS — I5032 Chronic diastolic (congestive) heart failure: Secondary | ICD-10-CM | POA: Diagnosis not present

## 2016-04-25 DIAGNOSIS — Z7901 Long term (current) use of anticoagulants: Secondary | ICD-10-CM | POA: Diagnosis not present

## 2016-04-25 DIAGNOSIS — I951 Orthostatic hypotension: Secondary | ICD-10-CM

## 2016-04-25 LAB — BASIC METABOLIC PANEL WITH GFR
BUN: 36 mg/dL — ABNORMAL HIGH (ref 7–25)
CALCIUM: 9.7 mg/dL (ref 8.6–10.3)
CO2: 26 mmol/L (ref 20–31)
Chloride: 98 mmol/L (ref 98–110)
Creat: 1.43 mg/dL — ABNORMAL HIGH (ref 0.70–1.11)
GFR, EST AFRICAN AMERICAN: 52 mL/min — AB (ref >=60)
GFR, EST NON AFRICAN AMERICAN: 45 mL/min — AB (ref >=60)
GLUCOSE: 119 mg/dL — AB (ref 65–99)
Potassium: 4.6 mmol/L (ref 3.5–5.3)
SODIUM: 133 mmol/L — AB (ref 135–146)

## 2016-04-25 NOTE — Progress Notes (Signed)
Cardiology Office Note   Date:  04/25/2016   ID:  Bruce Mann, DOB March 18, 1931, MRN [id number]  PCP:  Bruce Garbe, MD  Cardiologist:  Dr Swaziland  Bruce Mann, Bruce Loser, PA-C   History of Present Illness: Bruce Mann is a 82 y.o. male with a history of perm afib on Xarelto, mod CAD at cath 2009, DM, HTN  Admit 10/17-10/20 w/ D-CHF, new dx, abnl TSH, abnl LFTs, L pleural effusion, wt 159 lbs  Bruce Mann presents for post hospital follow-up. He is here today with his wife, who helps him manage his medications and heart failure issues.  Since discharge, his weight has been tracked carefully by his wife in addition to his blood pressure. She has gotten sitting and standing blood pressures on a daily basis before he has been given his medications. His weight has not varied more than 2 pounds, staying between 158 and 160 pounds.  When he first went home from the hospital, he was orthostatic with a systolic blood pressure that dropped from the 90s to the 60s systolic when he went from sitting to standing. He was lightheaded and dizzy with this. They did not contact the office and continued to give him his medications. His symptoms gradually improved. By today, his systolic blood pressure was in the 90s both sitting and standing. He did not describe orthostatic symptoms. He was given his medications. In the office, his systolic blood pressure is currently above the 100 and he feels fine.  He never has palpitations, has no awareness of his atrial fibrillation, and never feels his heart skip or race  He is still weak but is walking around the house and feels that he is getting stronger. He and his wife are compliant with He wonders if it is okay for him to get a massage. He wonders if it is okay for [facility] to [facility] or another [facility] and exercises. He and his wife also wonder if any of this is temporary or if all of these lifestyle changes are permanent.   Past Medical History:  Diagnosis  Date  . Anemia   . Atrial fibrillation (HCC)   . CAD (coronary artery disease)   . CHF (congestive heart failure) (HCC) 03/2016   NEW ONSET  . Diabetes mellitus type II   . Dyslipidemia   . Hyperlipidemia   . Hypertension   . Weakness 03/2016    Past Surgical History:  Procedure Laterality Date  . CARDIAC CATHETERIZATION     Ejection Fraction 60%  . CATARACT EXTRACTION, BILATERAL    . TONSILLECTOMY      Current Outpatient Prescriptions  Medication Sig Dispense Refill  . atorvastatin (LIPITOR) 80 MG tablet Take 80 mg by mouth daily.    . Calcium Carbonate-Vitamin D (CALCIUM PLUS VITAMIN D PO) Take 1 tablet by mouth daily.     . carvedilol (COREG) 3.125 MG tablet Take 1 tablet (3.125 mg total) by mouth 2 (two) times daily with a meal. 60 tablet 6  . Choline Fenofibrate (TRILIPIX) 135 MG capsule Take 135 mg by mouth daily.    [name] Calcium (STOOL SOFTENER PO) Take 1-3 Doses by mouth daily.    . ferrous sulfate 325 (65 FE) MG tablet Take 1 tablet (325 mg total) by mouth 2 (two) times daily with a meal. 60 tablet 0  . furosemide (LASIX) 40 MG tablet Take 1 tablet (40 mg total) by mouth daily. 30 tablet 6  . losartan (COZAAR)  25 MG tablet Take 0.5 tablets (12.5 mg total) by mouth at bedtime. 18 tablet 6  . Multiple Vitamin (MULTIVITAMIN) tablet Take 1 tablet by mouth daily.    . nitroGLYCERIN (NITROSTAT) 0.4 MG SL tablet Place 1 tablet (0.4 mg total) under the tongue every 5 (five) minutes as needed for chest pain. 25 tablet 6  . Omega-3 Fatty Acids (FISH OIL) 1000 MG CAPS Take 1,000 mg by mouth daily.    . pioglitazone (ACTOS) 15 MG tablet Take 1 tablet by mouth daily.    . potassium chloride SA (K-DUR,KLOR-CON) 20 MEQ tablet Take 1 tablet (20 mEq total) by mouth daily. 30 tablet 6  . ranitidine (ZANTAC) 150 MG tablet Take 150 mg by mouth 2 (two) times daily.    . Rivaroxaban (XARELTO) 15 MG TABS tablet Take 15 mg by mouth daily with supper.     No current facility-administered  medications for this visit.     Allergies:   Review of patient's allergies indicates no known allergies.    Social History:  The patient  reports that he has never smoked. He has never used smokeless tobacco. He reports that he does not drink alcohol or use drugs.   Family History:  The patient's family history includes Heart attack in his brother and father; Stroke in his mother.    ROS:  Please see the history of present illness. All other systems are reviewed and negative.    PHYSICAL EXAM: VS:  BP (!) 108/52   Pulse 67   Ht 5\' 10"  (1.778 m)   Wt 166 lb (75.3 kg)   BMI 23.82 kg/m  , BMI Body mass index is 23.82 kg/m. GEN: Well nourished, well developed, male in no acute distress  HEENT: normal for age  Neck: Minimal JVD, no carotid bruit, no masses Cardiac: Irregular rate and rhythm; soft murmur, no rubs, or gallops Respiratory:  Decreased breath sounds, left greater than right bases, normal work of breathing GI: soft, nontender, nondistended, + BS MS: no deformity or atrophy; trace-1+ edema; distal pulses are 2+ in all 4 extremities   Skin: warm and dry, no rash Neuro:  Strength and sensation are intact Psych: euthymic mood, full affect   EKG:  EKG is ordered today. The ekg ordered today demonstrates atrial fibrillation, controlled ventricular response  ECHO: 04/17/2016 - Left ventricle: The cavity size was normal. Wall thickness was   increased in a pattern of moderate LVH. Systolic function was   normal. The estimated ejection fraction was in the range of 55%   to 60%. Wall motion was normal; there were no regional wall   motion abnormalities. - Mitral valve: Moderately calcified annulus. - Left atrium: The atrium was mildly dilated. - Right ventricle: The cavity size was mildly dilated. - Right atrium: The atrium was severely dilated. - Pulmonary arteries: Systolic pressure was moderately increased.   PA peak pressure: 44 mm Hg (S). - Pericardium, extracardiac:  There was a left pleural effusion.  Recent Labs: 04/15/2016: B Natriuretic Peptide 1,159.8 04/16/2016: TSH 5.557 04/17/2016: ALT 75 04/18/2016: BUN 50; Creatinine, Ser 1.52; Hemoglobin 8.6; Platelets 107; Potassium 3.0; Sodium 137    Lipid Panel No results found for: CHOL, TRIG, HDL, CHOLHDL, VLDL, LDLCALC, LDLDIRECT   Wt Readings from Last 3 Encounters:  04/25/16 166 lb (75.3 kg)  04/18/16 159 lb 3.2 oz (72.2 kg)  10/08/15 184 lb 4 oz (83.6 kg)     Other studies Reviewed: Additional studies/ records that were reviewed today include: Office  notes, hospital records and testing.  ASSESSMENT AND PLAN:  1.  Chronic diastolic CHF: His weight is up on our scales, but on his home scales he is between 158 and 160 pounds. No dose change to his Lasix. Continue low sodium diet and and daily weights.  2. Orthostatic hypotension: He was initially orthostatic when he went home. Await chart and blood pressure readings are scanned in. When he first went home, his blood pressure will went from 102 down to 63 from sitting to standing. This has improved and he was not orthostatic today. His wife was given instructions that if his orthostatics are positive by more than 20 points, she is to hold his Lasix.     In addition, his blood pressure has been frequently less than 100. He is on a very low dose of carvedilol and losartan in addition to the Lasix. If his systolic blood pressure is below 90, before medications have been given, she is to hold his Coreg and losartan, and cut the Lasix in half (but give it if he is not orthostatic).  3. Permanent Atrial fibrillation: He is never aware of it and his heart rate is generally controlled. Continue low-dose carvedilol.  4. Chronic anticoagulation: His CHA2DS2VASc=6 (age x 2, HTN, CHF, DM, CAD). Continue Xarelto. He is having no bleeding issues.  5. Pleural effusion: This pleural effusion was moderate-large on his chest x-ray, taken upon admission. We will  recheck that today.   Current medicines are reviewed at length with the patient today.  The patient does not have concerns regarding medicines.  The following changes have been made:  Adjustments as described above when necessary  Labs/ tests ordered today include:   Orders Placed This Encounter  Procedures  . DG Chest 2 View  . BASIC METABOLIC PANEL WITH GFR     Disposition:   FU with Dr. Sw[place]and  Signed, Theodore Demark, PA-C  04/25/2016 1:00 PM    [facility] Phone: [phone]; Fax: [phone]  This note was written with the assistance of speech recognition software. Please excuse any transcriptional errors.

## 2016-04-25 NOTE — Patient Instructions (Signed)
Medication Instructions: HOLD Carvedilol, Lasix, and Losartan if your systolic BP drops more than 20 points with standing. If systolic BP drops in the 80s, hold carvedilol and losartan, but ok to take 1/2 dose of lasix.   Labwork: Your physician recommends that you return for lab work today: BMET   Testing/Procedures: A chest x-ray takes a picture of the organs and structures inside the chest, including the heart, lungs, and blood vessels. This test can show several things, including, whether the heart is enlarges; whether fluid is building up in the lungs; and whether pacemaker / defibrillator leads are still in place.   Follow-Up: Your physician recommends that you schedule a follow-up appointment in 3 months with Dr. SwazilandJordan.   Any Other Special Instructions will be listed below:  It is OK to exercise, but limit to 4 out of 10 exertion level.  It is OK to get a massage if you are not having to hold your BP medications.  Continue to take and record daily weights and blood pressures and a 2000 mg daily sodium diet.   If you need a refill on your cardiac medications before your next appointment, please call your pharmacy.

## 2016-04-28 ENCOUNTER — Ambulatory Visit (HOSPITAL_BASED_OUTPATIENT_CLINIC_OR_DEPARTMENT_OTHER): Payer: Medicare Other | Admitting: Hematology and Oncology

## 2016-04-28 ENCOUNTER — Encounter: Payer: Self-pay | Admitting: Hematology and Oncology

## 2016-04-28 DIAGNOSIS — D696 Thrombocytopenia, unspecified: Secondary | ICD-10-CM | POA: Diagnosis not present

## 2016-04-28 DIAGNOSIS — D649 Anemia, unspecified: Secondary | ICD-10-CM

## 2016-04-28 DIAGNOSIS — D508 Other iron deficiency anemias: Secondary | ICD-10-CM

## 2016-04-28 DIAGNOSIS — D619 Aplastic anemia, unspecified: Secondary | ICD-10-CM

## 2016-04-28 DIAGNOSIS — D509 Iron deficiency anemia, unspecified: Secondary | ICD-10-CM | POA: Insufficient documentation

## 2016-04-28 MED ORDER — FOLIC ACID 1 MG PO TABS: 1.0000 mg | ORAL_TABLET | Freq: Every day | ORAL | 3 refills | Status: DC

## 2016-04-28 NOTE — Progress Notes (Signed)
[facility] CONSULT NOTE  Patient Care Team: Gaspar Garbeichard W Tisovec, MD as PCP - General (Internal Medicine)  CHIEF COMPLAINTS/PURPOSE OF CONSULTATION:  Anemia and thrombocytopenia  HISTORY OF PRESENTING ILLNESS:  Bruce Mann 82 y.o. male is here because of recent diagnosis of anemia and thrombocytopenia. Patient had blood work in September with Dr. Wylene Simmerisovec and was found to have low hemoglobin of 9.6 along with low platelet count 138. He was recently hospitalized for fatigue and was found to be in congestive heart failure. During the hospitalization his hemoglobin had decreased to 8.6 and platelet count of 107. He felt markedly better after undergoing diuresis. He is here today accompanied by his wife to discuss his blood issues. He tells me that he has not noticed any blood in the stool. Stool Hemoccults done in the hospital when negative. He was put on oral iron therapy since he was not hospital. He does have severe constipation.  MEDICAL HISTORY:  Past Medical History:  Diagnosis Date  . Anemia   . Atrial fibrillation (HCC)   . CAD (coronary artery disease)   . CHF (congestive heart failure) (HCC) 03/2016   NEW ONSET  . Diabetes mellitus type II   . Dyslipidemia   . Hyperlipidemia   . Hypertension   . Weakness 03/2016    SURGICAL HISTORY: Past Surgical History:  Procedure Laterality Date  . CARDIAC CATHETERIZATION     Ejection Fraction 60%  . CATARACT EXTRACTION, BILATERAL    . TONSILLECTOMY      SOCIAL HISTORY: Social History   Social History  . Marital status: Married    Spouse name: N/A  . Number of children: N/A  . Years of education: N/A   Occupational History  . Not on file.   Social History Main Topics  . Smoking status: Never Smoker  . Smokeless tobacco: Never Used  . Alcohol use No  . Drug use: No  . Sexual activity: Not on file   Other Topics Concern  . Not on file   Social History Narrative  . No narrative on file    FAMILY  HISTORY: Family History  Problem Relation Age of Onset  . Stroke Mother   . Heart attack Father   . Heart attack Brother     ALLERGIES:  has No Known Allergies.  MEDICATIONS:  Current Outpatient Prescriptions  Medication Sig Dispense Refill  . atorvastatin (LIPITOR) 80 MG tablet Take 80 mg by mouth daily.    . Calcium Carbonate-Vitamin D (CALCIUM PLUS VITAMIN D PO) Take 1 tablet by mouth daily.     . carvedilol (COREG) 3.125 MG tablet Take 1 tablet (3.125 mg total) by mouth 2 (two) times daily with a meal. 60 tablet 6  . Choline Fenofibrate (TRILIPIX) 135 MG capsule Take 135 mg by mouth daily.    [name]. Docusate Calcium (STOOL SOFTENER PO) Take 1-3 Doses by mouth daily.    . ferrous sulfate 325 (65 FE) MG tablet Take 1 tablet (325 mg total) by mouth 2 (two) times daily with a meal. 60 tablet 0  . folic acid (FOLVITE) 1 MG tablet Take 1 tablet (1 mg total) by mouth daily. 90 tablet 3  . furosemide (LASIX) 40 MG tablet Take 1 tablet (40 mg total) by mouth daily. 30 tablet 6  . losartan (COZAAR) 25 MG tablet Take 0.5 tablets (12.5 mg total) by mouth at bedtime. 18 tablet 6  . Multiple Vitamin (MULTIVITAMIN) tablet Take 1 tablet by mouth daily.    .Marland Kitchen  nitroGLYCERIN (NITROSTAT) 0.4 MG SL tablet Place 1 tablet (0.4 mg total) under the tongue every 5 (five) minutes as needed for chest pain. 25 tablet 6  . Omega-3 Fatty Acids (FISH OIL) 1000 MG CAPS Take 1,000 mg by mouth daily.    . pioglitazone (ACTOS) 15 MG tablet Take 1 tablet by mouth daily.    . potassium chloride SA (K-DUR,KLOR-CON) 20 MEQ tablet Take 1 tablet (20 mEq total) by mouth daily. 30 tablet 6  . ranitidine (ZANTAC) 150 MG tablet Take 150 mg by mouth 2 (two) times daily.    . Rivaroxaban (XARELTO) 15 MG TABS tablet Take 15 mg by mouth daily with supper.     No current facility-administered medications for this visit.     REVIEW OF SYSTEMS:   Constitutional: Denies fevers, chills or abnormal night sweats, Fatigue Eyes: Denies  blurriness of vision, double vision or watery eyes Ears, nose, mouth, throat, and face: Denies mucositis or sore throat Respiratory: Denies cough, dyspnea or wheezes Cardiovascular: Denies palpitation, chest discomfort or lower extremity swelling Gastrointestinal:  Denies nausea, heartburn or change in bowel habits Skin: Denies abnormal skin rashes Lymphatics: Denies new lymphadenopathy or easy bruising Neurological:Denies numbness, tingling or new weaknesses Behavioral/Psych: Mood is stable, no new changes   All other systems were reviewed with the patient and are negative.  PHYSICAL EXAMINATION: ECOG PERFORMANCE STATUS: 1 - Symptomatic but completely ambulatory  Vitals:   04/28/16 1307 04/28/16 1357  BP: (!) 78/59 (!) 97/40  Pulse: 69   Resp: 18   Temp: 97.5 F (36.4 C)    Filed Weights   04/28/16 1307  Weight: 162 lb 3.2 oz (73.6 kg)    GENERAL:alert, no distress and comfortable SKIN: skin color, texture, turgor are normal, no rashes or significant lesions EYES: normal, conjunctiva are pink and non-injected, sclera clear OROPHARYNX:no exudate, no erythema and lips, buccal mucosa, and tongue normal  NECK: supple, thyroid normal size, non-tender, without nodularity LYMPH:  no palpable lymphadenopathy in the cervical, axillary or inguinal LUNGS: clear to auscultation and percussion with normal breathing effort HEART: regular rate & rhythm and no murmurs and no lower extremity edema ABDOMEN:abdomen soft, non-tender and normal bowel sounds Musculoskeletal:no cyanosis of digits and no clubbing  PSYCH: alert & oriented x 3 with fluent speech NEURO: no focal motor/sensory deficits  LABORATORY DATA:  I have reviewed the data as listed Lab Results  Component Value Date   WBC 5.1 04/18/2016   HGB 8.6 (L) 04/18/2016   HCT 25.1 (L) 04/18/2016   MCV 82.3 04/18/2016   PLT 107 (L) 04/18/2016   Lab Results  Component Value Date   NA 133 (L) 04/25/2016   K 4.6 04/25/2016   CL  98 04/25/2016   CO2 26 04/25/2016   ASSESSMENT AND PLAN:  Anemia Normocytic anemia and thrombocytopenia: Referred by Dr.Tisovec Hemoglobin 9.6 with MCV 98.4 and platelets 138 or 03/20/2016 (WBC count and differential are normal) Hospitalization 04/15/2016 to 04/18/2016 for severe fatigue (felt to be due to CHF and anemia): Hemoglobin 8.6, MCV 82, platelets 107, ferritin 31, TIBC 542 and 7% iron saturation, B-12 583, folic acid 5.1  04/18/2016  Differential: Combined folic acid and iron deficiency anemia In the hospital stool Hemoccults were negative. Patient is currently on blood thinners so potentially this may be related to intermittent occult bleeding. Otherwise malabsorption as a possible etiology as well.  Recommendation: 1. Folic acid supplementation 2. IV iron infusion  Return to clinic one month after iron infusion to reassess  her hemoglobin levels and symptoms.  Thrombocytopenia: I discussed the patient the differential diagnosis of thrombocytopenia between medications versus autoimmune causes versus bone marrow disorders versus nutritional causes. Since the degree of thrombocytopenia is not quite severe and the patient is elderly, we can watch and observe this for the time being. Folic acid replacement may improve his platelet count as well. I do not believe it is autoimmune related because of degree of thrombocytopenia is very mild.  All questions were answered. The patient knows to call the clinic with any problems, questions or concerns.    Sabas Sous, MD 04/28/16

## 2016-04-28 NOTE — Assessment & Plan Note (Addendum)
Normocytic anemia and thrombocytopenia: Referred by Dr.Tisovec Hemoglobin 9.6 with MCV 98.4 and platelets 138 or 03/20/2016 (WBC count and differential are normal) Hospitalization 04/15/2016 to 04/18/2016 for severe fatigue (felt to be due to CHF and anemia): Hemoglobin 8.6, MCV 82, platelets 107, ferritin 31, TIBC 542 and 7% iron saturation, B-12 583, folic acid 5.1  04/18/2016  Differential: Combined folic acid and iron deficiency anemia In the hospital stool Hemoccults were negative. Patient is currently on blood thinners so potentially this may be related to intermittent occult bleeding. Otherwise malabsorption as a possible etiology as well.  Recommendation: 1. Folic acid supplementation 2. IV iron infusion  Return to clinic one month after iron infusion to reassess her hemoglobin levels and symptoms.

## 2016-04-29 ENCOUNTER — Ambulatory Visit (INDEPENDENT_AMBULATORY_CARE_PROVIDER_SITE_OTHER): Payer: Medicare Other | Admitting: Gastroenterology

## 2016-04-29 ENCOUNTER — Encounter: Payer: Self-pay | Admitting: Gastroenterology

## 2016-04-29 VITALS — BP 120/60 | HR 82 | Ht 67.0 in | Wt 166.0 lb

## 2016-04-29 DIAGNOSIS — I482 Chronic atrial fibrillation: Secondary | ICD-10-CM

## 2016-04-29 DIAGNOSIS — I4821 Permanent atrial fibrillation: Secondary | ICD-10-CM

## 2016-04-29 DIAGNOSIS — D528 Other folate deficiency anemias: Secondary | ICD-10-CM | POA: Diagnosis not present

## 2016-04-29 DIAGNOSIS — I5032 Chronic diastolic (congestive) heart failure: Secondary | ICD-10-CM

## 2016-04-29 DIAGNOSIS — D508 Other iron deficiency anemias: Secondary | ICD-10-CM

## 2016-04-29 NOTE — Progress Notes (Signed)
[facility] Consult Note:  History: Bruce Mann 04/29/2016  Referring physician: Gaspar Garbe, MD,   Reason for consult/chief complaint: Anemia (Feeling stronger today, saw Dr Pamelia Hoit yest and to see PCP tomorrow)   Subjective  HPI:  This is an 82 year old man referred for anemia after recent hospitalization for decompensation of diastolic heart failure. I reviewed that discharge summary and the hematology note from yesterday. While hospitalized, the patient was diuresed and also found to have a large left-sided pleural effusion. He was anemic with hemoglobin down to 8.5 it was normocytic and his stools were heme negative. He had a ferritin of 31 and iron saturation of 7% with a folate that is low at 5.1. He is now on folate supplements and oral iron, and it sounds like he will have an IV iron treatment next week. He denies abdominal pain, altered bowel habits except for some brief constipation prior to the hospital stay, denies rectal bleeding, black tarry stool and has no family history of colon cancer. The patient had a colonoscopy with Dr. Marina Goodell in 2005 that only revealed diverticulosis. I reviewed the cardiology post hospital office note from 4 days ago, patient has chadsvasc score of 6, and is maintained on Xarelto for permanent A. Fib. Repeat chest x-ray that day showed persistent effusion, left greater than right His fatigue and dyspnea are only just starting to improve after the recent hospital stay. ROS:  Review of Systems  Constitutional: Negative for appetite change and unexpected weight change.       He lost a significant amount of weight with diuresis  HENT: Negative for mouth sores and voice change.   Eyes: Negative for pain and redness.  Respiratory: Positive for shortness of breath. Negative for cough.   Cardiovascular: Negative for chest pain and palpitations.  Genitourinary: Negative for dysuria and hematuria.  Musculoskeletal: Negative for  arthralgias and myalgias.  Skin: Negative for pallor and rash.  Neurological: Negative for weakness and headaches.  Hematological: Negative for adenopathy.     Past Medical History: Past Medical History:  Diagnosis Date  . Anemia   . Atrial fibrillation (HCC)   . CAD (coronary artery disease)   . CHF (congestive heart failure) (HCC) 03/2016   NEW ONSET  . Diabetes mellitus type II   . Dyslipidemia   . Hyperlipidemia   . Hypertension   . Weakness 03/2016     Past Surgical History: Past Surgical History:  Procedure Laterality Date  . CARDIAC CATHETERIZATION     Ejection Fraction 60%  . CATARACT EXTRACTION, BILATERAL    . COLONOSCOPY    . TONSILLECTOMY       Family History: Family History  Problem Relation Age of Onset  . Stroke Mother   . Heart attack Father   . Heart attack Brother   . Colon cancer Neg Hx     Social History: Social History   Social History  . Marital status: Married    Spouse name: [name]  . Number of children: 4  . Years of education: N/A   Occupational History  . real Engineer, drilling    Social History Main Topics  . Smoking status: Never Smoker  . Smokeless tobacco: Never Used  . Alcohol use No  . Drug use: No  . Sexual activity: Not Asked   Other Topics Concern  . None   Social History Narrative  . None    Allergies: No Known Allergies  Outpatient Meds: Current Outpatient Prescriptions  Medication Sig Dispense Refill  .  atorvastatin (LIPITOR) 80 MG tablet Take 80 mg by mouth daily.    . Calcium Carbonate-Vitamin D (CALCIUM PLUS VITAMIN D PO) Take 1 tablet by mouth daily.     . carvedilol (COREG) 3.125 MG tablet Take 1 tablet (3.125 mg total) by mouth 2 (two) times daily with a meal. 60 tablet 6  . Choline Fenofibrate (TRILIPIX) 135 MG capsule Take 135 mg by mouth daily.    [name]. Docusate Calcium (STOOL SOFTENER PO) Take 1-3 Doses by mouth daily.    . ferrous sulfate 325 (65 FE) MG tablet Take 1 tablet (325 mg total) by  mouth 2 (two) times daily with a meal. 60 tablet 0  . folic acid (FOLVITE) 1 MG tablet Take 1 tablet (1 mg total) by mouth daily. 90 tablet 3  . furosemide (LASIX) 40 MG tablet Take 1 tablet (40 mg total) by mouth daily. 30 tablet 6  . losartan (COZAAR) 25 MG tablet Take 0.5 tablets (12.5 mg total) by mouth at bedtime. 18 tablet 6  . Multiple Vitamin (MULTIVITAMIN) tablet Take 1 tablet by mouth daily.    . nitroGLYCERIN (NITROSTAT) 0.4 MG SL tablet Place 1 tablet (0.4 mg total) under the tongue every 5 (five) minutes as needed for chest pain. 25 tablet 6  . Omega-3 Fatty Acids (FISH OIL) 1000 MG CAPS Take 1,000 mg by mouth daily.    . pioglitazone (ACTOS) 15 MG tablet Take 1 tablet by mouth daily.    . potassium chloride SA (K-DUR,KLOR-CON) 20 MEQ tablet Take 1 tablet (20 mEq total) by mouth daily. 30 tablet 6  . ranitidine (ZANTAC) 150 MG tablet Take 150 mg by mouth 2 (two) times daily.    . Rivaroxaban (XARELTO) 15 MG TABS tablet Take 15 mg by mouth daily with supper.     No current facility-administered medications for this visit.       ___________________________________________________________________ Objective   Exam:  BP 120/60   Pulse 82   Ht 5\' 7"  (1.702 m)   Wt 166 lb (75.3 kg)   BMI 26.00 kg/m  His wife is with him today  General: this is a(n) elderly man, pleasant and conversational, no acute distress and breathing comfortably on room air  Eyes: sclera anicteric, no redness.  ENT: oral mucosa moist without lesions, no cervical or supraclavicular lymphadenopathy, good dentition  CV: Irregularly irregular without murmur, S1/S2, no JVD, no peripheral edema  Resp: Decreased breath sounds lower half of left lung field and right base, normal RR and effort noted. He has 2+ edema to the knee bilaterally  GI: soft, no tenderness, with active bowel sounds. No guarding or palpable organomegaly noted.  Skin; pale, no jaundice or ecchymoses  Neuro: awake, alert and oriented  x 3. Normal gross motor function and fluent speech Labs: CBC    Component Value Date/Time   WBC 5.1 04/18/2016 0422   RBC 3.05 (L) 04/18/2016 0422   HGB 8.6 (L) 04/18/2016 0422   HCT 25.1 (L) 04/18/2016 0422   PLT 107 (L) 04/18/2016 0422   MCV 82.3 04/18/2016 0422   MCH 28.2 04/18/2016 0422   MCHC 34.3 04/18/2016 0422   RDW 19.7 (H) 04/18/2016 0422   see iron studies and folate as noted above  Assessment: Encounter Diagnoses  Name Primary?  . Iron deficiency anemia secondary to inadequate dietary iron intake Yes  . Other folate deficiency anemias   . Chronic diastolic congestive heart failure (HCC)   . Permanent atrial fibrillation (HCC)     There  is currently no evidence that he is anemic on the basis of GI blood loss. I suspect he has low-grade malabsorption of iron and apparently folic acid, and seemingly a marrow production problem with thrombocytopenia.  Plan:  He does not appear to need an endoscopic workup for GI blood loss. I have advised him to continue hematology with supplements as advised and I will see him as needed arises.  Thank you for the courtesy of this consult.  Please call me with any questions or concerns.  Charlie PitterHenry L Danis III  CC: Gaspar Garbeichard W Tisovec, MD  Dr Serena CroissantVinay Gudena, Hematology

## 2016-04-29 NOTE — Patient Instructions (Signed)
If you are age 80 or older, your body mass index should be between 23-30. Your Body mass index is 26 kg/m. If this is out of the aforementioned range listed, please consider follow up with your Primary Care Provider.  If you are age 8964 or younger, your body mass index should be between 19-25. Your Body mass index is 26 kg/m. If this is out of the aformentioned range listed, please consider follow up with your Primary Care Provider.   Thank you for choosing [facility]  Dr Amada JupiterHenry Danis III

## 2016-05-07 ENCOUNTER — Ambulatory Visit (HOSPITAL_BASED_OUTPATIENT_CLINIC_OR_DEPARTMENT_OTHER): Payer: Medicare Other

## 2016-05-07 VITALS — BP 102/43 | HR 68 | Temp 98.2°F | Resp 16

## 2016-05-07 DIAGNOSIS — D508 Other iron deficiency anemias: Secondary | ICD-10-CM

## 2016-05-07 MED ORDER — SODIUM CHLORIDE 0.9 % IV SOLN
Freq: Once | INTRAVENOUS | Status: AC
  Administered 2016-05-07: 13:00:00 via INTRAVENOUS

## 2016-05-07 MED ORDER — FERUMOXYTOL INJECTION 510 MG/17 ML
510.0000 mg | Freq: Once | INTRAVENOUS | Status: AC
  Administered 2016-05-07: 510 mg via INTRAVENOUS
  Filled 2016-05-07: qty 17

## 2016-05-07 NOTE — Patient Instructions (Signed)

## 2016-05-12 NOTE — Addendum Note (Signed)
Addended by: Lorain ChildesPRESSLEY, Vegas Coffin A on: 05/12/2016 04:24 PM   Modules accepted: Orders

## 2016-05-14 ENCOUNTER — Ambulatory Visit (HOSPITAL_BASED_OUTPATIENT_CLINIC_OR_DEPARTMENT_OTHER): Payer: Medicare Other

## 2016-05-14 VITALS — BP 109/54 | HR 78 | Temp 98.2°F | Resp 16

## 2016-05-14 DIAGNOSIS — D508 Other iron deficiency anemias: Secondary | ICD-10-CM | POA: Diagnosis not present

## 2016-05-14 MED ORDER — SODIUM CHLORIDE 0.9 % IV SOLN
Freq: Once | INTRAVENOUS | Status: AC
  Administered 2016-05-14: 13:00:00 via INTRAVENOUS

## 2016-05-14 MED ORDER — SODIUM CHLORIDE 0.9 % IV SOLN
510.0000 mg | Freq: Once | INTRAVENOUS | Status: AC
  Administered 2016-05-14: 510 mg via INTRAVENOUS
  Filled 2016-05-14: qty 17

## 2016-05-14 NOTE — Patient Instructions (Signed)
Ferumoxytol injection What is this medicine? FERUMOXYTOL is an iron complex. Iron is used to make healthy red blood cells, which carry oxygen and nutrients throughout the body. This medicine is used to treat iron deficiency anemia in people with chronic kidney disease. COMMON BRAND NAME(S): Feraheme What should I tell my health care provider before I take this medicine? They need to know if you have any of these conditions: -anemia not caused by low iron levels -high levels of iron in the blood -magnetic resonance imaging (MRI) test scheduled -an unusual or allergic reaction to iron, other medicines, foods, dyes, or preservatives -pregnant or trying to get pregnant -breast-feeding How should I use this medicine? This medicine is for injection into a vein. It is given by a health care professional in a hospital or clinic setting. Talk to your pediatrician regarding the use of this medicine in children. Special care may be needed. What if I miss a dose? It is important not to miss your dose. Call your doctor or health care professional if you are unable to keep an appointment. What may interact with this medicine? This medicine may interact with the following medications: -other iron products What should I watch for while using this medicine? Visit your doctor or healthcare professional regularly. Tell your doctor or healthcare professional if your symptoms do not start to get better or if they get worse. You may need blood work done while you are taking this medicine. You may need to follow a special diet. Talk to your doctor. Foods that contain iron include: whole grains/cereals, dried fruits, beans, or peas, leafy green vegetables, and organ meats (liver, kidney). What side effects may I notice from receiving this medicine? Side effects that you should report to your doctor or health care professional as soon as possible: -allergic reactions like skin rash, itching or hives, swelling of the  face, lips, or tongue -breathing problems -changes in blood pressure -feeling faint or lightheaded, falls -fever or chills -flushing, sweating, or hot feelings -swelling of the ankles or feet Side effects that usually do not require medical attention (report to your doctor or health care professional if they continue or are bothersome): -diarrhea -headache -nausea, vomiting -stomach pain Where should I keep my medicine? This drug is given in a hospital or clinic and will not be stored at home.  2017 Elsevier/Gold Standard (2015-07-19 12:41:49)  

## 2016-05-17 NOTE — Progress Notes (Signed)
Bruce Mann Date of Birth: 12-Feb-1931 Medical Record #[id number]  History of Present Illness: Bruce Mann is seen for followup CAD, diastolic CHF,  and atrial fibrillation.  He has a  history of coronary disease. He had angioplasty of the first obtuse marginal vessel may of 2001. In 2009 he had an abnormal stress test which led to heart catheterization. This demonstrated moderate three-vessel obstructive coronary disease. LV function was normal at that time. He was asymptomatic. We recommended medical management. He also has a history of atrial fibrillation. This is managed with rate control and anticoagulation.  He was admitted in October 2017 with acute diastolic CHF and left pleural effusion. Echo showed normal EF, biatrial enlargement, mild pulmonary HTN. He was diuresed with improvement. Discharged on Coreg, ARB, and diuretics. Initially had orthostatic hypotension. Meds adjusted. BNP up to 1160. Repeat CXR on 10/27 showed resolution of pulmonary edema and improved but persistent effusions. He was also anemic with iron deficiency and started on iron therapy. Discharge weight was 159 lbs.  On follow up today he is doing better. Breathing back to baseline. Mentally sharper. Doing some walking daily. Is scheduled to see hematology with follow up lab work. [facility]  Current Outpatient Prescriptions on File Prior to Visit  Medication Sig Dispense Refill  . atorvastatin (LIPITOR) 80 MG tablet Take 80 mg by mouth daily.    . Calcium Carbonate-Vitamin D (CALCIUM PLUS VITAMIN D PO) Take 1 tablet by mouth daily.     . carvedilol (COREG) 3.125 MG tablet Take 1 tablet (3.125 mg total) by mouth 2 (two) times daily with a meal. 60 tablet 6  . Choline Fenofibrate (TRILIPIX) 135 MG capsule Take 135 mg by mouth daily.    [name]. Docusate Calcium (STOOL SOFTENER PO) Take 1-3 Doses by mouth daily.    . folic acid (FOLVITE) 1 MG tablet Take 1 tablet (1 mg total) by mouth daily. 90 tablet 3  .  furosemide (LASIX) 40 MG tablet Take 1 tablet (40 mg total) by mouth daily. 30 tablet 6  . losartan (COZAAR) 25 MG tablet Take 0.5 tablets (12.5 mg total) by mouth at bedtime. 18 tablet 6  . Multiple Vitamin (MULTIVITAMIN) tablet Take 1 tablet by mouth daily.    . nitroGLYCERIN (NITROSTAT) 0.4 MG SL tablet Place 1 tablet (0.4 mg total) under the tongue every 5 (five) minutes as needed for chest pain. 25 tablet 6  . Omega-3 Fatty Acids (FISH OIL) 1000 MG CAPS Take 1,000 mg by mouth daily.    . potassium chloride SA (K-DUR,KLOR-CON) 20 MEQ tablet Take 1 tablet (20 mEq total) by mouth daily. 30 tablet 6  . ranitidine (ZANTAC) 150 MG tablet Take 150 mg by mouth 2 (two) times daily.    . Rivaroxaban (XARELTO) 15 MG TABS tablet Take 15 mg by mouth daily with supper.    . ferrous sulfate 325 (65 FE) MG tablet Take 1 tablet (325 mg total) by mouth 2 (two) times daily with a meal. (Patient not taking: Reported on 05/19/2016) 60 tablet 0   No current facility-administered medications on file prior to visit.     No Known Allergies  Past Medical History:  Diagnosis Date  . Anemia   . Atrial fibrillation (HCC)   . CAD (coronary artery disease)   . CHF (congestive heart failure) (HCC) 03/2016   NEW ONSET  . Diabetes mellitus type II   . Dyslipidemia   . Hyperlipidemia   . Hypertension   .  Weakness 03/2016    Past Surgical History:  Procedure Laterality Date  . CARDIAC CATHETERIZATION     Ejection Fraction 60%  . CATARACT EXTRACTION, BILATERAL    . COLONOSCOPY    . TONSILLECTOMY      History  Smoking Status  . Never Smoker  Smokeless Tobacco  . Never Used    History  Alcohol Use No    Family History  Problem Relation Age of Onset  . Stroke Mother   . Heart attack Father   . Heart attack Brother   . Colon cancer Neg Hx     Review of Systems: As noted in history of present illness.  All other systems were reviewed and are negative.  Physical Exam: BP (!) 110/50 (BP  Location: Left Arm, Patient Position: Sitting, Cuff Size: Normal)   Pulse 80   Ht 5\' 8"  (1.727 m)   Wt 160 lb 3.2 oz (72.7 kg)   BMI 24.36 kg/m  He is a pleasant white male in no acute distress. HEENT: Normal. Neck is supple without JVD, adenopathy, thyromegaly, or bruits. Lungs: Clear Cardiovascular: Irregular rate and rhythm without gallop, murmur, or click. Normal S1 and S2. Abdomen: Soft and nontender. No hepatosplenomegaly or masses. Bowel sounds are positive. Extremities: No cyanosis, edema. Pedal pulses are 2+ and symmetric. Skin: Warm and dry. Neuro: Alert and oriented x3. Cranial nerves II through XII are intact. He has no focal motor or sensory deficits.  LABORATORY DATA: Lab Results  Component Value Date   WBC 5.1 04/18/2016   HGB 8.6 (L) 04/18/2016   HCT 25.1 (L) 04/18/2016   PLT 107 (L) 04/18/2016   GLUCOSE 119 (H) 04/25/2016   ALT 75 (H) 04/17/2016   AST 133 (H) 04/17/2016   NA 133 (L) 04/25/2016   K 4.6 04/25/2016   CL 98 04/25/2016   CREATININE 1.43 (H) 04/25/2016   BUN 36 (H) 04/25/2016   CO2 26 04/25/2016   TSH 5.557 (H) 04/16/2016    Labs dated 03/12/16: cholesterol 72, triglycerides 81, HDL 17, LDL 39. A1c 5.3%.  Echo: 04/17/16:Study Conclusions  - Left ventricle: The cavity size was normal. Wall thickness was   increased in a pattern of moderate LVH. Systolic function was   normal. The estimated ejection fraction was in the range of 55%   to 60%. Wall motion was normal; there were no regional wall   motion abnormalities. - Mitral valve: Moderately calcified annulus. - Left atrium: The atrium was mildly dilated. - Right ventricle: The cavity size was mildly dilated. - Right atrium: The atrium was severely dilated. - Pulmonary arteries: Systolic pressure was moderately increased.   PA peak pressure: 44 mm Hg (S). - Pericardium, extracardiac: There was a left pleural effusion   Assessment / Plan: 1. Atrial fibrillation-permanent. HR is well  controlled. We will continue Coreg at current dose. Continue Xarelto 15 mg daily adjusted for age and CKD.   2. Coronary disease. He had moderate three-vessel disease by cardiac catheterization in 2009. He is asymptomatic. We will continue medical management with beta blocker and ARB,  Continue statin therapy.  3. Chronic diastolic CHF. New onset in October with pleural effusions. Clinically improved. Weight is stable. Continue current diuretic therapy. Advance aerobic activity as tolerated. Sodium restriction.  4. Hypercholesterolemia continue high-dose statin and fish oil.   5. Diabetes mellitus type 2 on oral therapy.  6. HTN  7. Fe deficiency anemia. Per hematology.

## 2016-05-19 ENCOUNTER — Ambulatory Visit (INDEPENDENT_AMBULATORY_CARE_PROVIDER_SITE_OTHER): Payer: Medicare Other | Admitting: Cardiology

## 2016-05-19 ENCOUNTER — Encounter: Payer: Self-pay | Admitting: Cardiology

## 2016-05-19 VITALS — BP 110/50 | HR 80 | Ht 68.0 in | Wt 160.2 lb

## 2016-05-19 DIAGNOSIS — I2510 Atherosclerotic heart disease of native coronary artery without angina pectoris: Secondary | ICD-10-CM

## 2016-05-19 DIAGNOSIS — I482 Chronic atrial fibrillation: Secondary | ICD-10-CM

## 2016-05-19 DIAGNOSIS — I4821 Permanent atrial fibrillation: Secondary | ICD-10-CM

## 2016-05-19 DIAGNOSIS — I5032 Chronic diastolic (congestive) heart failure: Secondary | ICD-10-CM

## 2016-05-19 DIAGNOSIS — E7800 Pure hypercholesterolemia, unspecified: Secondary | ICD-10-CM | POA: Diagnosis not present

## 2016-05-19 NOTE — Patient Instructions (Signed)
Continue your current therapy  I will see you in 3 months.  Gradually increase your aerobic activity as tolerated

## 2016-06-09 ENCOUNTER — Encounter: Payer: Self-pay | Admitting: *Deleted

## 2016-06-09 ENCOUNTER — Telehealth: Payer: Self-pay

## 2016-06-09 ENCOUNTER — Other Ambulatory Visit: Payer: Self-pay

## 2016-06-09 ENCOUNTER — Other Ambulatory Visit (HOSPITAL_BASED_OUTPATIENT_CLINIC_OR_DEPARTMENT_OTHER): Payer: Medicare Other

## 2016-06-09 DIAGNOSIS — D508 Other iron deficiency anemias: Secondary | ICD-10-CM

## 2016-06-09 LAB — CBC WITH DIFFERENTIAL/PLATELET
BASO%: 0.6 % (ref 0.0–2.0)
BASOS ABS: 0 10*3/uL (ref 0.0–0.1)
EOS%: 4.5 % (ref 0.0–7.0)
Eosinophils Absolute: 0.2 10*3/uL (ref 0.0–0.5)
HCT: 35.8 % — ABNORMAL LOW (ref 38.4–49.9)
HGB: 11.8 g/dL — ABNORMAL LOW (ref 13.0–17.1)
LYMPH#: 0.9 10*3/uL (ref 0.9–3.3)
LYMPH%: 17.1 % (ref 14.0–49.0)
MCH: 31.5 pg (ref 27.2–33.4)
MCHC: 33 g/dL (ref 32.0–36.0)
MCV: 95.5 fL (ref 79.3–98.0)
MONO#: 0.5 10*3/uL (ref 0.1–0.9)
MONO%: 10.5 % (ref 0.0–14.0)
NEUT#: 3.5 10*3/uL (ref 1.5–6.5)
NEUT%: 67.3 % (ref 39.0–75.0)
Platelets: 125 10*3/uL — ABNORMAL LOW (ref 140–400)
RBC: 3.75 10*6/uL — AB (ref 4.20–5.82)
RDW: 20.7 % — AB (ref 11.0–14.6)
WBC: 5.1 10*3/uL (ref 4.0–10.3)
nRBC: 0 % (ref 0–0)

## 2016-06-09 NOTE — Assessment & Plan Note (Signed)
Normocytic anemia and thrombocytopenia: Referred by Dr.Tisovec Hemoglobin 9.6 with MCV 98.4 and platelets 138 or 03/20/2016 (WBC count and differential are normal) Hospitalization 04/15/2016 to 04/18/2016 for severe fatigue (felt to be due to CHF and anemia): Hemoglobin 8.6, MCV 82, platelets 107, ferritin 31, TIBC 542 and 7% iron saturation, B-12 583, folic acid 5.1  04/18/2016  Differential: Combined folic acid and iron deficiency anemia In the hospital stool Hemoccults were negative. Patient is currently on blood thinners so potentially this may be related to intermittent occult bleeding. Otherwise malabsorption as a possible etiology as well.  Treatment: 1. Folic acid supplementation 2. IV iron infusion 05/07/16 X 2 doses  Lab Review:   RTC in 3 months with labs

## 2016-06-09 NOTE — Telephone Encounter (Signed)
Called pt and lvm regarding labs for iron drawn in wrong tube. Left message for pt to arrive at 9am for lab work. It will take 45 minutes for iron results to come back. Pt has appt with Dr.Gudena at 1015am. Callback number provided for pt to confirm appt.

## 2016-06-10 ENCOUNTER — Ambulatory Visit (HOSPITAL_BASED_OUTPATIENT_CLINIC_OR_DEPARTMENT_OTHER): Payer: Medicare Other | Admitting: Hematology and Oncology

## 2016-06-10 ENCOUNTER — Encounter: Payer: Self-pay | Admitting: Hematology and Oncology

## 2016-06-10 ENCOUNTER — Other Ambulatory Visit (HOSPITAL_BASED_OUTPATIENT_CLINIC_OR_DEPARTMENT_OTHER): Payer: Medicare Other

## 2016-06-10 DIAGNOSIS — D696 Thrombocytopenia, unspecified: Secondary | ICD-10-CM | POA: Diagnosis not present

## 2016-06-10 DIAGNOSIS — D649 Anemia, unspecified: Secondary | ICD-10-CM | POA: Diagnosis not present

## 2016-06-10 DIAGNOSIS — D619 Aplastic anemia, unspecified: Secondary | ICD-10-CM

## 2016-06-10 DIAGNOSIS — D508 Other iron deficiency anemias: Secondary | ICD-10-CM

## 2016-06-10 LAB — IRON AND TIBC
%SAT: 33 % (ref 20–55)
Iron: 113 ug/dL (ref 42–163)
TIBC: 343 ug/dL (ref 202–409)
UIBC: 230 ug/dL (ref 117–376)

## 2016-06-10 LAB — FERRITIN: Ferritin: 168 ng/ml (ref 22–316)

## 2016-06-10 NOTE — Progress Notes (Signed)
Patient Care Team: Gaspar Garbeichard W Tisovec, MD as PCP - General (Internal Medicine)  DIAGNOSIS:  Encounter Diagnosis  Name Primary?  Marland Kitchen. Anemia due to bone marrow failure, unspecified bone marrow failure type (HCC)     SUMMARY OF ONCOLOGIC HISTORY:IV iron infusion, folic acid supplementation  CHIEF COMPLIANT: Follow-up after IV iron infusion therapy  INTERVAL HISTORY: Bruce Mann is a 82 year old with above-mentioned history of anemia related to iron deficiency as well as folic acid deficiency. He received IV iron treatment a month ago and is here today to review his blood work. He reports that the IV iron treatment went very well. He has had some improvement in his strength and energy levels.  REVIEW OF SYSTEMS:   Constitutional: Denies fevers, chills or abnormal weight loss Eyes: Denies blurriness of vision Ears, nose, mouth, throat, and face: Denies mucositis or sore throat Respiratory: Denies cough, dyspnea or wheezes Cardiovascular: Denies palpitation, chest discomfort Gastrointestinal:  Denies nausea, heartburn or change in bowel habits Skin: Denies abnormal skin rashes Lymphatics: Denies new lymphadenopathy or easy bruising Neurological:Denies numbness, tingling or new weaknesses Behavioral/Psych: Mood is stable, no new changes  Extremities: No lower extremity edema  All other systems were reviewed with the patient and are negative.  I have reviewed the past medical history, past surgical history, social history and family history with the patient and they are unchanged from previous note.  ALLERGIES:  has No Known Allergies.  MEDICATIONS:  Current Outpatient Prescriptions  Medication Sig Dispense Refill  . atorvastatin (LIPITOR) 80 MG tablet Take 80 mg by mouth daily.    . Calcium Carbonate-Vitamin D (CALCIUM PLUS VITAMIN D PO) Take 1 tablet by mouth daily.     . carvedilol (COREG) 3.125 MG tablet Take 1 tablet (3.125 mg total) by mouth 2 (two) times daily with a meal.  60 tablet 6  . Choline Fenofibrate (TRILIPIX) 135 MG capsule Take 135 mg by mouth daily.    [name]. Docusate Calcium (STOOL SOFTENER PO) Take 1-3 Doses by mouth daily.    . folic acid (FOLVITE) 1 MG tablet Take 1 tablet (1 mg total) by mouth daily. 90 tablet 3  . furosemide (LASIX) 40 MG tablet Take 1 tablet (40 mg total) by mouth daily. 30 tablet 6  . losartan (COZAAR) 25 MG tablet Take 0.5 tablets (12.5 mg total) by mouth at bedtime. 18 tablet 6  . Multiple Vitamin (MULTIVITAMIN) tablet Take 1 tablet by mouth daily.    . nitroGLYCERIN (NITROSTAT) 0.4 MG SL tablet Place 1 tablet (0.4 mg total) under the tongue every 5 (five) minutes as needed for chest pain. 25 tablet 6  . Omega-3 Fatty Acids (FISH OIL) 1000 MG CAPS Take 1,000 mg by mouth daily.    . potassium chloride SA (Mann-DUR,KLOR-CON) 20 MEQ tablet Take 1 tablet (20 mEq total) by mouth daily. 30 tablet 6  . ranitidine (ZANTAC) 150 MG tablet Take 150 mg by mouth 2 (two) times daily.    . Rivaroxaban (XARELTO) 15 MG TABS tablet Take 15 mg by mouth daily with supper.     No current facility-administered medications for this visit.     PHYSICAL EXAMINATION: ECOG PERFORMANCE STATUS: 1 - Symptomatic but completely ambulatory  Vitals:   06/10/16 0930  BP: 129/62  Pulse: 65  Resp: 18  Temp: 97.7 F (36.5 C)   Filed Weights   06/10/16 0930  Weight: 160 lb 8 oz (72.8 kg)    GENERAL:alert, no distress and comfortable SKIN: skin color, texture, turgor  are normal, no rashes or significant lesions EYES: normal, Conjunctiva are pink and non-injected, sclera clear OROPHARYNX:no exudate, no erythema and lips, buccal mucosa, and tongue normal  NECK: supple, thyroid normal size, non-tender, without nodularity LYMPH:  no palpable lymphadenopathy in the cervical, axillary or inguinal LUNGS: clear to auscultation and percussion with normal breathing effort HEART: regular rate & rhythm and no murmurs and no lower extremity edema ABDOMEN:abdomen soft,  non-tender and normal bowel sounds MUSCULOSKELETAL:no cyanosis of digits and no clubbing  NEURO: alert & oriented x 3 with fluent speech, no focal motor/sensory deficits EXTREMITIES: No lower extremity edema  LABORATORY DATA:  I have reviewed the data as listed   Chemistry      Component Value Date/Time   NA 133 (L) 04/25/2016 1143   Mann 4.6 04/25/2016 1143   CL 98 04/25/2016 1143   CO2 26 04/25/2016 1143   BUN 36 (H) 04/25/2016 1143   CREATININE 1.43 (H) 04/25/2016 1143      Component Value Date/Time   CALCIUM 9.7 04/25/2016 1143   ALKPHOS 34 (L) 04/17/2016 0420   AST 133 (H) 04/17/2016 0420   ALT 75 (H) 04/17/2016 0420   BILITOT 1.3 (H) 04/17/2016 0420       Lab Results  Component Value Date   WBC 5.1 06/09/2016   HGB 11.8 (L) 06/09/2016   HCT 35.8 (L) 06/09/2016   MCV 95.5 06/09/2016   PLT 125 (L) 06/09/2016   NEUTROABS 3.5 06/09/2016    ASSESSMENT & PLAN:  Anemia Normocytic anemia and thrombocytopenia: Referred by Dr.Tisovec Hemoglobin 9.6 with MCV 98.4 and platelets 138 or 03/20/2016 (WBC count and differential are normal) Hospitalization 04/15/2016 to 04/18/2016 for severe fatigue (felt to be due to CHF and anemia): Hemoglobin 8.6, MCV 82, platelets 107, ferritin 31, TIBC 542 and 7% iron saturation, B-12 583, folic acid 5.1  04/18/2016  Differential: Combined folic acid and iron deficiency anemia In the hospital stool Hemoccults were negative. Patient is currently on blood thinners so potentially this may be related to intermittent occult bleeding. Otherwise malabsorption as a possible etiology as well.  Treatment: 1. Folic acid supplementation 2. IV iron infusion 05/07/16 X 2 doses  Lab Review12/05/2016 : Hemoglobin improved from 8.6-11.8, platelets stable at 125, MCV improved from 82-95. Iron studies are pending.  RTC in 3 months with labs   Orders Placed This Encounter  Procedures  . CBC & Diff and Retic    Standing Status:   Future    Standing  Expiration Date:   06/10/2017  . Ferritin    Standing Status:   Future    Standing Expiration Date:   06/10/2017  . Iron and TIBC    Standing Status:   Future    Standing Expiration Date:   06/10/2017  . Folate, Serum    Standing Status:   Future    Standing Expiration Date:   06/10/2017   The patient has a good understanding of the overall plan. he agrees with it. he will call with any problems that may develop before the next visit here.   Bruce Mann, Bruce Ewy K, MD 06/10/16

## 2016-08-21 NOTE — Progress Notes (Signed)
Bernadene Bell Date of Birth: [birth year] Medical Record #[id number]  History of Present Illness: Bruce Mann is seen for followup CAD, diastolic CHF,  and atrial fibrillation.  He has a  history of coronary disease. He had angioplasty of the first obtuse marginal vessel may of 2001. In 2009 he had an abnormal stress test which led to heart catheterization. This demonstrated moderate three-vessel obstructive coronary disease. LV function was normal at that time. He was asymptomatic. We recommended medical management. He also has a history of atrial fibrillation. This is managed with rate control and anticoagulation.  He was admitted in October 2017 with acute diastolic CHF and left pleural effusion. Echo showed normal EF, biatrial enlargement, mild pulmonary HTN. He was diuresed with improvement. Discharged on Coreg, ARB, and diuretics. Initially had orthostatic hypotension. Meds adjusted. BNP up to 1160. Repeat CXR on 10/27 showed resolution of pulmonary edema and improved but persistent effusions. He was also anemic with iron deficiency and started on iron therapy. Discharge weight was 159 lbs.  On follow up today he is doing better. He is seen with his wife. Breathing is good. [facility]. Weight at home has been stable. BP and HR are controlled. No increase edema. He does have a persistent cough.   Current Outpatient Prescriptions on File Prior to Visit  Medication Sig Dispense Refill  . atorvastatin (LIPITOR) 80 MG tablet Take 80 mg by mouth daily.    . Calcium Carbonate-Vitamin D (CALCIUM PLUS VITAMIN D PO) Take 1 tablet by mouth daily.     . carvedilol (COREG) 3.125 MG tablet Take 1 tablet (3.125 mg total) by mouth 2 (two) times daily with a meal. 60 tablet 6  . Choline Fenofibrate (TRILIPIX) 135 MG capsule Take 135 mg by mouth daily.    [name] Calcium (STOOL SOFTENER PO) Take 1-3 Doses by mouth daily.    . folic acid (FOLVITE) 1 MG tablet Take 1  tablet (1 mg total) by mouth daily. 90 tablet 3  . furosemide (LASIX) 40 MG tablet Take 1 tablet (40 mg total) by mouth daily. 30 tablet 6  . losartan (COZAAR) 25 MG tablet Take 0.5 tablets (12.5 mg total) by mouth at bedtime. 18 tablet 6  . Multiple Vitamin (MULTIVITAMIN) tablet Take 1 tablet by mouth daily.    . nitroGLYCERIN (NITROSTAT) 0.4 MG SL tablet Place 1 tablet (0.4 mg total) under the tongue every 5 (five) minutes as needed for chest pain. 25 tablet 6  . Omega-3 Fatty Acids (FISH OIL) 1000 MG CAPS Take 1,000 mg by mouth daily.    . potassium chloride SA (K-DUR,KLOR-CON) 20 MEQ tablet Take 1 tablet (20 mEq total) by mouth daily. 30 tablet 6  . ranitidine (ZANTAC) 150 MG tablet Take 150 mg by mouth 2 (two) times daily.    . Rivaroxaban (XARELTO) 15 MG TABS tablet Take 15 mg by mouth daily with supper.     No current facility-administered medications on file prior to visit.     No Known Allergies  Past Medical History:  Diagnosis Date  . Anemia   . Atrial fibrillation (HCC)   . CAD (coronary artery disease)   . CHF (congestive heart failure) (HCC) 03/2016   NEW ONSET  . Diabetes mellitus type II   . Dyslipidemia   . Hyperlipidemia   . Hypertension   . Weakness 03/2016    Past Surgical History:  Procedure Laterality Date  . CARDIAC CATHETERIZATION  Ejection Fraction 60%  . CATARACT EXTRACTION, BILATERAL    . COLONOSCOPY    . TONSILLECTOMY      History  Smoking Status  . Never Smoker  Smokeless Tobacco  . Never Used    History  Alcohol Use No    Family History  Problem Relation Age of Onset  . Stroke Mother   . Heart attack Father   . Heart attack Brother   . Colon cancer Neg Hx     Review of Systems: As noted in history of present illness.  All other systems were reviewed and are negative.  Physical Exam: BP 126/80   Pulse 75   Ht 5\' 8"  (1.727 m)   Wt 170 lb (77.1 kg)   BMI 25.85 kg/m  He is a pleasant white male in no acute  distress. HEENT: Normal. Neck is supple without JVD, adenopathy, thyromegaly, or bruits. Lungs: Clear Cardiovascular: Irregular rate and rhythm without gallop, murmur, or click. Normal S1 and S2. Abdomen: Soft and nontender. No hepatosplenomegaly or masses. Bowel sounds are positive. Extremities: No cyanosis, edema. Pedal pulses are 2+ and symmetric. Skin: Warm and dry. Neuro: Alert and oriented x3. Cranial nerves II through XII are intact. He has no focal motor or sensory deficits.  LABORATORY DATA: Lab Results  Component Value Date   WBC 5.1 06/09/2016   HGB 11.8 (L) 06/09/2016   HCT 35.8 (L) 06/09/2016   PLT 125 (L) 06/09/2016   GLUCOSE 119 (H) 04/25/2016   ALT 75 (H) 04/17/2016   AST 133 (H) 04/17/2016   NA 133 (L) 04/25/2016   K 4.6 04/25/2016   CL 98 04/25/2016   CREATININE 1.43 (H) 04/25/2016   BUN 36 (H) 04/25/2016   CO2 26 04/25/2016   TSH 5.557 (H) 04/16/2016    Labs dated 03/12/16: cholesterol 72, triglycerides 81, HDL 17, LDL 39. A1c 5.3%.  Echo: 04/17/16:Study Conclusions  - Left ventricle: The cavity size was normal. Wall thickness was   increased in a pattern of moderate LVH. Systolic function was   normal. The estimated ejection fraction was in the range of 55%   to 60%. Wall motion was normal; there were no regional wall   motion abnormalities. - Mitral valve: Moderately calcified annulus. - Left atrium: The atrium was mildly dilated. - Right ventricle: The cavity size was mildly dilated. - Right atrium: The atrium was severely dilated. - Pulmonary arteries: Systolic pressure was moderately increased.   PA peak pressure: 44 mm Hg (S). - Pericardium, extracardiac: There was a left pleural effusion   Assessment / Plan: 1. Atrial fibrillation-permanent. HR is well controlled. We will continue Coreg at current dose. Continue Xarelto 15 mg daily adjusted for age and CKD.   2. Coronary disease. He had moderate three-vessel disease by cardiac catheterization  in 2009. He is asymptomatic. We will continue medical management with beta blocker and ARB,  Continue statin therapy.  3. Chronic diastolic CHF. New onset in October with pleural effusions. Clinically improved. Weight is stable at home but up on our scales. Continue current diuretic therapy.  Sodium restriction. Continue to monitor weight at home.  4. Hypercholesterolemia continue high-dose statin and fish oil.   5. Diabetes mellitus type 2 on oral therapy.  6. HTN  7. Fe deficiency anemia. Per hematology.

## 2016-08-22 ENCOUNTER — Encounter: Payer: Self-pay | Admitting: Cardiology

## 2016-08-22 ENCOUNTER — Ambulatory Visit (INDEPENDENT_AMBULATORY_CARE_PROVIDER_SITE_OTHER): Payer: Medicare Other | Admitting: Cardiology

## 2016-08-22 VITALS — BP 126/80 | HR 75 | Ht 68.0 in | Wt 170.0 lb

## 2016-08-22 DIAGNOSIS — I2510 Atherosclerotic heart disease of native coronary artery without angina pectoris: Secondary | ICD-10-CM | POA: Diagnosis not present

## 2016-08-22 DIAGNOSIS — I5032 Chronic diastolic (congestive) heart failure: Secondary | ICD-10-CM

## 2016-08-22 DIAGNOSIS — I482 Chronic atrial fibrillation: Secondary | ICD-10-CM | POA: Diagnosis not present

## 2016-08-22 DIAGNOSIS — I4821 Permanent atrial fibrillation: Secondary | ICD-10-CM

## 2016-08-22 NOTE — Patient Instructions (Signed)
Continue your current therapy  I will see you in 6 months.   

## 2016-08-29 ENCOUNTER — Telehealth: Payer: Self-pay | Admitting: Hematology and Oncology

## 2016-08-29 NOTE — Telephone Encounter (Signed)
Pt called to r/s appt to 3/13 at 230 pm

## 2016-09-08 ENCOUNTER — Other Ambulatory Visit: Payer: Medicare Other

## 2016-09-08 ENCOUNTER — Ambulatory Visit: Payer: Medicare Other | Admitting: Hematology and Oncology

## 2016-09-09 ENCOUNTER — Encounter: Payer: Self-pay | Admitting: Hematology and Oncology

## 2016-09-09 ENCOUNTER — Ambulatory Visit (HOSPITAL_BASED_OUTPATIENT_CLINIC_OR_DEPARTMENT_OTHER): Payer: Medicare Other | Admitting: Hematology and Oncology

## 2016-09-09 ENCOUNTER — Other Ambulatory Visit (HOSPITAL_BASED_OUTPATIENT_CLINIC_OR_DEPARTMENT_OTHER): Payer: Medicare Other

## 2016-09-09 DIAGNOSIS — D509 Iron deficiency anemia, unspecified: Secondary | ICD-10-CM

## 2016-09-09 DIAGNOSIS — D619 Aplastic anemia, unspecified: Secondary | ICD-10-CM

## 2016-09-09 LAB — FERRITIN: FERRITIN: 128 ng/mL (ref 22–316)

## 2016-09-09 LAB — CBC & DIFF AND RETIC
BASO%: 0.5 % (ref 0.0–2.0)
Basophils Absolute: 0 10*3/uL (ref 0.0–0.1)
EOS%: 3.4 % (ref 0.0–7.0)
Eosinophils Absolute: 0.2 10*3/uL (ref 0.0–0.5)
HCT: 38.9 % (ref 38.4–49.9)
HGB: 12.8 g/dL — ABNORMAL LOW (ref 13.0–17.1)
Immature Retic Fract: 0.4 % — ABNORMAL LOW (ref 3.00–10.60)
LYMPH#: 1.6 10*3/uL (ref 0.9–3.3)
LYMPH%: 25.6 % (ref 14.0–49.0)
MCH: 32.2 pg (ref 27.2–33.4)
MCHC: 32.9 g/dL (ref 32.0–36.0)
MCV: 97.7 fL (ref 79.3–98.0)
MONO#: 0.6 10*3/uL (ref 0.1–0.9)
MONO%: 9.7 % (ref 0.0–14.0)
NEUT%: 60.8 % (ref 39.0–75.0)
NEUTROS ABS: 3.7 10*3/uL (ref 1.5–6.5)
PLATELETS: 147 10*3/uL (ref 140–400)
RBC: 3.98 10*6/uL — ABNORMAL LOW (ref 4.20–5.82)
RDW: 14.3 % (ref 11.0–14.6)
RETIC CT ABS: 51.34 10*3/uL (ref 34.80–93.90)
Retic %: 1.29 % (ref 0.80–1.80)
WBC: 6.1 10*3/uL (ref 4.0–10.3)

## 2016-09-09 LAB — IRON AND TIBC
%SAT: 37 % (ref 20–55)
IRON: 137 ug/dL (ref 42–163)
TIBC: 370 ug/dL (ref 202–409)
UIBC: 233 ug/dL (ref 117–376)

## 2016-09-09 NOTE — Progress Notes (Signed)
Patient Care Team: Gaspar Garbe, MD as PCP - General (Internal Medicine)  DIAGNOSIS:  Encounter Diagnosis  Name Primary?  . Iron deficiency anemia, unspecified iron deficiency anemia type    CHIEF COMPLIANT: Follow-up of iron and folic acid deficiency  INTERVAL HISTORY: Bruce Mann is a 83 year old with above-mentioned history of iron deficiency anemia who received IV iron treatment in November 2017. He is here for routine follow-up. He reports that since IV iron infusion his felt remarkably better. Energy levels are recovered mostly to normal levels. Denies any new problems or concerns.  REVIEW OF SYSTEMS:   Constitutional: Denies fevers, chills or abnormal weight loss Eyes: Denies blurriness of vision Ears, nose, mouth, throat, and face: Denies mucositis or sore throat Respiratory: Denies cough, dyspnea or wheezes Cardiovascular: Denies palpitation, chest discomfort Gastrointestinal:  Denies nausea, heartburn or change in bowel habits Skin: Denies abnormal skin rashes Lymphatics: Denies new lymphadenopathy or easy bruising Neurological:Denies numbness, tingling or new weaknesses Behavioral/Psych: Mood is stable, no new changes  Extremities: No lower extremity edema  All other systems were reviewed with the patient and are negative.  I have reviewed the past medical history, past surgical history, social history and family history with the patient and they are unchanged from previous note.  ALLERGIES:  has No Known Allergies.  MEDICATIONS:  Current Outpatient Prescriptions  Medication Sig Dispense Refill  . atorvastatin (LIPITOR) 80 MG tablet Take 80 mg by mouth daily.    . Calcium Carbonate-Vitamin D (CALCIUM PLUS VITAMIN D PO) Take 1 tablet by mouth daily.     . carvedilol (COREG) 3.125 MG tablet Take 1 tablet (3.125 mg total) by mouth 2 (two) times daily with a meal. 60 tablet 6  . Choline Fenofibrate (TRILIPIX) 135 MG capsule Take 135 mg by mouth daily.    [name] Calcium (STOOL SOFTENER PO) Take 1-3 Doses by mouth daily.    . folic acid (FOLVITE) 1 MG tablet Take 1 tablet (1 mg total) by mouth daily. 90 tablet 3  . furosemide (LASIX) 40 MG tablet Take 1 tablet (40 mg total) by mouth daily. 30 tablet 6  . losartan (COZAAR) 25 MG tablet Take 0.5 tablets (12.5 mg total) by mouth at bedtime. 18 tablet 6  . Multiple Vitamin (MULTIVITAMIN) tablet Take 1 tablet by mouth daily.    . nitroGLYCERIN (NITROSTAT) 0.4 MG SL tablet Place 1 tablet (0.4 mg total) under the tongue every 5 (five) minutes as needed for chest pain. 25 tablet 6  . Omega-3 Fatty Acids (FISH OIL) 1000 MG CAPS Take 1,000 mg by mouth daily.    . potassium chloride SA (K-DUR,KLOR-CON) 20 MEQ tablet Take 1 tablet (20 mEq total) by mouth daily. 30 tablet 6  . ranitidine (ZANTAC) 150 MG tablet Take 150 mg by mouth 2 (two) times daily.    . Rivaroxaban (XARELTO) 15 MG TABS tablet Take 15 mg by mouth daily with supper.     No current facility-administered medications for this visit.     PHYSICAL EXAMINATION: ECOG PERFORMANCE STATUS: 1 - Symptomatic but completely ambulatory  Vitals:   09/09/16 1505  BP: (!) 126/49  Pulse: 76  Resp: 17  Temp: 97.7 F (36.5 C)   Filed Weights   09/09/16 1505  Weight: 168 lb 12.8 oz (76.6 kg)    GENERAL:alert, no distress and comfortable SKIN: skin color, texture, turgor are normal, no rashes or significant lesions EYES: normal, Conjunctiva are pink and non-injected, sclera clear OROPHARYNX:no exudate, no erythema  and lips, buccal mucosa, and tongue normal  NECK: supple, thyroid normal size, non-tender, without nodularity LYMPH:  no palpable lymphadenopathy in the cervical, axillary or inguinal LUNGS: clear to auscultation and percussion with normal breathing effort HEART: regular rate & rhythm and no murmurs and no lower extremity edema ABDOMEN:abdomen soft, non-tender and normal bowel sounds MUSCULOSKELETAL:no cyanosis of digits and no  clubbing  NEURO: alert & oriented x 3 with fluent speech, no focal motor/sensory deficits EXTREMITIES: No lower extremity edema  LABORATORY DATA:  I have reviewed the data as listed   Chemistry      Component Value Date/Time   NA 133 (L) 04/25/2016 1143   K 4.6 04/25/2016 1143   CL 98 04/25/2016 1143   CO2 26 04/25/2016 1143   BUN 36 (H) 04/25/2016 1143   CREATININE 1.43 (H) 04/25/2016 1143      Component Value Date/Time   CALCIUM 9.7 04/25/2016 1143   ALKPHOS 34 (L) 04/17/2016 0420   AST 133 (H) 04/17/2016 0420   ALT 75 (H) 04/17/2016 0420   BILITOT 1.3 (H) 04/17/2016 0420       Lab Results  Component Value Date   WBC 6.1 09/09/2016   HGB 12.8 (L) 09/09/2016   HCT 38.9 09/09/2016   MCV 97.7 09/09/2016   PLT 147 09/09/2016   NEUTROABS 3.7 09/09/2016    ASSESSMENT & PLAN:  Iron deficiency anemia Normocytic anemia and thrombocytopenia: Referred by Dr.Tisovec Hemoglobin 9.6 with MCV 98.4 and platelets 138 or 03/20/2016 (WBC count and differential are normal) Hospitalization 04/15/2016 to10/20/2017 for severe fatigue (felt to be due to CHF and anemia): Hemoglobin 8.6, MCV 82, platelets 107, ferritin 31, TIBC 542 and 7% iron saturation, B-12 583, folic acid 5.110/20/2017  Differential:Combined folic acid and iron deficiency anemia In the hospital stool Hemoccults were negative. Patient is currently on blood thinners so potentially this may be related to intermittent occult bleeding. Otherwise malabsorption as a possible etiology as well.  Treatment: 1. Folic acid supplementation 2. IV iron infusion 05/07/16 X 2 doses  Lab Review12/05/2016 : Hemoglobin improved from 8.6-11.8, platelets stable at 125, MCV improved from 82-95. Iron studies revealed ferritin of 168 and iron saturation of 33%. Lab review 09/09/2016:  RTC in 6 months with labs   I spent 25 minutes talking to the patient of which more than half was spent in counseling and coordination of care.  No  orders of the defined types were placed in this encounter.  The patient has a good understanding of the overall plan. he agrees with it. he will call with any problems that may develop before the next visit here.   Sabas SousGudena, Lenda Baratta K, MD 09/09/16

## 2016-09-09 NOTE — Assessment & Plan Note (Signed)
Normocytic anemia and thrombocytopenia: Referred by Dr.Tisovec Hemoglobin 9.6 with MCV 98.4 and platelets 138 or 03/20/2016 (WBC count and differential are normal) Hospitalization 04/15/2016 to10/20/2017 for severe fatigue (felt to be due to CHF and anemia): Hemoglobin 8.6, MCV 82, platelets 107, ferritin 31, TIBC 542 and 7% iron saturation, B-12 583, folic acid 5.110/20/2017  Differential:Combined folic acid and iron deficiency anemia In the hospital stool Hemoccults were negative. Patient is currently on blood thinners so potentially this may be related to intermittent occult bleeding. Otherwise malabsorption as a possible etiology as well.  Treatment: 1. Folic acid supplementation 2. IV iron infusion 05/07/16 X 2 doses  Lab Review12/05/2016 : Hemoglobin improved from 8.6-11.8, platelets stable at 125, MCV improved from 82-95. Iron studies revealed ferritin of 168 and iron saturation of 33%. Lab review 09/09/2016:  RTC in 6 months with labs

## 2016-09-10 LAB — FOLATE: Folate: >20 ng/mL (ref >3.0)

## 2016-11-06 ENCOUNTER — Other Ambulatory Visit (HOSPITAL_COMMUNITY): Payer: Self-pay | Admitting: Student

## 2016-11-13 ENCOUNTER — Other Ambulatory Visit (HOSPITAL_COMMUNITY): Payer: Self-pay | Admitting: Student

## 2016-12-12 ENCOUNTER — Other Ambulatory Visit: Payer: Self-pay | Admitting: Cardiology

## 2016-12-12 DIAGNOSIS — I2510 Atherosclerotic heart disease of native coronary artery without angina pectoris: Secondary | ICD-10-CM

## 2016-12-12 NOTE — Telephone Encounter (Signed)
REFILL 

## 2016-12-26 ENCOUNTER — Telehealth: Payer: Self-pay | Admitting: Cardiology

## 2016-12-26 ENCOUNTER — Other Ambulatory Visit (HOSPITAL_COMMUNITY): Payer: Self-pay | Admitting: Student

## 2016-12-26 NOTE — Telephone Encounter (Signed)
New message   Pt c/o medication issue:  1. Name of Medication: furosemide (LASIX) 40 MG tablet  2. How are you currently taking this medication (dosage and times per day)? 40mg   3. Are you having a reaction (difficulty breathing--STAT)? na  4. What is your medication issue? Pt wants to know if he can take a lower dose due to having to go to the bathroom all the time

## 2016-12-26 NOTE — Telephone Encounter (Signed)
S/w Allen NorrisNancy Morro (spouse) notified to continue current dose unless getting any s/e and call back she will have him continue current dosing, he just states that this is inconvenient to be in the bathroom all the time. She will call back if any issues arise.

## 2016-12-29 NOTE — Telephone Encounter (Signed)
This is Dr. Jordan's pt. °

## 2016-12-29 NOTE — Telephone Encounter (Signed)
REFILL 

## 2016-12-29 NOTE — Telephone Encounter (Signed)
FOLLOWED BY [place]

## 2017-01-01 ENCOUNTER — Other Ambulatory Visit (HOSPITAL_COMMUNITY): Payer: Self-pay | Admitting: Student

## 2017-01-05 NOTE — Telephone Encounter (Signed)
Followed by Otho PerlJodan

## 2017-02-11 ENCOUNTER — Encounter: Payer: Self-pay | Admitting: Cardiology

## 2017-03-01 NOTE — Progress Notes (Signed)
Bruce Mann Date of Birth: [birth year] Medical Record #[id number]  History of Present Illness: Bruce Mann is seen for followup CAD, diastolic CHF,  and atrial fibrillation.  He has a  history of coronary disease. He had angioplasty of the first obtuse marginal vessel may of 2001. In 2009 he had an abnormal stress test which led to heart catheterization. This demonstrated moderate three-vessel obstructive coronary disease. LV function was normal at that time. He was asymptomatic. We recommended medical management. He also has a history of atrial fibrillation. This is managed with rate control and anticoagulation.  He was admitted in October 2017 with acute diastolic CHF and left pleural effusion. Echo showed normal EF, biatrial enlargement, mild pulmonary HTN. He was diuresed with improvement. Discharged on Coreg, ARB, and diuretics. Initially had orthostatic hypotension. Meds adjusted. BNP up to 1160. Repeat CXR on 10/27 showed resolution of pulmonary edema and improved but persistent effusions. He was also anemic with iron deficiency and started on iron therapy. Discharge weight was 159 lbs.  On follow up today he is doing well.  He is seen with his wife. Breathing is good. [facility] and [facility] mostly on a bike once a week. Weight at home has been increasing as well. Wife notes he has gained a paunch.BP and HR are controlled. No increase edema. No PND, orthopnea. No chest pain or dizziness. He is concerned that several friends have had to have carotid surgery and wants his carotids checked.   Current Outpatient Prescriptions on File Prior to Visit  Medication Sig Dispense Refill  . atorvastatin (LIPITOR) 80 MG tablet Take 80 mg by mouth daily.    . Calcium Carbonate-Vitamin D (CALCIUM PLUS VITAMIN D PO) Take 1 tablet by mouth daily.     . carvedilol (COREG) 3.125 MG tablet TAKE ONE TABLET BY MOUTH TWICE DAILY WITH A MEAL 180 tablet 4  . Choline Fenofibrate (TRILIPIX) 135 MG capsule  Take 135 mg by mouth daily.    Bruce Mann Calcium (STOOL SOFTENER PO) Take 1-3 Doses by mouth daily.    . folic acid (FOLVITE) 1 MG tablet Take 1 tablet (1 mg total) by mouth daily. 90 tablet 3  . furosemide (LASIX) 40 MG tablet TAKE ONE TABLET BY MOUTH ONCE DAILY 30 tablet 6  . KLOR-CON M20 20 MEQ tablet TAKE ONE TABLET BY MOUTH ONCE DAILY 30 tablet 3  . losartan (COZAAR) 25 MG tablet TAKE ONE-HALF TABLET BY MOUTH AT BEDTIME 45 tablet 2  . Multiple Vitamin (MULTIVITAMIN) tablet Take 1 tablet by mouth daily.    Marland Kitchen NITROSTAT 0.4 MG SL tablet PLACE ONE TABLET UNDER THE TONGUE EVERY 5 MINUTES AS NEEDED FOR CHEST PAIN 75 tablet 3  . Omega-3 Fatty Acids (FISH OIL) 1000 MG CAPS Take 1,000 mg by mouth daily.    . ranitidine (ZANTAC) 150 MG tablet Take 150 mg by mouth 2 (two) times daily.    . Rivaroxaban (XARELTO) 15 MG TABS tablet Take 15 mg by mouth daily with supper.     No current facility-administered medications on file prior to visit.     No Known Allergies  Past Medical History:  Diagnosis Date  . Anemia   . Atrial fibrillation (HCC)   . CAD (coronary artery disease)   . CHF (congestive heart failure) (HCC) 03/2016   NEW ONSET  . Diabetes mellitus type II   . Dyslipidemia   . Hyperlipidemia   . Hypertension   . Weakness 03/2016  Past Surgical History:  Procedure Laterality Date  . CARDIAC CATHETERIZATION     Ejection Fraction 60%  . CATARACT EXTRACTION, BILATERAL    . COLONOSCOPY    . TONSILLECTOMY      History  Smoking Status  . Never Smoker  Smokeless Tobacco  . Never Used    History  Alcohol Use No    Family History  Problem Relation Age of Onset  . Stroke Mother   . Heart attack Father   . Heart attack Brother   . Colon cancer Neg Hx     Review of Systems: As noted in history of present illness.  All other systems were reviewed and are negative.  Physical Exam: BP (!) 148/70   Pulse 63   Ht 5\' 8"  (1.727 m)   Wt 177 lb 3.2 oz (80.4 kg)   BMI  26.94 kg/m  He is a pleasant white male in no acute distress. HEENT: Normal. Neck is supple without JVD, adenopathy, thyromegaly, or bruits. Lungs: Clear Cardiovascular: Irregular rate and rhythm without gallop, murmur, or click. Normal S1 and S2. Abdomen: Soft and nontender. No hepatosplenomegaly or masses. Bowel sounds are positive. Extremities: No cyanosis, edema. Pedal pulses are 2+ and symmetric. Skin: Warm and dry. Neuro: Alert and oriented x3. Cranial nerves II through XII are intact. He has no focal motor or sensory deficits.  LABORATORY DATA: Lab Results  Component Value Date   WBC 6.1 09/09/2016   HGB 12.8 (L) 09/09/2016   HCT 38.9 09/09/2016   PLT 147 09/09/2016   GLUCOSE 119 (H) 04/25/2016   ALT 75 (H) 04/17/2016   AST 133 (H) 04/17/2016   NA 133 (L) 04/25/2016   K 4.6 04/25/2016   CL 98 04/25/2016   CREATININE 1.43 (H) 04/25/2016   BUN 36 (H) 04/25/2016   CO2 26 04/25/2016   TSH 5.557 (H) 04/16/2016    Labs dated 03/12/16: cholesterol 72, triglycerides 81, HDL 17, LDL 39. A1c 5.3%. Dated 09/15/16: A1c 5.9%   Echo: 04/17/16:Study Conclusions  - Left ventricle: The cavity size was normal. Wall thickness was   increased in a pattern of moderate LVH. Systolic function was   normal. The estimated ejection fraction was in the range of 55%   to 60%. Wall motion was normal; there were no regional wall   motion abnormalities. - Mitral valve: Moderately calcified annulus. - Left atrium: The atrium was mildly dilated. - Right ventricle: The cavity size was mildly dilated. - Right atrium: The atrium was severely dilated. - Pulmonary arteries: Systolic pressure was moderately increased.   PA peak pressure: 44 mm Hg (S). - Pericardium, extracardiac: There was a left pleural effusion  Ecg today shows AFib with rate 63. Otherwise normal. I have personally reviewed and interpreted this study.   Assessment / Plan: 1. Atrial fibrillation-permanent. HR is well controlled.  We will continue Coreg at current dose. Continue Xarelto 15 mg daily adjusted for age and CKD.   2. Coronary disease. He had moderate three-vessel disease by cardiac catheterization in 2009. He is asymptomatic. We will continue medical management with beta blocker and ARB,  Continue statin therapy.  3. Chronic diastolic CHF. New onset in October 2017 with pleural effusions. Clinically improved. Weight is up 18 lbs from last year but really no evidence of fluid retention on exam. He is less active and admits to eating more sweets.  Continue current diuretic therapy.  Sodium restriction. Recommend he eat less sweets and try and lose weight. Increase aerobic activity.  4. Hypercholesterolemia continue high-dose statin and fish oil. Scheduled for follow up lab work with primary Care October 1.   5. Diabetes mellitus type 2 on oral therapy. Followed by Dr. Wylene Simmer.   6. HTN  7. Fe deficiency anemia. Per hematology.  I will follow up in 6 months. At patient request will do screening carotid dopplers.

## 2017-03-05 ENCOUNTER — Encounter: Payer: Self-pay | Admitting: Cardiology

## 2017-03-05 ENCOUNTER — Ambulatory Visit (INDEPENDENT_AMBULATORY_CARE_PROVIDER_SITE_OTHER): Payer: Medicare Other | Admitting: Cardiology

## 2017-03-05 VITALS — BP 148/70 | HR 63 | Ht 68.0 in | Wt 177.2 lb

## 2017-03-05 DIAGNOSIS — I4821 Permanent atrial fibrillation: Secondary | ICD-10-CM

## 2017-03-05 DIAGNOSIS — E7800 Pure hypercholesterolemia, unspecified: Secondary | ICD-10-CM

## 2017-03-05 DIAGNOSIS — I482 Chronic atrial fibrillation: Secondary | ICD-10-CM

## 2017-03-05 DIAGNOSIS — I5032 Chronic diastolic (congestive) heart failure: Secondary | ICD-10-CM | POA: Diagnosis not present

## 2017-03-05 DIAGNOSIS — I2510 Atherosclerotic heart disease of native coronary artery without angina pectoris: Secondary | ICD-10-CM | POA: Diagnosis not present

## 2017-03-05 MED ORDER — POTASSIUM CHLORIDE CRYS ER 20 MEQ PO TBCR
20.0000 meq | EXTENDED_RELEASE_TABLET | Freq: Every day | ORAL | 3 refills | Status: DC
Start: 2017-03-05 — End: 2018-03-08

## 2017-03-05 MED ORDER — RANITIDINE HCL 150 MG PO TABS: 150.0000 mg | ORAL_TABLET | Freq: Two times a day (BID) | ORAL | 3 refills | Status: DC

## 2017-03-05 MED ORDER — NITROSTAT 0.4 MG SL SUBL: SUBLINGUAL_TABLET | SUBLINGUAL | 11 refills | Status: DC

## 2017-03-05 MED ORDER — CHOLINE FENOFIBRATE 135 MG PO CPDR: 135.0000 mg | DELAYED_RELEASE_CAPSULE | Freq: Every day | ORAL | 3 refills | Status: DC

## 2017-03-05 MED ORDER — RIVAROXABAN 15 MG PO TABS: 15.0000 mg | ORAL_TABLET | Freq: Every day | ORAL | 3 refills | Status: AC

## 2017-03-05 MED ORDER — FISH OIL 1000 MG PO CAPS: 1000.0000 mg | ORAL_CAPSULE | Freq: Every day | ORAL | 3 refills | Status: DC

## 2017-03-05 MED ORDER — LOSARTAN POTASSIUM 25 MG PO TABS: 12.5000 mg | ORAL_TABLET | Freq: Every day | ORAL | 3 refills | Status: DC

## 2017-03-05 MED ORDER — ATORVASTATIN CALCIUM 80 MG PO TABS: 80.0000 mg | ORAL_TABLET | Freq: Every day | ORAL | 3 refills | Status: DC

## 2017-03-05 MED ORDER — CARVEDILOL 3.125 MG PO TABS: 3.1250 mg | ORAL_TABLET | Freq: Two times a day (BID) | ORAL | 3 refills | Status: DC

## 2017-03-05 MED ORDER — FUROSEMIDE 40 MG PO TABS: 40.0000 mg | ORAL_TABLET | Freq: Every day | ORAL | 3 refills | Status: DC

## 2017-03-05 NOTE — Patient Instructions (Signed)
Continue your current therapy  Work on modifying your diet and lose weight. Less sweets.  Increase aerobic activity  I will see you in 6 months.  We will schedule you for carotid dopplers.

## 2017-03-05 NOTE — Addendum Note (Signed)
Addended by: Neoma LamingPUGH, Wilberth Damon J on: 03/05/2017 10:20 AM   Modules accepted: Orders

## 2017-03-05 NOTE — Addendum Note (Signed)
Addended by: Neoma LamingPUGH, Laveah Gloster J on: 03/05/2017 10:41 AM   Modules accepted: Orders

## 2017-03-06 ENCOUNTER — Other Ambulatory Visit: Payer: Self-pay | Admitting: Cardiology

## 2017-03-06 DIAGNOSIS — E7800 Pure hypercholesterolemia, unspecified: Secondary | ICD-10-CM

## 2017-03-06 DIAGNOSIS — I2510 Atherosclerotic heart disease of native coronary artery without angina pectoris: Secondary | ICD-10-CM

## 2017-03-10 ENCOUNTER — Ambulatory Visit (HOSPITAL_COMMUNITY)
Admission: RE | Admit: 2017-03-10 | Discharge: 2017-03-10 | Disposition: A | Discharge status: Home / Self Care | Payer: Medicare Other | Source: Ambulatory Visit | Attending: Cardiovascular Disease | Admitting: Cardiovascular Disease

## 2017-03-10 ENCOUNTER — Telehealth: Payer: Self-pay

## 2017-03-10 DIAGNOSIS — I2510 Atherosclerotic heart disease of native coronary artery without angina pectoris: Secondary | ICD-10-CM

## 2017-03-10 DIAGNOSIS — E7800 Pure hypercholesterolemia, unspecified: Secondary | ICD-10-CM

## 2017-03-10 NOTE — Telephone Encounter (Signed)
  Medication samples have been provided to the patient.  Drug name: xarelto 15 mg  Qty: 5 bottles  LOT: [id number]  Exp.Date:12/20  Samples left at front desk for patient pick-up. Patient notified.  Bruce Mann, Bruce Mann 3:52 PM 03/10/2017

## 2017-03-12 ENCOUNTER — Ambulatory Visit (HOSPITAL_BASED_OUTPATIENT_CLINIC_OR_DEPARTMENT_OTHER): Payer: Medicare Other | Admitting: Hematology and Oncology

## 2017-03-12 ENCOUNTER — Other Ambulatory Visit (HOSPITAL_BASED_OUTPATIENT_CLINIC_OR_DEPARTMENT_OTHER): Payer: Medicare Other

## 2017-03-12 ENCOUNTER — Encounter: Payer: Self-pay | Admitting: Hematology and Oncology

## 2017-03-12 DIAGNOSIS — D509 Iron deficiency anemia, unspecified: Secondary | ICD-10-CM | POA: Diagnosis not present

## 2017-03-12 LAB — IRON AND TIBC
%SAT: 30 % (ref 20–55)
IRON: 118 ug/dL (ref 42–163)
TIBC: 386 ug/dL (ref 202–409)
UIBC: 269 ug/dL (ref 117–376)

## 2017-03-12 LAB — CBC & DIFF AND RETIC
BASO%: 0.5 % (ref 0.0–2.0)
BASOS ABS: 0 10*3/uL (ref 0.0–0.1)
EOS ABS: 0.2 10*3/uL (ref 0.0–0.5)
EOS%: 2.6 % (ref 0.0–7.0)
HEMATOCRIT: 40.8 % (ref 38.4–49.9)
HEMOGLOBIN: 13.4 g/dL (ref 13.0–17.1)
IMMATURE RETIC FRACT: 7.4 % (ref 3.00–10.60)
LYMPH#: 1.8 10*3/uL (ref 0.9–3.3)
LYMPH%: 28.1 % (ref 14.0–49.0)
MCH: 31 pg (ref 27.2–33.4)
MCHC: 32.8 g/dL (ref 32.0–36.0)
MCV: 94.4 fL (ref 79.3–98.0)
MONO#: 0.7 10*3/uL (ref 0.1–0.9)
MONO%: 10.5 % (ref 0.0–14.0)
NEUT#: 3.6 10*3/uL (ref 1.5–6.5)
NEUT%: 58.3 % (ref 39.0–75.0)
Platelets: 157 10*3/uL (ref 140–400)
RBC: 4.32 10*6/uL (ref 4.20–5.82)
RDW: 14.5 % (ref 11.0–14.6)
RETIC %: 1.64 % (ref 0.80–1.80)
RETIC CT ABS: 70.85 10*3/uL (ref 34.80–93.90)
WBC: 6.2 10*3/uL (ref 4.0–10.3)
nRBC: 0 % (ref 0–0)

## 2017-03-12 LAB — FERRITIN: FERRITIN: 109 ng/mL (ref 22–316)

## 2017-03-12 NOTE — Assessment & Plan Note (Signed)
Normocytic anemia and thrombocytopenia: Referred by Dr.Tisovec Hemoglobin 9.6 with MCV 98.4 and platelets 138 or 03/20/2016 (WBC count and differential are normal) Hospitalization 04/15/2016 to10/20/2017 for severe fatigue (felt to be due to CHF and anemia): Hemoglobin 8.6, MCV 82, platelets 107, ferritin 31, TIBC 542 and 7% iron saturation, B-12 583, folic acid 5.110/20/2017  Differential:Combined folic acid and iron deficiency anemia In the hospital stool Hemoccults were negative. Patient is currently on blood thinners so potentially this may be related to intermittent occult bleeding. Otherwise malabsorption as a possible etiology as well.  Treatment: 1. Folic acid supplementation 2. IV iron infusion 05/07/16 X 2 doses  Lab Review  RTC in 6 months with labs

## 2017-03-12 NOTE — Progress Notes (Signed)
Patient Care Team: Tisovec, Adelfa Koh, MD as PCP - General (Internal Medicine)  DIAGNOSIS:  Encounter Diagnosis  Name Primary?  . Iron deficiency anemia, unspecified iron deficiency anemia type     CHIEF COMPLIANT: Follow-up of iron deficiency anemia and folic acid deficiency  INTERVAL HISTORY: Bruce Mann is a 83 year old with above-mentioned history of iron deficiency anemia frequent IV iron therapy in October 2017. He has not required any IV iron since then. His daughter reports to me that he is doing quite well. This is the first summer where he was able to enjoy it. Previously he used to feel cold all the time. Denies any fatigue or shortness of breath. He does have arthritis. He takes Lasix for congestive heart failure which keeps him up at night because he has to go to bathroom multiple times.  REVIEW OF SYSTEMS:   Constitutional: Denies fevers, chills or abnormal weight loss Eyes: Denies blurriness of vision Ears, nose, mouth, throat, and face: Denies mucositis or sore throat Respiratory: Denies cough, dyspnea or wheezes Cardiovascular: Denies palpitation, chest discomfort Gastrointestinal:  Denies nausea, heartburn or change in bowel habits Skin: Denies abnormal skin rashes Lymphatics: Denies new lymphadenopathy or easy bruising Neurological:Denies numbness, tingling or new weaknesses Behavioral/Psych: Mood is stable, no new changes  Extremities: No lower extremity edema All other systems were reviewed with the patient and are negative.  I have reviewed the past medical history, past surgical history, social history and family history with the patient and they are unchanged from previous note.  ALLERGIES:  has No Known Allergies.  MEDICATIONS:  Current Outpatient Prescriptions  Medication Sig Dispense Refill  . atorvastatin (LIPITOR) 80 MG tablet Take 1 tablet (80 mg total) by mouth daily. 90 tablet 3  . Calcium Carbonate-Vitamin D (CALCIUM PLUS VITAMIN D PO) Take 1  tablet by mouth daily.     . carvedilol (COREG) 3.125 MG tablet Take 1 tablet (3.125 mg total) by mouth 2 (two) times daily with a meal. 180 tablet 3  . Choline Fenofibrate (TRILIPIX) 135 MG capsule Take 1 capsule (135 mg total) by mouth daily. 90 capsule 3  . Docusate Calcium (STOOL SOFTENER PO) Take 1-3 Doses by mouth daily.    . folic acid (FOLVITE) 1 MG tablet Take 1 tablet (1 mg total) by mouth daily. 90 tablet 3  . furosemide (LASIX) 40 MG tablet Take 1 tablet (40 mg total) by mouth daily. 90 tablet 3  . losartan (COZAAR) 25 MG tablet Take 0.5 tablets (12.5 mg total) by mouth at bedtime. 45 tablet 3  . Multiple Vitamin (MULTIVITAMIN) tablet Take 1 tablet by mouth daily.    Marland Kitchen NITROSTAT 0.4 MG SL tablet PLACE ONE TABLET UNDER THE TONGUE EVERY 5 MINUTES AS NEEDED FOR CHEST PAIN 25 tablet 11  . Omega-3 Fatty Acids (FISH OIL) 1000 MG CAPS Take 1 capsule (1,000 mg total) by mouth daily. 90 capsule 3  . potassium chloride SA (KLOR-CON M20) 20 MEQ tablet Take 1 tablet (20 mEq total) by mouth daily. 90 tablet 3  . ranitidine (ZANTAC) 150 MG tablet Take 1 tablet (150 mg total) by mouth 2 (two) times daily. 180 tablet 3  . Rivaroxaban (XARELTO) 15 MG TABS tablet Take 1 tablet (15 mg total) by mouth daily with supper. 90 tablet 3   No current facility-administered medications for this visit.     PHYSICAL EXAMINATION: ECOG PERFORMANCE STATUS: 1 - Symptomatic but completely ambulatory  Vitals:   03/12/17 1423  BP: (!) 141/60  Pulse: 69  Resp: 18  Temp: (!) 97.5 F (36.4 C)  SpO2: 99%   Filed Weights   03/12/17 1423  Weight: 174 lb 1.6 oz (79 kg)    GENERAL:alert, no distress and comfortable SKIN: skin color, texture, turgor are normal, no rashes or significant lesions EYES: normal, Conjunctiva are pink and non-injected, sclera clear OROPHARYNX:no exudate, no erythema and lips, buccal mucosa, and tongue normal  NECK: supple, thyroid normal size, non-tender, without nodularity LYMPH:  no  palpable lymphadenopathy in the cervical, axillary or inguinal LUNGS: clear to auscultation and percussion with normal breathing effort HEART: regular rate & rhythm and no murmurs and no lower extremity edema ABDOMEN:abdomen soft, non-tender and normal bowel sounds MUSCULOSKELETAL:no cyanosis of digits and no clubbing  NEURO: alert & oriented x 3 with fluent speech, no focal motor/sensory deficits EXTREMITIES: No lower extremity edema  LABORATORY DATA:  I have reviewed the data as listed   Chemistry      Component Value Date/Time   NA 133 (L) 04/25/2016 1143   K 4.6 04/25/2016 1143   CL 98 04/25/2016 1143   CO2 26 04/25/2016 1143   BUN 36 (H) 04/25/2016 1143   CREATININE 1.43 (H) 04/25/2016 1143      Component Value Date/Time   CALCIUM 9.7 04/25/2016 1143   ALKPHOS 34 (L) 04/17/2016 0420   AST 133 (H) 04/17/2016 0420   ALT 75 (H) 04/17/2016 0420   BILITOT 1.3 (H) 04/17/2016 0420       Lab Results  Component Value Date   WBC 6.2 03/12/2017   HGB 13.4 03/12/2017   HCT 40.8 03/12/2017   MCV 94.4 03/12/2017   PLT 157 03/12/2017   NEUTROABS 3.6 03/12/2017    ASSESSMENT & PLAN:  Iron deficiency anemia Normocytic anemia and thrombocytopenia: Referred by Dr.Tisovec Hemoglobin 9.6 with MCV 98.4 and platelets 138 or 03/20/2016 (WBC count and differential are normal) Hospitalization 04/15/2016 to10/20/2017 for severe fatigue (felt to be due to CHF and anemia): IV iron infusion 05/07/16 X 2 doses  Differential:Combined folic acid and iron deficiency anemia In the hospital stool Hemoccults were negative. Patient is currently on blood thinners so potentially this may be related to intermittent occult bleeding. Otherwise malabsorption as a possible etiology as well.  Treatment: Folic acid supplementation  Lab Review: Today's hemoglobin is 13.4. Iron studies are pending but I suspect they will be normal.  RTC in 12 months with labs   I spent 25 minutes talking to the  patient of which more than half was spent in counseling and coordination of care.  Orders Placed This Encounter  Procedures  . CBC with Differential    Standing Status:   Future    Standing Expiration Date:   03/12/2018  . Ferritin    Standing Status:   Future    Standing Expiration Date:   03/12/2018  . Iron and TIBC    Standing Status:   Future    Standing Expiration Date:   03/12/2018  . Folate, Serum    Standing Status:   Future    Standing Expiration Date:   03/12/2018   The patient has a good understanding of the overall plan. he agrees with it. he will call with any problems that may develop before the next visit here.   Sabas SousGudena, Christin Moline K, MD 03/12/17

## 2017-03-13 LAB — FOLATE: Folate: >20 ng/mL (ref >3.0)

## 2017-04-16 ENCOUNTER — Other Ambulatory Visit: Payer: Self-pay | Admitting: Hematology and Oncology

## 2017-05-30 DIAGNOSIS — B029 Zoster without complications: Secondary | ICD-10-CM

## 2017-05-30 HISTORY — DX: Zoster without complications: B02.9

## 2017-09-19 NOTE — Progress Notes (Signed)
Bruce Mann Date of Birth: 13-Nov-1930 Medical Record #[id number]  History of Present Illness: Bruce Mann is seen for followup CAD, diastolic CHF,  and atrial fibrillation.  He has a  history of coronary disease. He had angioplasty of the first obtuse marginal vessel may of 2001. In 2009 he had an abnormal stress test which led to heart catheterization. This demonstrated moderate three-vessel obstructive coronary disease. LV function was normal at that time. He was asymptomatic. We recommended medical management. He also has a history of atrial fibrillation. This is managed with rate control and anticoagulation.  He was admitted in October 2017 with acute diastolic CHF and left pleural effusion. Echo showed normal EF, biatrial enlargement, mild pulmonary HTN. He was diuresed with improvement. Discharged on Coreg, ARB, and diuretics. Initially had orthostatic hypotension. Meds adjusted. BNP up to 1160. Repeat CXR on 10/27 showed resolution of pulmonary edema and improved but persistent effusions. He was also anemic with iron deficiency and started on iron therapy.   On follow up today he is doing well.  He is seen with his wife. He denies any dyspnea or chest pain. No edema. [facility] on a bike twice  a week. Thinking of doing some strength training as well. Weight at home has been stable.   Current Outpatient Medications on File Prior to Visit  Medication Sig Dispense Refill  . Acetaminophen (TYLENOL 8 HOUR PO) Take 2 tablets by mouth as needed.    Marland Kitchen. atorvastatin (LIPITOR) 80 MG tablet Take 1 tablet (80 mg total) by mouth daily. 90 tablet 3  . Calcium Carbonate-Vitamin D (CALCIUM PLUS VITAMIN D PO) Take 1 tablet by mouth daily.     . carvedilol (COREG) 3.125 MG tablet Take 1 tablet (3.125 mg total) by mouth 2 (two) times daily with a meal. 180 tablet 3  . Choline Fenofibrate (TRILIPIX) 135 MG capsule Take 1 capsule (135 mg total) by mouth daily. 90 capsule 3  .  Docusate Calcium (STOOL SOFTENER PO) Take 1-3 Doses by mouth daily.    . folic acid (FOLVITE) 1 MG tablet TAKE ONE TABLET BY MOUTH ONCE DAILY 90 tablet 3  . furosemide (LASIX) 40 MG tablet Take 1 tablet (40 mg total) by mouth daily. 90 tablet 3  . loratadine (CLARITIN) 10 MG tablet Take 10 mg by mouth daily.    Marland Kitchen. losartan (COZAAR) 25 MG tablet Take 0.5 tablets (12.5 mg total) by mouth at bedtime. 45 tablet 3  . Multiple Vitamin (MULTIVITAMIN) tablet Take 1 tablet by mouth daily.    Marland Kitchen. NITROSTAT 0.4 MG SL tablet PLACE ONE TABLET UNDER THE TONGUE EVERY 5 MINUTES AS NEEDED FOR CHEST PAIN 25 tablet 11  . Omega-3 Fatty Acids (FISH OIL) 1000 MG CAPS Take 1 capsule (1,000 mg total) by mouth daily. 90 capsule 3  . potassium chloride SA (KLOR-CON M20) 20 MEQ tablet Take 1 tablet (20 mEq total) by mouth daily. 90 tablet 3  . ranitidine (ZANTAC) 150 MG tablet Take 1 tablet (150 mg total) by mouth 2 (two) times daily. 180 tablet 3  . Rivaroxaban (XARELTO) 15 MG TABS tablet Take 1 tablet (15 mg total) by mouth daily with supper. 90 tablet 3   No current facility-administered medications on file prior to visit.     No Known Allergies  Past Medical History:  Diagnosis Date  . Anemia   . Atrial fibrillation (HCC)   . CAD (coronary artery disease)   . CHF (congestive  heart failure) (HCC) 03/2016   NEW ONSET  . Diabetes mellitus type II   . Dyslipidemia   . Hyperlipidemia   . Hypertension   . Weakness 03/2016    Past Surgical History:  Procedure Laterality Date  . CARDIAC CATHETERIZATION     Ejection Fraction 60%  . CATARACT EXTRACTION, BILATERAL    . COLONOSCOPY    . TONSILLECTOMY      Social History   Tobacco Use  Smoking Status Never Smoker  Smokeless Tobacco Never Used    Social History   Substance and Sexual Activity  Alcohol Use No    Family History  Problem Relation Age of Onset  . Stroke Mother   . Heart attack Father   . Heart attack Brother   . Colon cancer Neg Hx      Review of Systems: As noted in history of present illness.  All other systems were reviewed and are negative.  Physical Exam: BP 130/70   Pulse 84   Ht 5\' 8"  (1.727 m)   Wt 177 lb (80.3 kg)   BMI 26.91 kg/m  GENERAL:  Well appearing elderly WM in NAD HEENT:  PERRL, EOMI, sclera are clear. Oropharynx is clear. NECK:  No jugular venous distention, carotid upstroke brisk and symmetric, no bruits, no thyromegaly or adenopathy LUNGS:  Clear to auscultation bilaterally CHEST:  Unremarkable HEART:  IRRR,  PMI not displaced or sustained,S1 and S2 within normal limits, no S3, no S4: no clicks, no rubs, no murmurs ABD:  Soft, nontender. BS +, no masses or bruits. No hepatomegaly, no splenomegaly EXT:  2 + pulses throughout, no edema, no cyanosis no clubbing SKIN:  Warm and dry.  No rashes NEURO:  Alert and oriented x 3. Cranial nerves II through XII intact. PSYCH:  Cognitively intact    LABORATORY DATA: Lab Results  Component Value Date   WBC 6.2 03/12/2017   HGB 13.4 03/12/2017   HCT 40.8 03/12/2017   PLT 157 03/12/2017   GLUCOSE 119 (H) 04/25/2016   ALT 75 (H) 04/17/2016   AST 133 (H) 04/17/2016   NA 133 (L) 04/25/2016   K 4.6 04/25/2016   CL 98 04/25/2016   CREATININE 1.43 (H) 04/25/2016   BUN 36 (H) 04/25/2016   CO2 26 04/25/2016   TSH 5.557 (H) 04/16/2016    Labs dated 03/12/16: cholesterol 72, triglycerides 81, HDL 17, LDL 39. A1c 5.3%. Dated 09/15/16: A1c 5.9% Dated 03/19/17: cholesterol 106, triglycerides 72, HDL 41, LDL 51. A1c 7.2%. Hgb 13.7. Creatinine 1.3.    Echo: 04/17/16:Study Conclusions  - Left ventricle: The cavity size was normal. Wall thickness was   increased in a pattern of moderate LVH. Systolic function was   normal. The estimated ejection fraction was in the range of 55%   to 60%. Wall motion was normal; there were no regional wall   motion abnormalities. - Mitral valve: Moderately calcified annulus. - Left atrium: The atrium was mildly  dilated. - Right ventricle: The cavity size was mildly dilated. - Right atrium: The atrium was severely dilated. - Pulmonary arteries: Systolic pressure was moderately increased.   PA peak pressure: 44 mm Hg (S). - Pericardium, extracardiac: There was a left pleural effusion   Carotid dopplers in September 2018 showed mild plaque.   Assessment / Plan: 1. Atrial fibrillation-permanent. HR is well controlled. We will continue Coreg at current dose. Continue Xarelto 15 mg daily adjusted for age and CKD.   2. Coronary disease. He had moderate three-vessel disease  by cardiac catheterization in 2009. He remains asymptomatic. We will continue medical management   3. Chronic diastolic CHF. New onset in October 2017 with pleural effusions. Clinically stable. Weight is stable   Continue current diuretic therapy.  Sodium restriction.   4. Hypercholesterolemia continue high-dose statin and fish oil. Primary care follows lab work  5. Diabetes mellitus type 2 on oral therapy. Followed by Dr. Wylene Simmer.   6. HTN controlled.   7. Fe deficiency anemia. Per hematology.  I will follow up in 6 months.

## 2017-09-21 ENCOUNTER — Encounter: Payer: Self-pay | Admitting: Cardiology

## 2017-09-21 ENCOUNTER — Ambulatory Visit: Payer: Medicare Other | Admitting: Cardiology

## 2017-09-21 VITALS — BP 130/70 | HR 84 | Ht 68.0 in | Wt 177.0 lb

## 2017-09-21 DIAGNOSIS — I2510 Atherosclerotic heart disease of native coronary artery without angina pectoris: Secondary | ICD-10-CM | POA: Diagnosis not present

## 2017-09-21 DIAGNOSIS — I482 Chronic atrial fibrillation: Secondary | ICD-10-CM

## 2017-09-21 DIAGNOSIS — E7800 Pure hypercholesterolemia, unspecified: Secondary | ICD-10-CM

## 2017-09-21 DIAGNOSIS — I5032 Chronic diastolic (congestive) heart failure: Secondary | ICD-10-CM | POA: Diagnosis not present

## 2017-09-21 DIAGNOSIS — I4821 Permanent atrial fibrillation: Secondary | ICD-10-CM

## 2017-09-21 NOTE — Patient Instructions (Signed)
Continue your current therapy  Keep up with your exercise

## 2017-09-22 IMAGING — CT CT ABD-PELV W/O CM
2 of 4 series · 10 of 46 positions shown, 11 images · non-contrast
Comparison: None.

CLINICAL DATA: Generalized abdominal pain.

EXAM:
CT ABDOMEN AND PELVIS WITHOUT CONTRAST
TECHNIQUE: Multidetector CT imaging of the abdomen and pelvis was performed
following the standard protocol without IV contrast.

[Series 201: routine, idose (2) · axial · 0.81mm/px · z∈[-166,+254]mm · 7 of 102 slices shown, 8 images]
[im 9/102  soft-tissue]
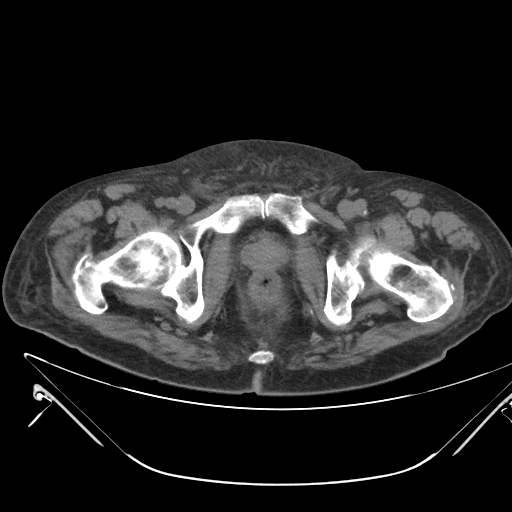
[im 9/102  bone]
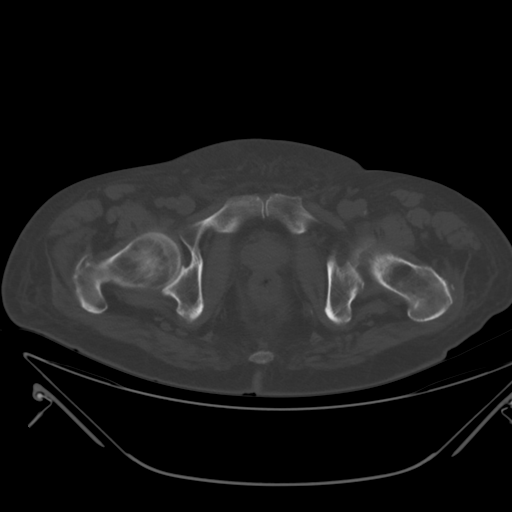
[im 22/102  soft-tissue]
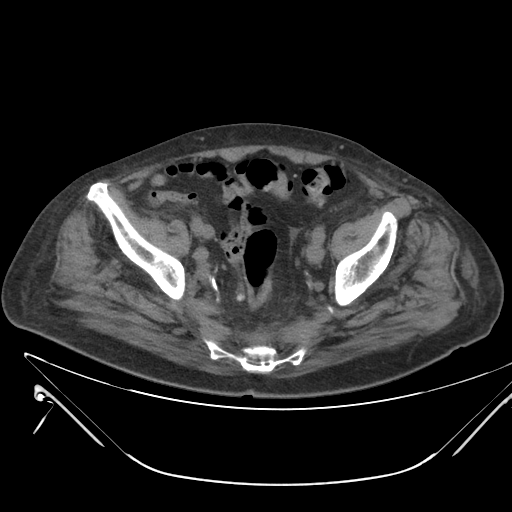
[im 38/102  soft-tissue]
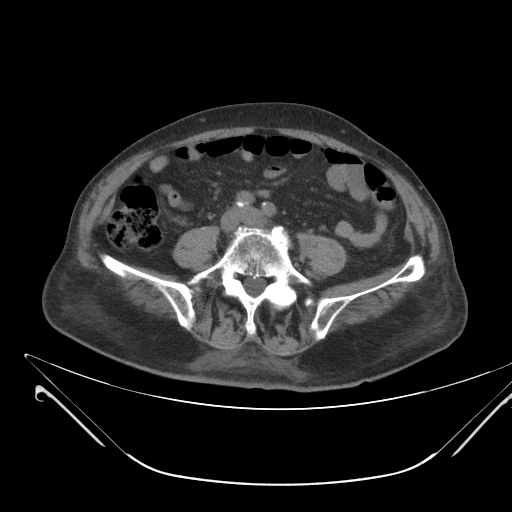
[im 51/102  soft-tissue]
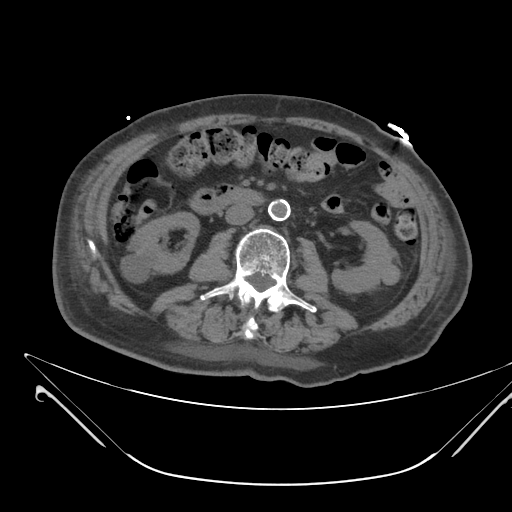
[im 64/102  soft-tissue]
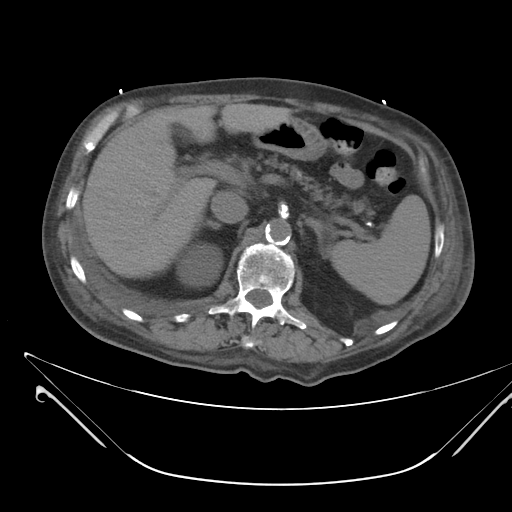
[im 80/102  soft-tissue]
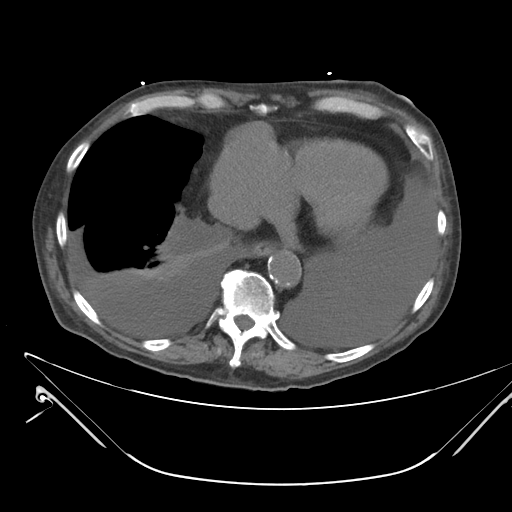
[im 93/102  soft-tissue]
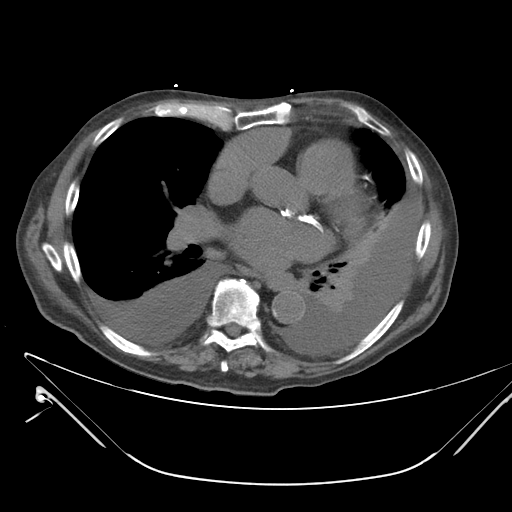

[Series 203: coronals, idose (2) · coronal · 0.45mm/px · 3 of 118 slices shown]
[im 40/118  soft-tissue]
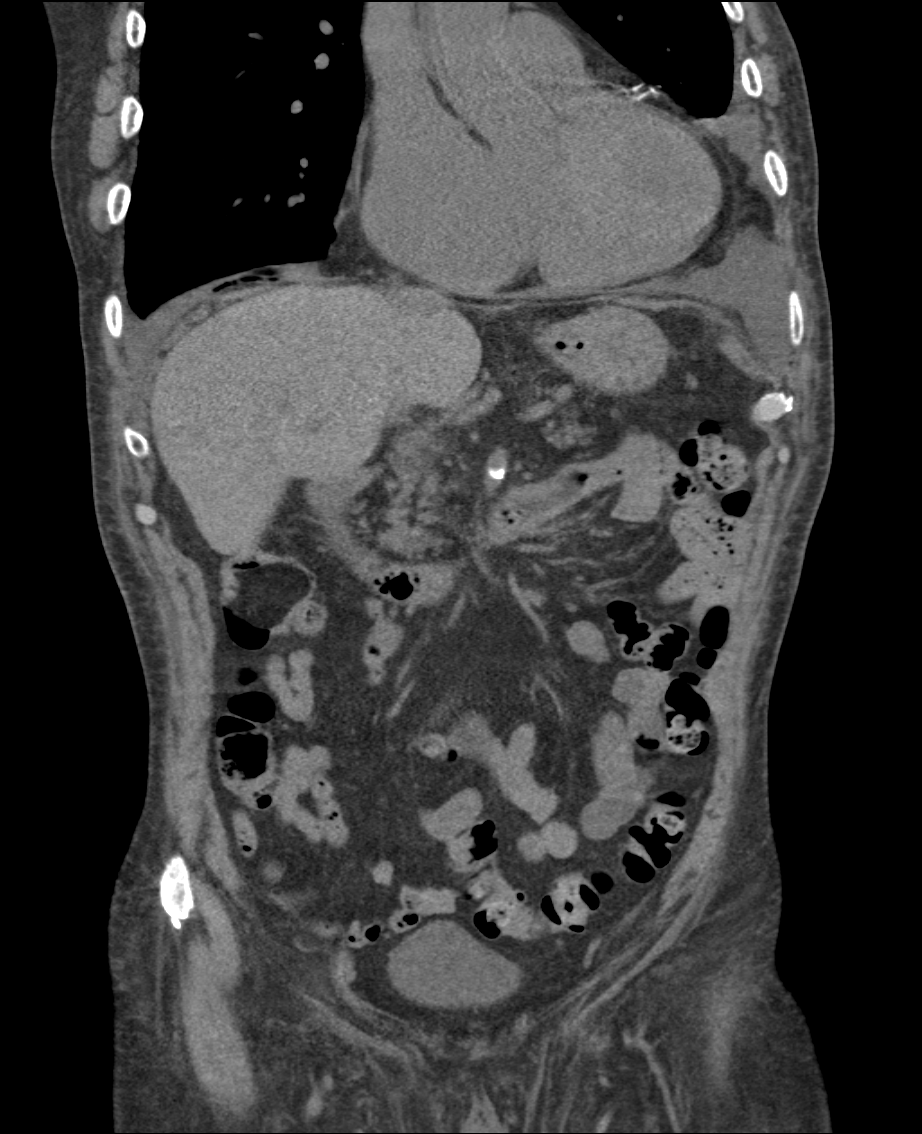
[im 53/118  soft-tissue]
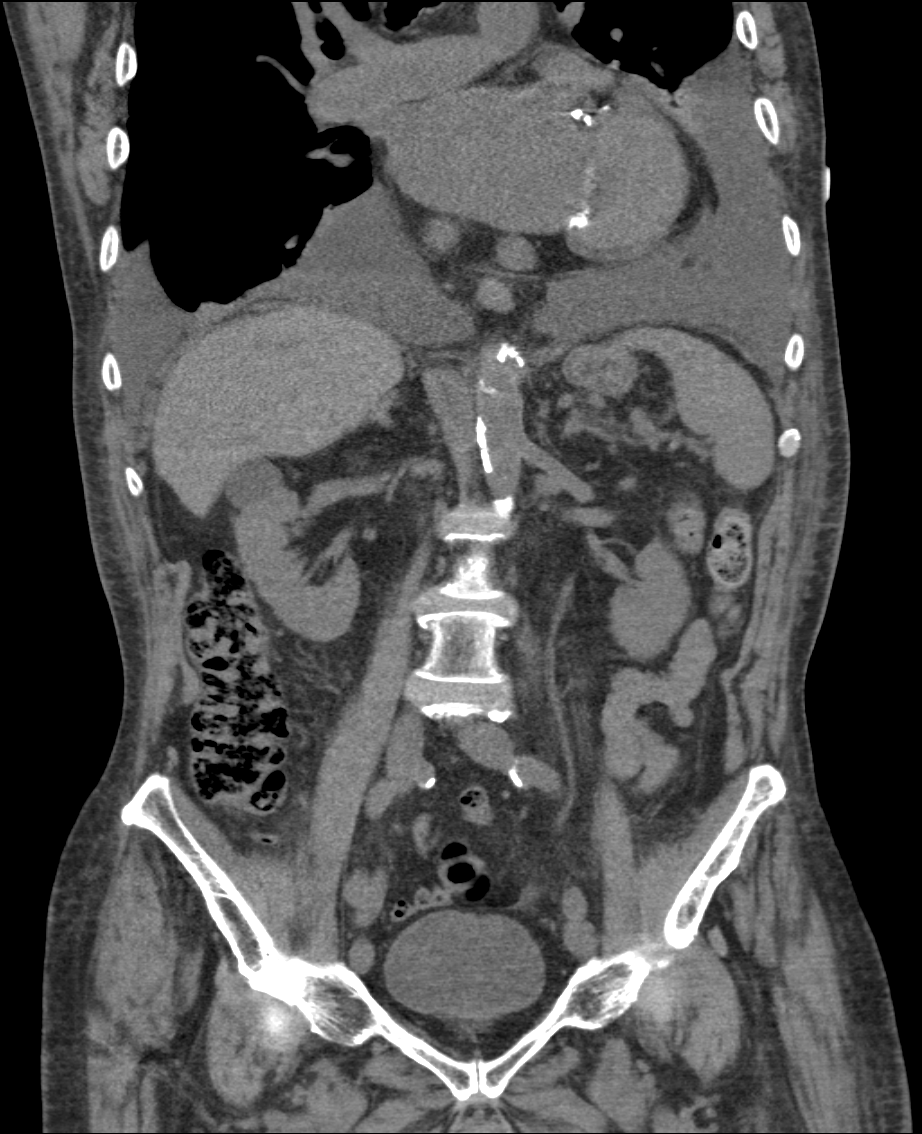
[im 66/118  soft-tissue]
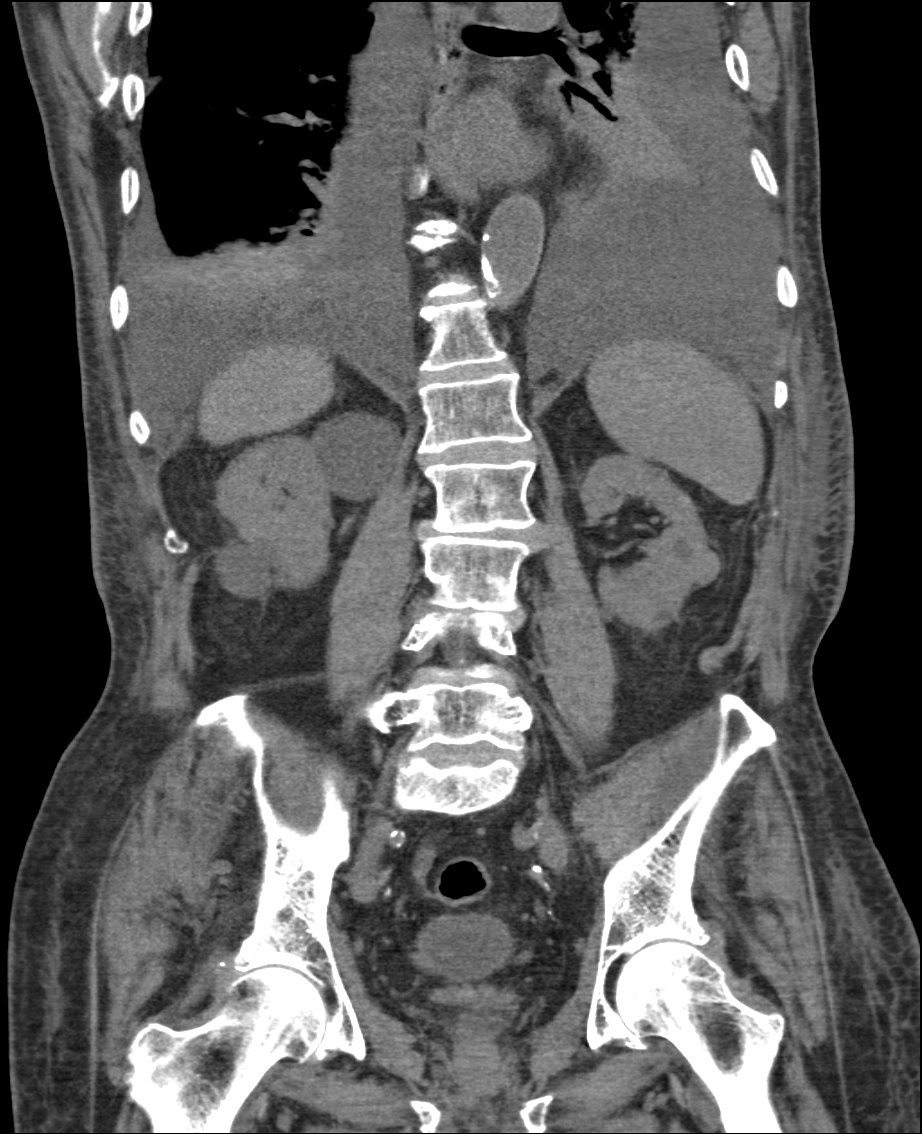

[10 of 46 positions shown; findings below may reference images not displayed]

FINDINGS: Lower chest: Moderate bilateral pleural effusions are noted with
left greater than right. Coronary artery calcifications are noted.

Hepatobiliary: Solitary gallstone is noted. No focal abnormality is
seen in the liver on these unenhanced images.

Pancreas: Normal.

Spleen: Normal.

Adrenals/Urinary Tract: Adrenal glands appear normal. Bilateral
renal cysts are noted. This includes probable 1.5 cm exophytic
hyperdense cyst arising from midpole of left kidney. No
hydronephrosis or renal obstruction is noted. No renal or ureteral
calculi are noted.

Stomach/Bowel: The appendix appears normal. There is no evidence of
bowel obstruction.

Vascular/Lymphatic: Atherosclerosis of abdominal aorta is noted
without aneurysm formation. No significant adenopathy is noted.

Reproductive: Normal prostate gland.

Other: No abnormal fluid collection is noted.

Musculoskeletal: Mild multilevel degenerative disc disease is noted
in the lumbar spine.
IMPRESSION: Moderate bilateral pleural effusions are noted, with left greater
than right.

Coronary artery calcifications are noted suggesting coronary artery
disease.

Solitary gallstone is noted.

Aortic atherosclerosis.

No acute abnormality is noted in the abdomen or pelvis.

## 2017-12-14 ENCOUNTER — Encounter: Payer: Self-pay | Admitting: Cardiology

## 2018-03-08 ENCOUNTER — Other Ambulatory Visit: Payer: Self-pay | Admitting: Cardiology

## 2018-03-14 NOTE — Assessment & Plan Note (Signed)
Normocytic anemia and thrombocytopenia: Referred by Dr.Tisovec Hemoglobin 9.6 with MCV 98.4 and platelets 138 or 03/20/2016 (WBC count and differential are normal) Hospitalization 04/15/2016 to10/20/2017 for severe fatigue (felt to be due to CHF and anemia): IV iron infusion 05/07/16 X 2 doses  Differential:Combined folic acid and iron deficiency anemia In the hospital stool Hemoccults were negative. Patient is currently on blood thinners so potentially this may be related to intermittent occult bleeding. Otherwise malabsorption as a possible etiology as well.  Treatment: Folic acid supplementation  Lab Review: Today's hemoglobin is 13.4. Iron studies are pending but I suspect they will be normal.  RTC in 12months with labs

## 2018-03-15 ENCOUNTER — Other Ambulatory Visit: Payer: Self-pay

## 2018-03-15 ENCOUNTER — Inpatient Hospital Stay: Payer: Medicare Other

## 2018-03-15 ENCOUNTER — Inpatient Hospital Stay: Payer: Medicare Other | Attending: Hematology and Oncology | Admitting: Hematology and Oncology

## 2018-03-15 DIAGNOSIS — D509 Iron deficiency anemia, unspecified: Secondary | ICD-10-CM | POA: Diagnosis not present

## 2018-03-15 DIAGNOSIS — E538 Deficiency of other specified B group vitamins: Secondary | ICD-10-CM | POA: Insufficient documentation

## 2018-03-15 LAB — CBC WITH DIFFERENTIAL (CANCER CENTER ONLY)
BASOS ABS: 0 10*3/uL (ref 0.0–0.1)
BASOS PCT: 0 %
EOS ABS: 0.1 10*3/uL (ref 0.0–0.5)
EOS PCT: 1 %
HCT: 38.3 % — ABNORMAL LOW (ref 38.4–49.9)
Hemoglobin: 12.5 g/dL — ABNORMAL LOW (ref 13.0–17.1)
LYMPHS ABS: 1.6 10*3/uL (ref 0.9–3.3)
Lymphocytes Relative: 18 %
MCH: 30.9 pg (ref 27.2–33.4)
MCHC: 32.6 g/dL (ref 32.0–36.0)
MCV: 94.6 fL (ref 79.3–98.0)
Monocytes Absolute: 0.7 10*3/uL (ref 0.1–0.9)
Monocytes Relative: 8 %
NEUTROS ABS: 6.5 10*3/uL (ref 1.5–6.5)
NEUTROS PCT: 73 %
PLATELETS: 137 10*3/uL — AB (ref 140–400)
RBC: 4.05 MIL/uL — ABNORMAL LOW (ref 4.20–5.82)
RDW: 14.4 % (ref 11.0–14.6)
WBC Count: 8.9 10*3/uL (ref 4.0–10.3)

## 2018-03-15 LAB — FOLATE: FOLATE: 53 ng/mL (ref >5.9)

## 2018-03-15 NOTE — Progress Notes (Signed)
Patient Care Team: Tisovec, Adelfa Kohichard W, MD as PCP - General (Internal Medicine)  DIAGNOSIS:  Encounter Diagnosis  Name Primary?  . Iron deficiency anemia, unspecified iron deficiency anemia type     CHIEF COMPLIANT: Follow-up of iron deficiency anemia and folic acid deficiency  INTERVAL HISTORY: Bruce Mann is a 84 year old gentleman with above-mentioned history of iron deficiency anemia and folic acid deficiency who is here for annual follow-up.  He has not had any problems with anemia in the last year.  He has not had any bleeding in his stools.  He has been taking folic acid supplement religiously.  REVIEW OF SYSTEMS:   Constitutional: Denies fevers, chills or abnormal weight loss Eyes: Denies blurriness of vision Ears, nose, mouth, throat, and face: Denies mucositis or sore throat Respiratory: Denies cough, dyspnea or wheezes Cardiovascular: Denies palpitation, chest discomfort Gastrointestinal:  Denies nausea, heartburn or change in bowel habits Skin: Denies abnormal skin rashes Lymphatics: Denies new lymphadenopathy or easy bruising Neurological:Denies numbness, tingling or new weaknesses Behavioral/Psych: Mood is stable, no new changes  Extremities: No lower extremity edema Breast:  denies any pain or lumps or nodules in either breasts All other systems were reviewed with the patient and are negative.  I have reviewed the past medical history, past surgical history, social history and family history with the patient and they are unchanged from previous note.  ALLERGIES:  has No Known Allergies.  MEDICATIONS:  Current Outpatient Medications  Medication Sig Dispense Refill  . Acetaminophen (TYLENOL 8 HOUR PO) Take 2 tablets by mouth as needed.    Marland Kitchen. atorvastatin (LIPITOR) 80 MG tablet Take 1 tablet (80 mg total) by mouth daily. 90 tablet 3  . Calcium Carbonate-Vitamin D (CALCIUM PLUS VITAMIN D PO) Take 1 tablet by mouth daily.     . carvedilol (COREG) 3.125 MG tablet  Take 1 tablet (3.125 mg total) by mouth 2 (two) times daily with a meal. 180 tablet 3  . Choline Fenofibrate (TRILIPIX) 135 MG capsule Take 1 capsule (135 mg total) by mouth daily. 90 capsule 3  . Docusate Calcium (STOOL SOFTENER PO) Take 1-3 Doses by mouth daily.    . folic acid (FOLVITE) 1 MG tablet TAKE ONE TABLET BY MOUTH ONCE DAILY 90 tablet 3  . furosemide (LASIX) 40 MG tablet TAKE 1 TABLET BY MOUTH ONCE DAILY 90 tablet 3  . loratadine (CLARITIN) 10 MG tablet Take 10 mg by mouth daily.    Marland Kitchen. losartan (COZAAR) 25 MG tablet Take 0.5 tablets (12.5 mg total) by mouth at bedtime. 45 tablet 3  . Multiple Vitamin (MULTIVITAMIN) tablet Take 1 tablet by mouth daily.    Marland Kitchen. NITROSTAT 0.4 MG SL tablet PLACE ONE TABLET UNDER THE TONGUE EVERY 5 MINUTES AS NEEDED FOR CHEST PAIN 25 tablet 11  . Omega-3 Fatty Acids (FISH OIL) 1000 MG CAPS Take 1 capsule (1,000 mg total) by mouth daily. 90 capsule 3  . potassium chloride SA (K-DUR,KLOR-CON) 20 MEQ tablet TAKE 1 TABLET BY MOUTH ONCE DAILY 90 tablet 3  . ranitidine (ZANTAC) 150 MG tablet Take 1 tablet (150 mg total) by mouth 2 (two) times daily. 180 tablet 3  . Rivaroxaban (XARELTO) 15 MG TABS tablet Take 1 tablet (15 mg total) by mouth daily with supper. 90 tablet 3   No current facility-administered medications for this visit.     PHYSICAL EXAMINATION: ECOG PERFORMANCE STATUS: 1 - Symptomatic but completely ambulatory  Vitals:   03/15/18 1523  BP: (!) 117/100  Pulse: 73  Resp: 18  Temp: 97.8 F (36.6 C)  SpO2: 100%   Filed Weights   03/15/18 1523  Weight: 167 lb 3.2 oz (75.8 kg)    GENERAL:alert, no distress and comfortable SKIN: skin color, texture, turgor are normal, no rashes or significant lesions EYES: normal, Conjunctiva are pink and non-injected, sclera clear OROPHARYNX:no exudate, no erythema and lips, buccal mucosa, and tongue normal  NECK: supple, thyroid normal size, non-tender, without nodularity LYMPH:  no palpable  lymphadenopathy in the cervical, axillary or inguinal LUNGS: clear to auscultation and percussion with normal breathing effort HEART: regular rate & rhythm and no murmurs and no lower extremity edema ABDOMEN:abdomen soft, non-tender and normal bowel sounds MUSCULOSKELETAL:no cyanosis of digits and no clubbing  NEURO: alert & oriented x 3 with fluent speech, no focal motor/sensory deficits EXTREMITIES: No lower extremity edema   LABORATORY DATA:  I have reviewed the data as listed CMP Latest Ref Rng & Units 04/25/2016 04/18/2016 04/17/2016  Glucose 65 - 99 mg/dL 161(W) 960(A) 540(J)  BUN 7 - 25 mg/dL 81(X) 91(Y) 78(G)  Creatinine 0.70 - 1.11 mg/dL 9.56(O) 1.30(Q) 6.57(Q)  Sodium 135 - 146 mmol/L 133(L) 137 137  Potassium 3.5 - 5.3 mmol/L 4.6 3.0(L) 3.6  Chloride 98 - 110 mmol/L 98 100(L) 102  CO2 20 - 31 mmol/L 26 27 26   Calcium 8.6 - 10.3 mg/dL 9.7 9.1 9.3  Total Protein 6.5 - 8.1 g/dL - - 5.6(L)  Total Bilirubin 0.3 - 1.2 mg/dL - - 1.3(H)  Alkaline Phos 38 - 126 U/L - - 34(L)  AST 15 - 41 U/L - - 133(H)  ALT 17 - 63 U/L - - 75(H)    Lab Results  Component Value Date   WBC 8.9 03/15/2018   HGB 12.5 (L) 03/15/2018   HCT 38.3 (L) 03/15/2018   MCV 94.6 03/15/2018   PLT 137 (L) 03/15/2018   NEUTROABS 6.5 03/15/2018    ASSESSMENT & PLAN:  Iron deficiency anemia Normocytic anemia and thrombocytopenia: Referred by Dr.Tisovec Hemoglobin 9.6 with MCV 98.4 and platelets 138 or 03/20/2016 (WBC count and differential are normal) Hospitalization 04/15/2016 to10/20/2017 for severe fatigue (felt to be due to CHF and anemia): IV iron infusion 05/07/16 X 2 doses  Differential:Combined folic acid and iron deficiency anemia In the hospital stool Hemoccults were negative. Patient is currently on blood thinners so potentially this may be related to intermittent occult bleeding. Otherwise malabsorption as a possible etiology as well.  Treatment: Folic acid supplementation  Lab Review:  Today's hemoglobin is 13.4. Iron studies are pending but I suspect they will be normal.  RTC in 12months with labs    No orders of the defined types were placed in this encounter.  The patient has a good understanding of the overall plan. he agrees with it. he will call with any problems that may develop before the next visit here.   Tamsen Meek, MD 03/15/18

## 2018-03-16 LAB — IRON AND TIBC
IRON: 75 ug/dL (ref 42–163)
Saturation Ratios: 19 % — ABNORMAL LOW (ref 42–163)
TIBC: 389 ug/dL (ref 202–409)
UIBC: 314 ug/dL

## 2018-03-16 LAB — FERRITIN: Ferritin: 92 ng/mL (ref 24–336)

## 2018-03-21 NOTE — Progress Notes (Signed)
Bruce Mann Date of Birth: 1930/10/25 Medical Record #[id number]  History of Present Illness: Bruce Mann is seen for followup CAD, diastolic CHF,  and atrial fibrillation.  He has a  history of coronary disease. He had angioplasty of the first obtuse marginal vessel may of 2001. In 2009 he had an abnormal stress test which led to heart catheterization. This demonstrated moderate three-vessel obstructive coronary disease. LV function was normal at that time. He was asymptomatic. We recommended medical management. He also has a history of atrial fibrillation. This is managed with rate control and anticoagulation.  He was admitted in October 2017 with acute diastolic CHF and left pleural effusion. Echo showed normal EF, biatrial enlargement, mild pulmonary HTN. He was diuresed with improvement. Discharged on Coreg, ARB, and diuretics. Initially had orthostatic hypotension. Meds adjusted. BNP up to 1160. Repeat CXR on 10/27 showed resolution of pulmonary edema and improved but persistent effusions. He was also anemic with iron deficiency and started on iron therapy.   On follow up today he is doing well.  He is seen with his wife. He denies any dyspnea or chest pain. No edema. He admits he hasn't been exercising as much and needs to get back into it. [facility] [employer] [facility] [employer] [facility]  Weight at home has been stable. No edema or angina. Hgb improved and released by hematology.   Current Outpatient Medications on File Prior to Visit  Medication Sig Dispense Refill  . Acetaminophen (TYLENOL 8 HOUR PO) Take 2 tablets by mouth as needed.    Marland Kitchen atorvastatin (LIPITOR) 80 MG tablet Take 1 tablet (80 mg total) by mouth daily. 90 tablet 3  . Calcium Carbonate-Vitamin D (CALCIUM PLUS VITAMIN D PO) Take 1 tablet by mouth daily.     . carvedilol (COREG) 3.125 MG tablet Take 1 tablet (3.125 mg total) by mouth 2 (two) times daily with a meal. 180 tablet 3  . Choline Fenofibrate (TRILIPIX) 135 MG capsule Take 1 capsule  (135 mg total) by mouth daily. 90 capsule 3  . Docusate Calcium (STOOL SOFTENER PO) Take 1-3 Doses by mouth daily.    . folic acid (FOLVITE) 1 MG tablet TAKE ONE TABLET BY MOUTH ONCE DAILY 90 tablet 3  . furosemide (LASIX) 40 MG tablet TAKE 1 TABLET BY MOUTH ONCE DAILY 90 tablet 3  . loratadine (CLARITIN) 10 MG tablet Take 10 mg by mouth daily.    Marland Kitchen losartan (COZAAR) 25 MG tablet Take 0.5 tablets (12.5 mg total) by mouth at bedtime. 45 tablet 3  . Multiple Vitamin (MULTIVITAMIN) tablet Take 1 tablet by mouth daily.    Marland Kitchen NITROSTAT 0.4 MG SL tablet PLACE ONE TABLET UNDER THE TONGUE EVERY 5 MINUTES AS NEEDED FOR CHEST PAIN 25 tablet 11  . Omega-3 Fatty Acids (OMEGA 3 500 PO) Take 1 capsule by mouth daily.    . potassium chloride SA (K-DUR,KLOR-CON) 20 MEQ tablet TAKE 1 TABLET BY MOUTH ONCE DAILY 90 tablet 3  . ranitidine (ZANTAC) 150 MG tablet Take 1 tablet (150 mg total) by mouth 2 (two) times daily. 180 tablet 3  . Rivaroxaban (XARELTO) 15 MG TABS tablet Take 1 tablet (15 mg total) by mouth daily with supper. 90 tablet 3   No current facility-administered medications on file prior to visit.     No Known Allergies  Past Medical History:  Diagnosis Date  . Anemia   . Atrial fibrillation (HCC)   . CAD (coronary artery disease)   . CHF (congestive heart failure) (  HCC) 03/2016   NEW ONSET  . Diabetes mellitus type II   . Dyslipidemia   . Hyperlipidemia   . Hypertension   . Weakness 03/2016    Past Surgical History:  Procedure Laterality Date  . CARDIAC CATHETERIZATION     Ejection Fraction 60%  . CATARACT EXTRACTION, BILATERAL    . COLONOSCOPY    . TONSILLECTOMY      Social History   Tobacco Use  Smoking Status Never Smoker  Smokeless Tobacco Never Used    Social History   Substance and Sexual Activity  Alcohol Use No    Family History  Problem Relation Age of Onset  . Stroke Mother   . Heart attack Father   . Heart attack Brother   . Colon cancer Neg Hx      Review of Systems: As noted in history of present illness.  All other systems were reviewed and are negative.  Physical Exam: BP 120/62   Pulse 67   Ht 5\' 7"  (1.702 m)   Wt 164 lb 12.8 oz (74.8 kg)   BMI 25.81 kg/m  GENERAL:  Well appearing elderly WM in NAD HEENT:  PERRL, EOMI, sclera are clear. Oropharynx is clear. NECK:  No jugular venous distention, carotid upstroke brisk and symmetric, no bruits, no thyromegaly or adenopathy LUNGS:  Clear to auscultation bilaterally CHEST:  Unremarkable HEART:  IRRR,  PMI not displaced or sustained,S1 and S2 within normal limits, no S3, no S4: no clicks, no rubs, no murmurs ABD:  Soft, nontender. BS +, no masses or bruits. No hepatomegaly, no splenomegaly EXT:  2 + pulses throughout, no edema, no cyanosis no clubbing SKIN:  Warm and dry.  No rashes NEURO:  Alert and oriented x 3. Cranial nerves II through XII intact. PSYCH:  Cognitively intact    LABORATORY DATA: Lab Results  Component Value Date   WBC 8.9 03/15/2018   HGB 12.5 (L) 03/15/2018   HCT 38.3 (L) 03/15/2018   PLT 137 (L) 03/15/2018   GLUCOSE 119 (H) 04/25/2016   ALT 75 (H) 04/17/2016   AST 133 (H) 04/17/2016   NA 133 (L) 04/25/2016   K 4.6 04/25/2016   CL 98 04/25/2016   CREATININE 1.43 (H) 04/25/2016   BUN 36 (H) 04/25/2016   CO2 26 04/25/2016   TSH 5.557 (H) 04/16/2016    Labs dated 03/12/16: cholesterol 72, triglycerides 81, HDL 17, LDL 39. A1c 5.3%. Dated 09/15/16: A1c 5.9% Dated 03/19/17: cholesterol 106, triglycerides 72, HDL 41, LDL 51. A1c 7.2%. Hgb 13.7. Creatinine 1.3.   Ecg today shows Afib with rate 67. Mild nonspecific ST abnormality.  Echo: 04/17/16:Study Conclusions  - Left ventricle: The cavity size was normal. Wall thickness was   increased in a pattern of moderate LVH. Systolic function was   normal. The estimated ejection fraction was in the range of 55%   to 60%. Wall motion was normal; there were no regional wall   motion  abnormalities. - Mitral valve: Moderately calcified annulus. - Left atrium: The atrium was mildly dilated. - Right ventricle: The cavity size was mildly dilated. - Right atrium: The atrium was severely dilated. - Pulmonary arteries: Systolic pressure was moderately increased.   PA peak pressure: 44 mm Hg (S). - Pericardium, extracardiac: There was a left pleural effusion   Carotid dopplers in September 2018 showed mild plaque.   Assessment / Plan: 1. Atrial fibrillation-permanent. HR is well controlled. We will continue Coreg at current dose. Continue Xarelto 15 mg daily adjusted  for age and CKD.   2. Coronary disease. He had moderate three-vessel disease by cardiac catheterization in 2009. He remains asymptomatic. We will continue medical management. Encourage increased aerobic activity.  3. Chronic diastolic CHF. New onset in October 2017 with pleural effusions.  Weight is stable   Appears euvolemic. Continue current diuretic therapy.  Sodium restriction.   4. Hypercholesterolemia continue high-dose statin and fish oil. He is due for lab work with primary care in 2 weeks.   5. Diabetes mellitus type 2 on oral therapy. Followed by Dr. Wylene Simmer.   6. HTN controlled.   7. Fe deficiency anemia. Improved.   I will follow up in 6 months.

## 2018-03-22 ENCOUNTER — Encounter: Payer: Self-pay | Admitting: Cardiology

## 2018-03-22 ENCOUNTER — Ambulatory Visit: Payer: Medicare Other | Admitting: Cardiology

## 2018-03-22 VITALS — BP 120/62 | HR 67 | Ht 67.0 in | Wt 164.8 lb

## 2018-03-22 DIAGNOSIS — I4821 Permanent atrial fibrillation: Secondary | ICD-10-CM

## 2018-03-22 DIAGNOSIS — I482 Chronic atrial fibrillation, unspecified: Secondary | ICD-10-CM

## 2018-03-22 DIAGNOSIS — E7800 Pure hypercholesterolemia, unspecified: Secondary | ICD-10-CM

## 2018-03-22 DIAGNOSIS — I5032 Chronic diastolic (congestive) heart failure: Secondary | ICD-10-CM

## 2018-03-22 MED ORDER — METFORMIN HCL 500 MG PO TABS: 500.0000 mg | ORAL_TABLET | Freq: Two times a day (BID) | ORAL | Status: AC

## 2018-03-22 NOTE — Patient Instructions (Addendum)
Continue your current therapy  Increase aerobic activity  I will see you in 6 months. 

## 2018-03-29 ENCOUNTER — Other Ambulatory Visit: Payer: Self-pay | Admitting: Cardiology

## 2018-04-27 ENCOUNTER — Other Ambulatory Visit: Payer: Self-pay | Admitting: Hematology and Oncology

## 2018-05-11 ENCOUNTER — Other Ambulatory Visit: Payer: Self-pay | Admitting: Cardiology

## 2018-05-11 DIAGNOSIS — I2510 Atherosclerotic heart disease of native coronary artery without angina pectoris: Secondary | ICD-10-CM

## 2018-08-03 ENCOUNTER — Other Ambulatory Visit: Payer: Self-pay | Admitting: Hematology and Oncology

## 2018-09-17 ENCOUNTER — Telehealth: Payer: Self-pay

## 2018-09-17 NOTE — Telephone Encounter (Signed)
   Primary Cardiologist:  Dr.Jordan   Patient contacted.  History reviewed.  No symptoms to suggest any unstable cardiac conditions.  Based on discussion, with current pandemic situation, we will be postponing this 09/21/18 appointment for [facility].  If symptoms change, he has been instructed to contact our office.   Rescheduled appointment to 11/17/18 at 3:20 pm.  Neoma Laming, LPN  [id number]/6203 5:59 PM         .

## 2018-09-21 ENCOUNTER — Ambulatory Visit: Payer: Medicare Other | Admitting: Cardiology

## 2018-10-29 ENCOUNTER — Other Ambulatory Visit: Payer: Self-pay | Admitting: Hematology and Oncology

## 2018-11-10 ENCOUNTER — Telehealth: Payer: Self-pay | Admitting: Cardiology

## 2018-11-10 NOTE — Telephone Encounter (Signed)
Error

## 2018-11-11 ENCOUNTER — Telehealth: Payer: Self-pay | Admitting: Physician Assistant

## 2018-11-11 NOTE — Telephone Encounter (Signed)
Mychart, smartphone, consent (verbal) pre reg complete 11/11/18 AF °

## 2018-11-12 ENCOUNTER — Telehealth: Payer: Self-pay

## 2018-11-12 ENCOUNTER — Telehealth (INDEPENDENT_AMBULATORY_CARE_PROVIDER_SITE_OTHER): Payer: Medicare Other | Admitting: Physician Assistant

## 2018-11-12 VITALS — BP 129/76 | HR 77 | Ht 68.0 in | Wt 160.0 lb

## 2018-11-12 DIAGNOSIS — I2510 Atherosclerotic heart disease of native coronary artery without angina pectoris: Secondary | ICD-10-CM | POA: Diagnosis not present

## 2018-11-12 DIAGNOSIS — I5032 Chronic diastolic (congestive) heart failure: Secondary | ICD-10-CM

## 2018-11-12 DIAGNOSIS — I4821 Permanent atrial fibrillation: Secondary | ICD-10-CM

## 2018-11-12 NOTE — Patient Instructions (Signed)
Medication Instructions:   Your physician recommends that you continue on your current medications as directed. Please refer to the Current Medication list given to you today.  If you need a refill on your cardiac medications before your next appointment, please call your pharmacy.   Lab work:  NONE ordered at this time of appointment   If you have labs (blood work) drawn today and your tests are completely normal, you will receive your results only by: . MyChart Message (if you have MyChart) OR . A paper copy in the mail If you have any lab test that is abnormal or we need to change your treatment, we will call you to review the results.  Testing/Procedures:  NONE ordered at this time of appointment   Follow-Up: At [facility], you and your health needs are our priority.  As part of our continuing mission to provide you with exceptional heart care, we have created designated Provider Care Teams.  These Care Teams include your primary Cardiologist (physician) and Advanced Practice Providers (APPs -  Physician Assistants and Nurse Practitioners) who all work together to provide you with the care you need, when you need it. You will need a follow up appointment in 6 months.  Please call our office 2 months in advance to schedule this appointment.  You may see Peter Jordan, MD or one of the following Advanced Practice Providers on your designated Care Team: Hao Meng, PA-C . Angela Duke, PA-C  Any Other Special Instructions Will Be Listed Below (If Applicable).    

## 2018-11-12 NOTE — Telephone Encounter (Signed)
Virtual Visit Pre-Appointment Phone Call  "(Name), I am calling you today to discuss your upcoming appointment. We are currently trying to limit exposure to the virus that causes COVID-19 by seeing patients at home rather than in the office."  1. "What is the BEST phone number to call the day of the visit?" - include this in appointment notes  2. "Do you have or have access to (through a family member/friend) a smartphone with video capability that we can use for your visit?" a. If yes - list this number in appt notes as "cell" (if different from BEST phone #) and list the appointment type as a VIDEO visit in appointment notes b. If no - list the appointment type as a PHONE visit in appointment notes  3. Confirm consent - "In the setting of the current Covid19 crisis, you are scheduled for a PHONE visit with your provider on 11/12/2018 at 2:30PM.  Just as we do with many in-office visits, in order for you to participate in this visit, we must obtain consent.  If you'd like, I can send this to your mychart (if signed up) or email for you to review.  Otherwise, I can obtain your verbal consent now.  All virtual visits are billed to your insurance company just like a normal visit would be.  By agreeing to a virtual visit, we'd like you to understand that the technology does not allow for your provider to perform an examination, and thus may limit your provider's ability to fully assess your condition. If your provider identifies any concerns that need to be evaluated in person, we will make arrangements to do so.  Finally, though the technology is pretty good, we cannot assure that it will always work on either your or our end, and in the setting of a video visit, we may have to convert it to a phone-only visit.  In either situation, we cannot ensure that we have a secure connection.  Are you willing to proceed?" STAFF: Did the patient verbally acknowledge consent to telehealth visit? Document YES/NO  here: YES  4. Advise patient to be prepared - "Two hours prior to your appointment, go ahead and check your blood pressure, pulse, oxygen saturation, and your weight (if you have the equipment to check those) and write them all down. When your visit starts, your provider will ask you for this information. If you have an Apple Watch or Kardia device, please plan to have heart rate information ready on the day of your appointment. Please have a pen and paper handy nearby the day of the visit as well."  5. Give patient instructions for MyChart download to smartphone OR Doximity/Doxy.me as below if video visit (depending on what platform provider is using)  6. Inform patient they will receive a phone call 15 minutes prior to their appointment time (may be from unknown caller ID) so they should be prepared to answer    TELEPHONE CALL NOTE  Bruce Mann has been deemed a candidate for a follow-up tele-health visit to limit community exposure during the Covid-19 pandemic. I spoke with the patient via phone to ensure availability of phone/video source, confirm preferred email & phone number, and discuss instructions and expectations.  I reminded Bruce Mann to be prepared with any vital sign and/or heart rhythm information that could potentially be obtained via home monitoring, at the time of his visit. I reminded Bruce Mann to expect a phone call prior to his visit.  Dorris Fetcherrah Nykia Turko, CMA 11/12/2018 2:41 PM   INSTRUCTIONS FOR DOWNLOADING THE MYCHART APP TO SMARTPHONE  - The patient must first make sure to have activated MyChart and know their login information - If Apple, go to [employer] and type in MyChart in the search bar and download the app. If Android, ask patient to go to [employer] Play Store and type in [facility] in the search bar and download the app. The app is free but as with any other app downloads, their phone may require them to verify saved payment information or Apple/Android  password.  - The patient will need to then log into the app with their MyChart username and password, and select [facility] as their healthcare provider to link the account. When it is time for your visit, go to the MyChart app, find appointments, and click Begin Video Visit. Be sure to Select Allow for your device to access the Microphone and Camera for your visit. You will then be connected, and your provider will be with you shortly.  **If they have any issues connecting, or need assistance please contact MyChart service desk [phone])**  **If using a computer, in order to ensure the best quality for their visit they will need to use either of the following Internet Browsers: [employer] Edge, or Google Chrome**  IF USING DOXIMITY or DOXY.ME - The patient will receive a link just prior to their visit by text.     FULL LENGTH CONSENT FOR TELE-HEALTH VISIT   I hereby voluntarily request, consent and authorize [facility] and its employed or contracted physicians, physician assistants, nurse practitioners or other licensed health care professionals (the Practitioner), to provide me with telemedicine health care services (the "Services") as deemed necessary by the treating Practitioner. I acknowledge and consent to receive the Services by the Practitioner via telemedicine. I understand that the telemedicine visit will involve communicating with the Practitioner through live audiovisual communication technology and the disclosure of certain medical information by electronic transmission. I acknowledge that I have been given the opportunity to request an in-person assessment or other available alternative prior to the telemedicine visit and am voluntarily participating in the telemedicine visit.  I understand that I have the right to withhold or withdraw my consent to the use of telemedicine in the course of my care at any time, without affecting my right to future care or treatment,  and that the Practitioner or I may terminate the telemedicine visit at any time. I understand that I have the right to inspect all information obtained and/or recorded in the course of the telemedicine visit and may receive copies of available information for a reasonable fee.  I understand that some of the potential risks of receiving the Services via telemedicine include:  Marland Kitchen. Delay or interruption in medical evaluation due to technological equipment failure or disruption; . Information transmitted may not be sufficient (e.g. poor resolution of images) to allow for appropriate medical decision making by the Practitioner; and/or  . In rare instances, security protocols could fail, causing a breach of personal health information.  Furthermore, I acknowledge that it is my responsibility to provide information about my medical history, conditions and care that is complete and accurate to the best of my ability. I acknowledge that Practitioner's advice, recommendations, and/or decision may be based on factors not within their control, such as incomplete or inaccurate data provided by me or distortions of diagnostic images or specimens that may result from electronic transmissions. I understand that the  practice of medicine is not an Chief Strategy Officer and that Practitioner makes no warranties or guarantees regarding treatment outcomes. I acknowledge that I will receive a copy of this consent concurrently upon execution via email to the email address I last provided but may also request a printed copy by calling the office of [facility].    I understand that my insurance will be billed for this visit.   I have read or had this consent read to me. . I understand the contents of this consent, which adequately explains the benefits and risks of the Services being provided via telemedicine.  . I have been provided ample opportunity to ask questions regarding this consent and the Services and have had my questions  answered to my satisfaction. . I give my informed consent for the services to be provided through the use of telemedicine in my medical care  By participating in this telemedicine visit I agree to the above.

## 2018-11-12 NOTE — Progress Notes (Signed)
Virtual Visit via Telephone Note   This visit type was conducted due to national recommendations for restrictions regarding the COVID-19 Pandemic (e.g. social distancing) in an effort to limit this patient's exposure and mitigate transmission in our community.  Due to his co-morbid illnesses, this patient is at least at moderate risk for complications without adequate follow up.  This format is felt to be most appropriate for this patient at this time.  The patient did not have access to video technology/had technical difficulties with video requiring transitioning to audio format only (telephone).  All issues noted in this document were discussed and addressed.  No physical exam could be performed with this format.  Please refer to the patient's chart for his  consent to telehealth for [facility].   Date:  11/12/2018   ID:  Bruce Mann, DOB Feb 06, 1931, MRN [id number]  Patient Location: Home Provider Location: Home  PCP:  Wylene Simmerisovec, Adelfa Kohichard W, MD  Cardiologist:  Peter Swazil[place]Jordan, MD  Electrophysiologist:  None   Evaluation Performed:  Follow-Up Visit  Chief Complaint:  followup  History of Present Illness:    Bruce Mann is a 85 y.o. male with past medical history of CAD, diastolic heart failure, and permanent atrial fibrillation on low-dose Xarelto adjusted for age and CKD. He had a angioplasty of OM1 in 2001.  He had abnormal stress test in 2009 that led to a another cardiac catheterization.  This demonstrated three-vessel disease with normal EF.  He was medically managed.  He was admitted in October 2017 with acute diastolic heart failure and left pleural effusion.  Echocardiogram at the time showed normal EF, biatrial enlargement, mild pulmonary hypertension.  He was diuresed and the discharged on carvedilol, ARB and diuretic.  This initially led to orthostatic hypotension, medication was later adjusted with improvement.  He was last seen by Dr. SwazilandJordan on 03/22/2018 at which time he was  doing well.  Patient was contacted a via telephone visit along with his wife.  They do not do much strenuous activity.  He still goes to the post office almost daily.  He denies any recent exertional chest pain or shortness of breath.  He has no lower extremity edema, orthopnea or PND.  Overall, he is doing quite well from cardiology perspective.  Last CBC was obtained in September 2019 which showed his anemia was quite stable.  Dr. Pamelia HoitGudena has released him back to his primary care doctor for further observation.  He can follow-up in 6 months.  The patient does not have symptoms concerning for COVID-19 infection (fever, chills, cough, or new shortness of breath).    Past Medical History:  Diagnosis Date  . Anemia   . Atrial fibrillation (HCC)   . CAD (coronary artery disease)   . CHF (congestive heart failure) (HCC) 03/2016   [place]  . Diabetes mellitus type II   . Dyslipidemia   . Hyperlipidemia   . Hypertension   . Weakness 03/2016   Past Surgical History:  Procedure Laterality Date  . CARDIAC CATHETERIZATION     Ejection Fraction 60%  . CATARACT EXTRACTION, BILATERAL    . COLONOSCOPY    . TONSILLECTOMY       Current Meds  Medication Sig  . Acetaminophen (TYLENOL 8 HOUR PO) Take 2 tablets by mouth as needed.  Marland Kitchen. atorvastatin (LIPITOR) 80 MG tablet Take 1 tablet (80 mg total) by mouth daily.  . Calcium Carbonate-Vitamin D (CALCIUM PLUS VITAMIN D PO) Take 1 tablet by mouth  daily.   . carvedilol (COREG) 3.125 MG tablet Take 1 tablet (3.125 mg total) by mouth 2 (two) times daily with a meal.  . Choline Fenofibrate (FENOFIBRIC ACID) 135 MG CPDR TAKE 1 CAPSULE BY MOUTH ONCE DAILY  . Docusate Calcium (STOOL SOFTENER PO) Take 1-3 Doses by mouth daily.  . folic acid (FOLVITE) 1 MG tablet Take 1 tablet by mouth once daily  . furosemide (LASIX) 40 MG tablet TAKE 1 TABLET BY MOUTH ONCE DAILY  . loratadine (CLARITIN) 10 MG tablet Take 10 mg by mouth daily.  Marland Kitchen losartan (COZAAR) 25 MG  tablet TAKE 1/2 (ONE-HALF) TABLET BY MOUTH ONCE DAILY AT BEDTIME  . metFORMIN (GLUCOPHAGE) 500 MG tablet Take 1 tablet (500 mg total) by mouth 2 (two) times daily with a meal.  . Multiple Vitamin (MULTIVITAMIN) tablet Take 1 tablet by mouth daily.  Marland Kitchen NITROSTAT 0.4 MG SL tablet DISSOLVE ONE TABLET UNDER THE TONGUE EVERY 5 MINUTES AS NEEDED FOR CHEST PAIN.  DO NOT EXCEED A TOTAL OF 3 DOSES IN 15 MINUTES  . Omega-3 Fatty Acids (OMEGA 3 500 PO) Take 1 capsule by mouth daily.  . potassium chloride SA (K-DUR,KLOR-CON) 20 MEQ tablet TAKE 1 TABLET BY MOUTH ONCE DAILY  . ranitidine (ZANTAC) 150 MG tablet Take 1 tablet (150 mg total) by mouth 2 (two) times daily.  . Rivaroxaban (XARELTO) 15 MG TABS tablet Take 1 tablet (15 mg total) by mouth daily with supper.     Allergies:   Patient has no known allergies.   Social History   Tobacco Use  . Smoking status: Never Smoker  . Smokeless tobacco: Never Used  Substance Use Topics  . Alcohol use: No  . Drug use: No     Family Hx: The patient's family history includes Heart attack in his brother and father; Stroke in his mother. There is no history of Colon cancer.  ROS:   Please see the history of present illness.     All other systems reviewed and are negative.   Prior CV studies:   The following studies were reviewed today:  Echo 04/17/2016 LV EF: 55% -   60%  ------------------------------------------------------------------- Indications:      CHF - 428.0.  ------------------------------------------------------------------- History:   PMH:   Atrial fibrillation.  Coronary artery disease. Risk factors:  Hypertension. Diabetes mellitus. Dyslipidemia.  ------------------------------------------------------------------- Study Conclusions  - Left ventricle: The cavity size was normal. Wall thickness was   increased in a pattern of moderate LVH. Systolic function was   normal. The estimated ejection fraction was in the range of 55%    to 60%. Wall motion was normal; there were no regional wall   motion abnormalities. - Mitral valve: Moderately calcified annulus. - Left atrium: The atrium was mildly dilated. - Right ventricle: The cavity size was mildly dilated. - Right atrium: The atrium was severely dilated. - Pulmonary arteries: Systolic pressure was moderately increased.   PA peak pressure: 44 mm Hg (S). - Pericardium, extracardiac: There was a left pleural effusion  Labs/Other Tests and Data Reviewed:    EKG:  An ECG dated 03/22/2018 was personally reviewed today and demonstrated:  Atrial fibrillation  Recent Labs: 03/15/2018: Hemoglobin 12.5; Platelet Count 137   Recent Lipid Panel No results found for: CHOL, TRIG, HDL, CHOLHDL, LDLCALC, LDLDIRECT  Wt Readings from Last 3 Encounters:  11/12/18 160 lb (72.6 kg)  03/22/18 164 lb 12.8 oz (74.8 kg)  03/15/18 167 lb 3.2 oz (75.8 kg)     Objective:  Vital Signs:  BP 129/76   Pulse 77   Ht 5\' 8"  (1.727 m)   Wt 160 lb (72.6 kg)   BMI 24.33 kg/m    VITAL SIGNS:  reviewed  ASSESSMENT & PLAN:    1. CAD: Patient denies any recent exertional symptoms.  Not on aspirin given the need for Xarelto  2. Permanent atrial fibrillation: On low-dose Xarelto due to renal dysfunction.  Rate controlled  3. Chronic diastolic heart failure: Denies any recent lower extremity edema, orthopnea or PND.  COVID-19 Education: The signs and symptoms of COVID-19 were discussed with the patient and how to seek care for testing (follow up with PCP or arrange E-visit).  The importance of social distancing was discussed today.  Time:   Today, I have spent 8 minutes with the patient with telehealth technology discussing the above problems.     Medication Adjustments/Labs and Tests Ordered: Current medicines are reviewed at length with the patient today.  Concerns regarding medicines are outlined above.   Tests Ordered: No orders of the defined types were placed in this  encounter.   Medication Changes: No orders of the defined types were placed in this encounter.   Disposition:  Follow up in 6 month(s)  Signed, Azalee Course, [place]  11/12/2018 2:43 PM    [facility]

## 2018-11-17 ENCOUNTER — Ambulatory Visit: Payer: Medicare Other | Admitting: Cardiology

## 2019-02-04 ENCOUNTER — Other Ambulatory Visit: Payer: Self-pay | Admitting: Hematology and Oncology

## 2019-03-10 ENCOUNTER — Other Ambulatory Visit: Payer: Self-pay | Admitting: Cardiology

## 2019-04-07 ENCOUNTER — Other Ambulatory Visit: Payer: Self-pay | Admitting: Cardiology

## 2019-04-14 ENCOUNTER — Other Ambulatory Visit: Payer: Self-pay | Admitting: Hematology and Oncology

## 2019-04-21 ENCOUNTER — Other Ambulatory Visit: Payer: Self-pay | Admitting: Cardiology

## 2019-05-13 ENCOUNTER — Other Ambulatory Visit: Payer: Self-pay | Admitting: Cardiology

## 2019-05-20 ENCOUNTER — Other Ambulatory Visit: Payer: Self-pay | Admitting: Cardiology

## 2019-05-28 ENCOUNTER — Other Ambulatory Visit: Payer: Self-pay | Admitting: Cardiology

## 2019-06-01 ENCOUNTER — Other Ambulatory Visit: Payer: Self-pay

## 2019-06-01 MED ORDER — FENOFIBRIC ACID 135 MG PO CPDR: DELAYED_RELEASE_CAPSULE | ORAL | 1 refills | Status: DC

## 2019-06-16 ENCOUNTER — Other Ambulatory Visit: Payer: Self-pay | Admitting: Cardiology

## 2019-06-16 ENCOUNTER — Other Ambulatory Visit: Payer: Self-pay | Admitting: Hematology and Oncology

## 2019-06-19 NOTE — Progress Notes (Deleted)
Cardiology Office Note:    Date:  06/19/2019   ID:  Bruce Mann, DOB 05-Oct-1930, MRN [id number]  PCP:  Gaspar Garbe, MD  Cardiologist:  Peter Swaziland, MD  Electrophysiologist:  None   Referring MD: Gaspar Garbe, MD   Chief Complaint: ***  History of Present Illness:    Bruce Mann is a 85 y.o. male with a history of CAD s/p angioplasty of OM1 in 2001 and cardiac catheterization in 2009 that showed 3-vessel disease treated medically, chronic diastolic CHF, permanent atrial fibrillation on low dose Xarelto,  hypertensin, hyperlipidemia, type 2 diabetes mellitus, and anemia who is followed by Dr. Swaziland.  Patient has a history of CAD s/p remote angioplasty to OM1 in 2001. He had an abnormal stress test in 2009 and then underwent cardiac catheterization that showed 3-vessel CAD with 70% stenosis of proximal LAD followed by 40-50% stenosis in the mid LAD, 80-90% stenosis of the proximal RCA followed by 70-80% stenosis of the mid RCA, 80% ostial stenosis of a ramus intermediate branch, and 40% stenosis of the proximal LCX. LV function normal. Decision was made to treat medically. Patient was admitted in 03/2016 with acute diastolic CHF and left pleural effusion. Echo at that time showed LVEF of 55-60% with normal wall motion, biatrial enlargement, moderately elevated PASP of 44 mmHg. He was diuresed and then discharged on Coreg, ARB, and PO Lasix. This initially led to orthostatic hypotension and medications were adjusted with improvement of symptoms.   Patient was last seen by Azalee Course, PA-C, for a virtual visit in 10/2018 at which time patient admitted to not do much strenuous activity but denies any cardiac symptoms. No medication changes were made and patient was advised to follow-up in 6 months.   Patient presents today for follow-up. ***  CAD without Angina - History of angioplasty to OM1 in 2001. Cardiac catheterization in 2009 showed 3-vessel disease but disease was made to  treat medically. - Stable. No angina. *** - Continue current medical therapy: Coreg 3.125mg  twice daily and Lipitor 80mg  daily. Not on Aspirin due to need for Xarelto.   Chronic Diastolic CHF - Most recent Echo from 03/2016 showed LVEF of 55-60% with normal wall motion, biatrial enlargement, moderately elevated PASP of 44 mmHg. - *** - Continue Lasix 20mg  daily. - Continue beta-blocker and ARB.  Permanent Atrial Fibrillation - *** - Continue Coreg 3.125mg  twice daily. - Continue chronic anticoagulation with Xarelto 15mg  daily.  Hypertension - *** - Continue Coreg 3.125mg  twice daily and Losartan 12.5mg  daily.  Hyperlipidemia - ***. - Continue Lipitor 80mg  daily.  Type 2 Diabetes Mellitus - Hemoglobin A1c 6.4 in 03/2019. - On Metformin at home. - Managed by PCP.  Past Medical History:  Diagnosis Date  . Anemia   . Atrial fibrillation (HCC)   . CAD (coronary artery disease)   . CHF (congestive heart failure) (HCC) 03/2016   NEW ONSET  . Diabetes mellitus type II   . Dyslipidemia   . Hyperlipidemia   . Hypertension   . Weakness 03/2016    Past Surgical History:  Procedure Laterality Date  . CARDIAC CATHETERIZATION     Ejection Fraction 60%  . CATARACT EXTRACTION, BILATERAL    . COLONOSCOPY    . TONSILLECTOMY      Current Medications: No outpatient medications have been marked as taking for the 06/21/19 encounter (Appointment) with 03/2019, PA.     Allergies:   Patient has no known allergies.   Social History  Socioeconomic History  . Marital status: Married    Spouse name: Harriett Sineancy  . Number of children: 4  . Years of education: Not on file  . Highest education level: Not on file  Occupational History  . Occupation: Naval architectreal estate investor  Tobacco Use  . Smoking status: Never Smoker  . Smokeless tobacco: Never Used  Substance and Sexual Activity  . Alcohol use: No  . Drug use: No  . Sexual activity: Not on file  Other Topics Concern  . Not on file    Social History Narrative  . Not on file   Social Determinants of Health   Financial Resource Strain:   . Difficulty of Paying Living Expenses: Not on file  Food Insecurity:   . Worried About Programme researcher, broadcasting/film/videounning Out of Food in the Last Year: Not on file  . Ran Out of Food in the Last Year: Not on file  Transportation Needs:   . Lack of Transportation (Medical): Not on file  . Lack of Transportation (Non-Medical): Not on file  Physical Activity:   . Days of Exercise per Week: Not on file  . Minutes of Exercise per Session: Not on file  Stress:   . Feeling of Stress : Not on file  Social Connections:   . Frequency of Communication with Friends and Family: Not on file  . Frequency of Social Gatherings with Friends and Family: Not on file  . Attends Religious Services: Not on file  . Active Member of Clubs or Organizations: Not on file  . Attends BankerClub or Organization Meetings: Not on file  . Marital Status: Not on file     Family History: The patient's ***family history includes Heart attack in his brother and father; Stroke in his mother. There is no history of Colon cancer.  ROS:   Please see the history of present illness.    *** All other systems reviewed and are negative.  EKGs/Labs/Other Studies Reviewed:    The following studies were reviewed today:  Left Heart Catheterization 05/09/2008: Angiographic Data: Left coronary artery arises normally.  It is heavily calcified in the left main coronary, the proximal and mid LAD, and the proximal and mid circumflex.  The left main coronary is without significant disease.  The left anterior descending artery has a bulky segmental 70% stenosis proximally.  There is a 40-50% stenosis in the midvessel.  There is a ramus intermediate branch which has an 80% ostial stenosis.  The proximal left circumflex coronary has diffuse 40% disease.  The first obtuse marginal vessel is widely patent.  The distal circumflex is without significant  disease.  The right coronary artery is a codominant vessel.  It is moderately calcified in the proximal and mid segments.  There is an 80-90% proximal  stenosis followed by a 70-80% focal stenosis in the midvessel.  Left ventricular angiography was performed in the RAO view.  This demonstrates normal left ventricular size and contractility with normal systolic function.  Ejection fraction is estimated at 60%.  There is no mitral regurgitation or prolapse.  Final Interpretation: 1. Three-vessel obstructive atherosclerotic coronary artery disease.      The patient has moderately severe disease in      the proximal left anterior descending.  He has severe disease in      the ostial ramus branch and severe disease in the proximal and mid      right coronary artery. 2. Normal left ventricular function. _______________  Echocardiogram 04/17/2016: Study Conclusions: - Left ventricle: The  cavity size was normal. Wall thickness was   increased in a pattern of moderate LVH. Systolic function was   normal. The estimated ejection fraction was in the range of 55%   to 60%. Wall motion was normal; there were no regional wall   motion abnormalities. - Mitral valve: Moderately calcified annulus. - Left atrium: The atrium was mildly dilated. - Right ventricle: The cavity size was mildly dilated. - Right atrium: The atrium was severely dilated. - Pulmonary arteries: Systolic pressure was moderately increased.   PA peak pressure: 44 mm Hg (S). - Pericardium, extracardiac: There was a left pleural effusion.  EKG:  EKG ordered today. EKG personally reviewed and demonstrates ***.  Recent Labs: No results found for requested labs within last 8760 hours.  Recent Lipid Panel No results found for: CHOL, TRIG, HDL, CHOLHDL, VLDL, LDLCALC, LDLDIRECT  Physical Exam:    Vital Signs: There were no vitals taken for this visit.    Wt Readings from Last 3 Encounters:  11/12/18 160 lb (72.6 kg)    03/22/18 164 lb 12.8 oz (74.8 kg)  03/15/18 167 lb 3.2 oz (75.8 kg)     General: 85 y.o. male in no acute distress. HEENT: Normocephalic and atraumatic. Sclera clear. EOMs intact. Neck: Supple. No carotid bruits. No JVD. Heart: *** RRR. Distinct S1 and S2. No murmurs, gallops, or rubs. Radial and distal pedal pulses 2+ and equal bilaterally. Lungs: No increased work of breathing. Clear to ausculation bilaterally. No wheezes, rhonchi, or rales.  Abdomen: Soft, non-distended, and non-tender to palpation. Bowel sounds present in all 4 quadrants.  MSK: Normal strength and tone for age. *** Extremities: No lower extremity edema.    Skin: Warm and dry. Neuro: Alert and oriented x3. No focal deficits. Psych: Normal affect. Responds appropriately.   Assessment:    No diagnosis found.  Plan:     Disposition: Follow up in ***   Medication Adjustments/Labs and Tests Ordered: Current medicines are reviewed at length with the patient today.  Concerns regarding medicines are outlined above.  No orders of the defined types were placed in this encounter.  No orders of the defined types were placed in this encounter.   There are no Patient Instructions on file for this visit.   Signed, Darreld Mclean, PA-C  06/19/2019 2:05 PM    [place]

## 2019-06-21 ENCOUNTER — Ambulatory Visit: Payer: Medicare Other | Admitting: Physician Assistant

## 2019-06-29 ENCOUNTER — Other Ambulatory Visit: Payer: Self-pay

## 2019-06-29 ENCOUNTER — Encounter: Payer: Self-pay | Admitting: Physician Assistant

## 2019-06-29 ENCOUNTER — Ambulatory Visit: Payer: Medicare Other | Admitting: Physician Assistant

## 2019-06-29 VITALS — BP 153/75 | HR 81 | Temp 97.5°F | Ht 68.0 in | Wt 159.8 lb

## 2019-06-29 DIAGNOSIS — I4821 Permanent atrial fibrillation: Secondary | ICD-10-CM | POA: Diagnosis not present

## 2019-06-29 DIAGNOSIS — I5032 Chronic diastolic (congestive) heart failure: Secondary | ICD-10-CM | POA: Diagnosis not present

## 2019-06-29 DIAGNOSIS — I2510 Atherosclerotic heart disease of native coronary artery without angina pectoris: Secondary | ICD-10-CM

## 2019-06-29 DIAGNOSIS — E785 Hyperlipidemia, unspecified: Secondary | ICD-10-CM

## 2019-06-29 NOTE — Progress Notes (Signed)
Cardiology Office Note:    Date:  07/01/2019   ID:  Bruce Mann, DOB [birth year], MRN [id number]  PCP:  Gaspar Garbe, MD  Cardiologist:  Peter Swaziland, MD  Electrophysiologist:  None   Referring MD: Gaspar Garbe, MD   Chief Complaint  Patient presents with  . Follow-up    seen for Dr. Swaziland.    History of Present Illness:    Bruce Mann is a 85 y.o. male with a hx of CAD, diastolic heart failure, and permanent atrial fibrillation on low-dose Xarelto adjusted for age and CKD. He had angioplasty of OM1 in 2001.  He had abnormal stress test in 2009 that led to another cardiac catheterization.  This demonstrated three-vessel disease with normal EF.  He was managed medically.  He was admitted in October 2017 with acute diastolic heart failure and left pleural effusion.  Echocardiogram at the time showed normal EF, biatrial enlargement, mild pulmonary hypertension.  He was diuresed and the discharged on carvedilol, ARB and diuretic.  This initially led to orthostatic hypotension, medication was later adjusted with improvement.   I last saw the patient via virtual visit in May 2020 at which time he was still going to the post office on a daily basis.  He presents today for cardiology office visit.  Heart rate is very well controlled on the low-dose carvedilol.  He is compliant with Xarelto.  Blood pressure is elevated today however normally it is well controlled.  I decided to hold off on increasing blood pressure medication.  Also lab work from October has been reviewed, cholesterol is very well controlled on current therapy, HDL chronically low.  Otherwise he only has trace amount of lower extremity edema, I do not think he is volume overloaded.  I will continue on the current therapy and have him come back to see Dr. Swaziland in 6 months.    Past Medical History:  Diagnosis Date  . Anemia   . Atrial fibrillation (HCC)   . CAD (coronary artery disease)   . CHF (congestive heart  failure) (HCC) 03/2016   NEW ONSET  . Diabetes mellitus type II   . Dyslipidemia   . Hyperlipidemia   . Hypertension   . Weakness 03/2016    Past Surgical History:  Procedure Laterality Date  . CARDIAC CATHETERIZATION     Ejection Fraction 60%  . CATARACT EXTRACTION, BILATERAL    . COLONOSCOPY    . TONSILLECTOMY      Current Medications: Current Meds  Medication Sig  . Acetaminophen (TYLENOL 8 HOUR PO) Take 2 tablets by mouth as needed.  Marland Kitchen atorvastatin (LIPITOR) 80 MG tablet Take 1 tablet (80 mg total) by mouth daily.  . Calcium Carbonate-Vitamin D (CALCIUM PLUS VITAMIN D PO) Take 1 tablet by mouth daily.   . carvedilol (COREG) 3.125 MG tablet Take 1 tablet (3.125 mg total) by mouth 2 (two) times daily with a meal.  . Choline Fenofibrate (FENOFIBRIC ACID) 135 MG CPDR TAKE 1 CAPSULE BY MOUTH ONCE DAILY  . Docusate Calcium (STOOL SOFTENER PO) Take 1-3 Doses by mouth daily.  . folic acid (FOLVITE) 1 MG tablet Take 1 tablet by mouth once daily  . furosemide (LASIX) 40 MG tablet Take 0.5 tablets (20 mg total) by mouth daily.  Marland Kitchen loratadine (CLARITIN) 10 MG tablet Take 10 mg by mouth daily.  Marland Kitchen losartan (COZAAR) 25 MG tablet TAKE 1/2 (ONE-HALF) TABLET BY MOUTH ONCE DAILY AT BEDTIME  . metFORMIN (GLUCOPHAGE) 500 MG tablet  Take 1 tablet (500 mg total) by mouth 2 (two) times daily with a meal.  . Multiple Vitamin (MULTIVITAMIN) tablet Take 1 tablet by mouth daily.  Marland Kitchen NITROSTAT 0.4 MG SL tablet DISSOLVE ONE TABLET UNDER THE TONGUE EVERY 5 MINUTES AS NEEDED FOR CHEST PAIN.  DO NOT EXCEED A TOTAL OF 3 DOSES IN 15 MINUTES  . Omega-3 Fatty Acids (OMEGA 3 500 PO) Take 1 capsule by mouth daily.  . potassium chloride SA (KLOR-CON) 20 MEQ tablet TAKE 1  BY MOUTH ONCE DAILY  . ranitidine (ZANTAC) 150 MG tablet Take 1 tablet (150 mg total) by mouth 2 (two) times daily.  . Rivaroxaban (XARELTO) 15 MG TABS tablet Take 1 tablet (15 mg total) by mouth daily with supper.     Allergies:   Patient has no  known allergies.   Social History   Socioeconomic History  . Marital status: Married    Spouse name: [name]  . Number of children: 4  . Years of education: Not on file  . Highest education level: Not on file  Occupational History  . Occupation: Naval architect  Tobacco Use  . Smoking status: Never Smoker  . Smokeless tobacco: Never Used  Substance and Sexual Activity  . Alcohol use: No  . Drug use: No  . Sexual activity: Not on file  Other Topics Concern  . Not on file  Social History Narrative  . Not on file   Social Determinants of Health   Financial Resource Strain:   . Difficulty of Paying Living Expenses: Not on file  Food Insecurity:   . Worried About Programme researcher, broadcasting/film/video in the Last Year: Not on file  . Ran Out of Food in the Last Year: Not on file  Transportation Needs:   . Lack of Transportation (Medical): Not on file  . Lack of Transportation (Non-Medical): Not on file  Physical Activity:   . Days of Exercise per Week: Not on file  . Minutes of Exercise per Session: Not on file  Stress:   . Feeling of Stress : Not on file  Social Connections:   . Frequency of Communication with Friends and Family: Not on file  . Frequency of Social Gatherings with Friends and Family: Not on file  . Attends Religious Services: Not on file  . Active Member of Clubs or Organizations: Not on file  . Attends Banker Meetings: Not on file  . Marital Status: Not on file     Family History: The patient's family history includes Heart attack in his brother and father; Stroke in his mother. There is no history of Colon cancer.  ROS:   Please see the history of present illness.     All other systems reviewed and are negative.  EKGs/Labs/Other Studies Reviewed:    The following studies were reviewed today:  Echo 04/17/2016 LV EF: 55% -   60% Study Conclusions  - Left ventricle: The cavity size was normal. Wall thickness was   increased in a pattern of  moderate LVH. Systolic function was   normal. The estimated ejection fraction was in the range of 55%   to 60%. Wall motion was normal; there were no regional wall   motion abnormalities. - Mitral valve: Moderately calcified annulus. - Left atrium: The atrium was mildly dilated. - Right ventricle: The cavity size was mildly dilated. - Right atrium: The atrium was severely dilated. - Pulmonary arteries: Systolic pressure was moderately increased.   PA peak pressure: 44  mm Hg (S). - Pericardium, extracardiac: There was a left pleural effusion.  EKG:  EKG is ordered today.  The ekg ordered today demonstrates atrial fibrillation, rate controlled heart rate 63, otherwise no significant ST-T wave changes  Recent Labs: No results found for requested labs within last 8760 hours.  Recent Lipid Panel No results found for: CHOL, TRIG, HDL, CHOLHDL, VLDL, LDLCALC, LDLDIRECT  Physical Exam:    VS:  BP (!) 153/75   Pulse 81   Temp (!) 97.5 F (36.4 C)   Ht 5\' 8"  (1.727 m)   Wt 159 lb 12.8 oz (72.5 kg)   SpO2 99%   BMI 24.30 kg/m     Wt Readings from Last 3 Encounters:  06/29/19 159 lb 12.8 oz (72.5 kg)  11/12/18 160 lb (72.6 kg)  03/22/18 164 lb 12.8 oz (74.8 kg)     GEN:  Well nourished, well developed in no acute distress HEENT: Normal NECK: No JVD; No carotid bruits LYMPHATICS: No lymphadenopathy CARDIAC: Irregularly irregular, no murmurs, rubs, gallops RESPIRATORY:  Clear to auscultation without rales, wheezing or rhonchi  ABDOMEN: Soft, non-tender, non-distended MUSCULOSKELETAL:  No edema; No deformity  SKIN: Warm and dry NEUROLOGIC:  Alert and oriented x 3 PSYCHIATRIC:  Normal affect   ASSESSMENT:    1. Permanent atrial fibrillation ([place])   2. Coronary artery disease involving native coronary artery of native heart without angina pectoris   3. Chronic diastolic heart failure ([place])   4. Hyperlipidemia LDL goal <70    PLAN:    In order of problems listed  above:  1. Permanent atrial fibrillation: On carvedilol and Xarelto.  Rate controlled  2. CAD: Denies any obvious anginal symptoms.  Continue Lipitor.  Not on aspirin given the need for Xarelto.  3. Chronic diastolic heart failure: Trace amount of lower extremity edema however he appears to be euvolemic.  4. Hyperlipidemia: Recent lipid panel obtained in October showed very well-controlled cholesterol.   Medication Adjustments/Labs and Tests Ordered: Current medicines are reviewed at length with the patient today.  Concerns regarding medicines are outlined above.  Orders Placed This Encounter  Procedures  . EKG 12-Lead   No orders of the defined types were placed in this encounter.   Patient Instructions  Medication Instructions:  Your physician recommends that you continue on your current medications as directed. Please refer to the Current Medication list given to you today.  *If you need a refill on your cardiac medications before your next appointment, please call your pharmacy*  Lab Work: NONE ordered at this time of appointment   If you have labs (blood work) drawn today and your tests are completely normal, you will receive your results only by: Marland Kitchen MyChart Message (if you have MyChart) OR . A paper copy in the mail If you have any lab test that is abnormal or we need to change your treatment, we will call you to review the results.  Testing/Procedures: NONE ordered at this time of appointment   Follow-Up: At [facility], you and your health needs are our priority.  As part of our continuing mission to provide you with exceptional heart care, we have created designated Provider Care Teams.  These Care Teams include your primary Cardiologist (physician) and Advanced Practice Providers (APPs -  Physician Assistants and Nurse Practitioners) who all work together to provide you with the care you need, when you need it.  Your next appointment:   6 month(s)  The format  for your next appointment:  In Person  Provider:   Peter SwazilandJordan, MD  Other Instructions      Signed, Azalee CourseHao Suly Vukelich, PA  07/01/2019 10:45 PM    [facility]

## 2019-06-29 NOTE — Patient Instructions (Signed)

## 2019-07-01 ENCOUNTER — Encounter: Payer: Self-pay | Admitting: Physician Assistant

## 2019-07-15 ENCOUNTER — Other Ambulatory Visit: Payer: Self-pay | Admitting: Cardiology

## 2019-07-30 ENCOUNTER — Ambulatory Visit: Payer: Medicare Other

## 2019-08-06 ENCOUNTER — Ambulatory Visit: Payer: Medicare Other

## 2019-08-20 ENCOUNTER — Ambulatory Visit: Payer: Medicare Other

## 2019-08-22 ENCOUNTER — Ambulatory Visit: Payer: Medicare Other

## 2019-08-25 ENCOUNTER — Other Ambulatory Visit: Payer: Self-pay | Admitting: Cardiology

## 2019-09-15 ENCOUNTER — Other Ambulatory Visit: Payer: Self-pay | Admitting: Cardiology

## 2019-10-20 ENCOUNTER — Other Ambulatory Visit: Payer: Self-pay | Admitting: Internal Medicine

## 2019-10-20 DIAGNOSIS — R413 Other amnesia: Secondary | ICD-10-CM

## 2019-11-10 ENCOUNTER — Other Ambulatory Visit: Payer: Self-pay

## 2019-11-10 ENCOUNTER — Ambulatory Visit
Admission: RE | Admit: 2019-11-10 | Discharge: 2019-11-10 | Disposition: A | Discharge status: Home / Self Care | Payer: Medicare Other | Source: Ambulatory Visit | Attending: Internal Medicine | Admitting: Internal Medicine

## 2019-11-10 ENCOUNTER — Other Ambulatory Visit: Payer: Medicare Other

## 2019-11-10 DIAGNOSIS — R413 Other amnesia: Secondary | ICD-10-CM

## 2019-12-15 ENCOUNTER — Other Ambulatory Visit: Payer: Self-pay | Admitting: Cardiology

## 2020-01-05 ENCOUNTER — Telehealth: Payer: Self-pay | Admitting: Cardiology

## 2020-01-05 DIAGNOSIS — I2510 Atherosclerotic heart disease of native coronary artery without angina pectoris: Secondary | ICD-10-CM

## 2020-01-05 MED ORDER — NITROSTAT 0.4 MG SL SUBL: SUBLINGUAL_TABLET | SUBLINGUAL | 11 refills | Status: DC

## 2020-01-05 NOTE — Telephone Encounter (Signed)
Spoke with pt wife, the patient is not having any problems and just realized his current bottle had expired. Refill sent to the pharmacy electronically.

## 2020-01-05 NOTE — Telephone Encounter (Signed)
Patient's wife, [name], called and stated that she tried to request a refill on Nitroglycerin for the patient, but it was off of the list. Patient's wife advised that if it can be filled, it can be sent to [facility] [address] [place], [place] - [address].

## 2020-01-26 ENCOUNTER — Encounter: Payer: Self-pay | Admitting: Neurology

## 2020-01-30 ENCOUNTER — Ambulatory Visit: Payer: Medicare Other | Admitting: Neurology

## 2020-01-30 ENCOUNTER — Encounter: Payer: Self-pay | Admitting: Neurology

## 2020-01-30 VITALS — BP 162/81 | HR 80 | Ht 68.0 in | Wt 146.0 lb

## 2020-01-30 DIAGNOSIS — F0390 Unspecified dementia without behavioral disturbance: Secondary | ICD-10-CM

## 2020-01-30 DIAGNOSIS — I4811 Longstanding persistent atrial fibrillation: Secondary | ICD-10-CM | POA: Diagnosis not present

## 2020-01-30 DIAGNOSIS — I5032 Chronic diastolic (congestive) heart failure: Secondary | ICD-10-CM | POA: Diagnosis not present

## 2020-01-30 MED ORDER — DONEPEZIL HCL 5 MG PO TABS: 5.0000 mg | ORAL_TABLET | Freq: Every day | ORAL | 2 refills | Status: DC

## 2020-01-30 NOTE — Patient Instructions (Addendum)
Alzheimer Disease Caregiver Guide  Alzheimer disease causes a person to lose the ability to remember things and make decisions. A person who has Alzheimer disease may not be able to take care of himself or herself. He or she may need help with simple tasks. The tips below can help you care for the person. What kind of changes does this condition cause? This condition makes a person:  Forget things.  Feel confused.  Act differently.  Have different moods. These things get worse with time. Tips to help with symptoms  Be calm and patient.  Respond with a simple, short answer.  Avoid correcting the person in a negative way.  Try not to take things personally, even if the person forgets your name.  Do not argue with the person. This may make the person more upset. Tips to lessen frustration  Make appointments and do daily tasks when the person is at his or her best.  Take your time. Simple tasks may take longer. Allow plenty of time to complete tasks.  Limit choices for the person.  Involve the person in what you are doing.  Keep a daily routine.  Avoid new or crowded places, if possible.  Use simple words, short sentences, and a calm voice. Only give one direction at a time.  Buy clothes and shoes that are easy to put on and take off.  Organize medicines in a pillbox for each day of the week.  Keep a calendar in a central location to remind the person of meetings or other activities.  Let people help if they offer. Take a break when needed. Tips to prevent injury  Keep floors clear. Remove rugs, magazine racks, and floor lamps.  Keep hallways well-lit.  Put a handrail and non-slip mat in the bathtub or shower.  Put childproof locks on cabinets that have dangerous items in them. These items include medicine, alcohol, guns, toxic cleaning items, sharp tools, matches, and lighters.  Put locks on doors where the person cannot see or reach them. This helps the person  to not wander out of the house and get lost.  Be prepared for emergencies. Keep a list of emergency phone numbers and addresses close by.  Bracelets may be worn that track location and identify the person as having memory problems. This should be worn at all times for safety. Tips for the future  Discuss financial and legal planning early. People with this disease have trouble managing their money as the disease gets worse. Get help from a professional.  Talk about advance directives, safety, and daily care. Take these steps: ? Create a living will and choose a power of attorney. This is someone who can make decisions for the person with Alzheimer disease when he or she can no longer do so. ? Discuss driving safety and when to stop driving. The person's doctor can help with this. ? If the person lives alone, make sure he or she is safe. Some people need extra help at home. Other people need more care at a nursing home or care center. Where to find support You can find support by joining a support group near you. Some benefits of joining a support group include:  Learning ways to manage stress.  Sharing experiences with others.  Getting emotional comfort and support.  Learning about caregiving as the disease progresses.  Knowing what community resources are available and making use of them. Where to find more information  Alzheimer's Association: [employer] Contact a doctor if:  The person has a fever.  The person has a sudden behavior change that does not get better with calming strategies.  The person is not able to take care of himself or herself at home.  The person threatens you or anyone else, including himself or herself.  You are no longer able to care for the person. Summary  Alzheimer disease causes a person to forget things and to be confused.  A person who has this condition may not be able to take care of himself or herself.  Take steps to keep the person  from getting hurt. Plan for future care.  You can find support by joining a support group near you. This information is not intended to replace advice given to you by your health care provider. Make sure you discuss any questions you have with your health care provider. Document Revised: 10/05/2018 Document Reviewed: 06/11/2017 Elsevier Patient Education  2020 Elsevier Inc. Management of Memory Problems  There are some general things you can do to help manage your memory problems.  Your memory may not in fact recover, but by using techniques and strategies you will be able to manage your memory difficulties better.  1)  Establish a routine.  Try to establish and then stick to a regular routine.  By doing this, you will get used to what to expect and you will reduce the need to rely on your memory.  Also, try to do things at the same time of day, such as taking your medication or checking your calendar first thing in the morning.  Think about think that you can do as a part of a regular routine and make a list.  Then enter them into a daily planner to remind you.  This will help you establish a routine.  2)  Organize your environment.  Organize your environment so that it is uncluttered.  Decrease visual stimulation.  Place everyday items such as keys or cell phone in the same place every day (ie.  Basket next to front door)  Use post it notes with a brief message to yourself (ie. Turn off light, lock the door)  Use labels to indicate where things go (ie. Which cupboards are for food, dishes, etc.)  Keep a notepad and pen by the telephone to take messages  3)  Memory Aids  A diary or journal/notebook/daily planner  Making a list (shopping list, chore list, to do list that needs to be done)  Using an alarm as a reminder (kitchen timer or cell phone alarm)  Using cell phone to store information (Notes, Calendar, Reminders)  Calendar/White board placed in a prominent  position  Post-it notes  In order for memory aids to be useful, you need to have good habits.  It's no good remembering to make a note in your journal if you don't remember to look in it.  Try setting aside a certain time of day to look in journal.  4)  Improving mood and managing fatigue.  There may be other factors that contribute to memory difficulties.  Factors, such as anxiety, depression and tiredness can affect memory.  Regular gentle exercise can help improve your mood and give you more energy.  Simple relaxation techniques may help relieve symptoms of anxiety  Try to get back to completing activities or hobbies you enjoyed doing in the past.  Learn to pace yourself through activities to decrease fatigue.  Find out about some local support groups where you can share experiences with others.  Try and achieve 7-8 hours of sleep at night.  Donepezil tablets What is this medicine? DONEPEZIL (doe NEP e zil) is used to treat mild to moderate dementia caused by Alzheimer's disease. This medicine may be used for other purposes; ask your health care provider or pharmacist if you have questions. COMMON BRAND NAME(S): Aricept What should I tell my health care provider before I take this medicine? They need to know if you have any of these conditions:  asthma or other lung disease  difficulty passing urine  head injury  heart disease  history of irregular heartbeat  liver disease  seizures (convulsions)  stomach or intestinal disease, ulcers or stomach bleeding  an unusual or allergic reaction to donepezil, other medicines, foods, dyes, or preservatives  pregnant or trying to get pregnant  breast-feeding How should I use this medicine? Take this medicine by mouth with a glass of water. Follow the directions on the prescription label. You may take this medicine with or without food. Take this medicine at regular intervals. This medicine is usually taken before bedtime. Do  not take it more often than directed. Continue to take your medicine even if you feel better. Do not stop taking except on your doctor's advice. If you are taking the 23 mg donepezil tablet, swallow it whole; do not cut, crush, or chew it. Talk to your pediatrician regarding the use of this medicine in children. Special care may be needed. Overdosage: If you think you have taken too much of this medicine contact a poison control center or emergency room at once. NOTE: This medicine is only for you. Do not share this medicine with others. What if I miss a dose? If you miss a dose, take it as soon as you can. If it is almost time for your next dose, take only that dose, do not take double or extra doses. What may interact with this medicine? Do not take this medicine with any of the following medications:  certain medicines for fungal infections like itraconazole, fluconazole, posaconazole, and voriconazole  cisapride  dextromethorphan; quinidine  dronedarone  pimozide  quinidine  thioridazine This medicine may also interact with the following medications:  antihistamines for allergy, cough and cold  atropine  bethanechol  carbamazepine  certain medicines for bladder problems like oxybutynin, tolterodine  certain medicines for Parkinson's disease like benztropine, trihexyphenidyl  certain medicines for stomach problems like dicyclomine, hyoscyamine  certain medicines for travel sickness like scopolamine  dexamethasone  dofetilide  ipratropium  NSAIDs, medicines for pain and inflammation, like ibuprofen or naproxen  other medicines for Alzheimer's disease  other medicines that prolong the QT interval (cause an abnormal heart rhythm)  phenobarbital  phenytoin  rifampin, rifabutin or rifapentine  ziprasidone This list may not describe all possible interactions. Give your health care provider a list of all the medicines, herbs, non-prescription drugs, or dietary  supplements you use. Also tell them if you smoke, drink alcohol, or use illegal drugs. Some items may interact with your medicine. What should I watch for while using this medicine? Visit your doctor or health care professional for regular checks on your progress. Check with your doctor or health care professional if your symptoms do not get better or if they get worse. You may get drowsy or dizzy. Do not drive, use machinery, or do anything that needs mental alertness until you know how this drug affects you. What side effects may I notice from receiving this medicine? Side effects that you should report to  your doctor or health care professional as soon as possible:  allergic reactions like skin rash, itching or hives, swelling of the face, lips, or tongue  feeling faint or lightheaded, falls  loss of bladder control  seizures  signs and symptoms of a dangerous change in heartbeat or heart rhythm like chest pain; dizziness; fast or irregular heartbeat; palpitations; feeling faint or lightheaded, falls; breathing problems  signs and symptoms of infection like fever or chills; cough; sore throat; pain or trouble passing urine  signs and symptoms of liver injury like dark yellow or brown urine; general ill feeling or flu-like symptoms; light-colored stools; loss of appetite; nausea; right upper belly pain; unusually weak or tired; yellowing of the eyes or skin  slow heartbeat or palpitations  unusual bleeding or bruising  vomiting Side effects that usually do not require medical attention (report to your doctor or health care professional if they continue or are bothersome):  diarrhea, especially when starting treatment  headache  loss of appetite  muscle cramps  nausea  stomach upset This list may not describe all possible side effects. Call your doctor for medical advice about side effects. You may report side effects to FDA at 1-800-FDA-1088. Where should I keep my  medicine? Keep out of reach of children. Store at room temperature between 15 and 30 degrees C (59 and 86 degrees F). Throw away any unused medicine after the expiration date. NOTE: This sheet is a summary. It may not cover all possible information. If you have questions about this medicine, talk to your doctor, pharmacist, or health care provider.  2020 Elsevier/Gold Standard (2018-06-07 10:33:41)

## 2020-01-30 NOTE — Progress Notes (Signed)
.       Provider:  Melvyn Novas, MD  Primary Care Physician:  Gaspar Garbe, MD [address] [place] [address]     Referring Provider: Gaspar Garbe, Md [address] [place],  [place] [address]          Chief Complaint according to patient   Patient presents with:    . New Patient (Initial Visit)     pt with wife, memory issues and confusions. tells the same story and asks same questions over and over. mental function is not what it used to be. He is requring assistance. started to begin to notice initially couple years ago but it has progressed. CT scan completed 10/2019      HISTORY OF PRESENT ILLNESS:  ANTAWAN MCHUGH is a 86 year old Caucasian male patient seen here as a referral on 01/30/2020 .  Chief concern according to patient : memory loss -   I have the pleasure of seeing ARRIS MEYN today, a right-handed White or Caucasian male with a possible dementia  disorder.  he has a past medical history of Anemia, Atrial fibrillation (HCC) 11/06/12), CAD (coronary artery disease), CHF (congestive heart failure) (HCC) (03/2016), Diabetes mellitus type II 07-Nov-2003), Dyslipidemia,Hiatal hernia, Hyperlipidemia, Hypertension, Shingles (05/2017) on the right upper back.   Family medical history: No other family member with memory loss. Brother died in 2022/11/07 and had few memory issues at age 47.    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 He lives in a household with 2 persons. Family status is married with 4 children, 5 grandchildren.  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  Pets are not present. Tobacco use-  none.   ETOH use : seldomly,  Caffeine intake in form of Coffee( 1 cup decaffeinated ) Soda( social ) Tea ( /) or energy drinks.  Patient has noted difficulties to answer questions, is unsure about number of grandchildren. Twice a week in the gym. He does drive in town, not  highway.  He has had days when he felt unable to imaging to plan the route to a location. He is a native [place] resident.      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   No real complaints.  No flowsheet data found.  MMSE - Mini Mental State Exam 01/30/2020  Orientation to time 4  Orientation to Place 3  Registration 3  Attention/ Calculation 1  Recall 0  Language- name 2 objects 2  Language- repeat 1  Language- follow 3 step command 3  Language- read & follow direction 1  Write a sentence 1  Copy design 1  Total score 20      Social History   Socioeconomic History  . Marital status: Married    Spouse name: [name]  . Number of children: 4  . Years of education: Not on file  . Highest education level: Not on file  Occupational History  . Occupation: Naval architect  Tobacco Use  . Smoking status: Never Smoker  . Smokeless tobacco: Never Used  Vaping Use  . Vaping Use: Never used  Substance and Sexual Activity  . Alcohol use: Yes    Comment: Occasional glass of wine  . Drug use:  No  . Sexual activity: Not on file  Other Topics Concern  . Not on file  Social History Narrative  . Not on file   Social Determinants of Health   Financial Resource Strain:   . Difficulty of Paying Living Expenses:   Food Insecurity:   . Worried About Programme researcher, broadcasting/film/video in the Last Year:   . Barista in the Last Year:   Transportation Needs:   . Freight forwarder (Medical):   Marland Kitchen Lack of Transportation (Non-Medical):   Physical Activity:   . Days of Exercise per Week:   . Minutes of Exercise per Session:   Stress:   . Feeling of Stress :   Social Connections:   . Frequency of Communication with Friends and Family:   . Frequency of Social Gatherings with Friends and Family:   . Attends Religious  Services:   . Active Member of Clubs or Organizations:   . Attends Banker Meetings:   Marland Kitchen Marital Status:     Family History  Problem Relation Age of Onset  . Stroke Mother   . Heart attack Father   . Heart attack Brother   . Neuropathy Brother   . Colon cancer Neg Hx     Past Medical History:  Diagnosis Date  . Anemia   . Atrial fibrillation (HCC) 2014  . CAD (coronary artery disease)   . CHF (congestive heart failure) (HCC) 03/2016   [place]  . Diabetes mellitus type II 2005  . Dyslipidemia   . Erectile dysfunction   . Gait instability   . Hiatal hernia   . Hyperlipidemia   . Hypertension   . Shingles 05/2017  . Weakness 03/2016    Past Surgical History:  Procedure Laterality Date  . CARDIAC CATHETERIZATION     Ejection Fraction 60%  . CATARACT EXTRACTION, BILATERAL    . COLONOSCOPY    . TONSILLECTOMY       Current Outpatient Medications on File Prior to Visit  Medication Sig Dispense Refill  . Acetaminophen (TYLENOL 8 HOUR PO) Take 2 tablets by mouth as needed.    Marland Kitchen atorvastatin (LIPITOR) 80 MG tablet Take 1 tablet (80 mg total) by mouth daily. 90 tablet 3  . Calcium Carbonate-Vitamin D (CALCIUM PLUS VITAMIN D PO) Take 1 tablet by mouth daily.     . carvedilol (COREG) 3.125 MG tablet Take 1 tablet (3.125 mg total) by mouth 2 (two) times daily with a meal. 180 tablet 3  . Choline Fenofibrate (FENOFIBRIC ACID) 135 MG CPDR TAKE 1 CAPSULE BY MOUTH ONCE DAILY 90 capsule 2  . diphenhydrAMINE (BENADRYL) 25 MG tablet Take 25 mg by mouth at bedtime.    [name] Calcium (STOOL SOFTENER PO) Take 1-3 Doses by mouth daily.    . famotidine (PEPCID) 20 MG tablet Take 20 mg by mouth 2 (two) times daily.    . furosemide (LASIX) 40 MG tablet Take 1/2 (one-half) tablet by mouth once daily (Patient taking differently: Take 40 mg by mouth daily. ) 45 tablet 3  . losartan (COZAAR) 25 MG tablet Take 25 mg by mouth at bedtime.    . metFORMIN (GLUCOPHAGE) 500 MG tablet  Take 1 tablet (500 mg total) by mouth 2 (two) times daily with a meal.    . Multiple Vitamin (MULTIVITAMIN) tablet Take 1 tablet by mouth daily.    Marland Kitchen NITROSTAT 0.4 MG SL tablet DISSOLVE ONE TABLET UNDER THE TONGUE EVERY 5 MINUTES AS NEEDED FOR  CHEST PAIN.  DO NOT EXCEED A TOTAL OF 3 DOSES IN 15 MINUTES 25 tablet 11  . Omega-3 Fatty Acids (OMEGA 3 500 PO) Take 1 capsule by mouth daily.    Marland Kitchen. oxybutynin (DITROPAN-XL) 5 MG 24 hr tablet Take 5 mg by mouth 2 (two) times daily.    . potassium chloride SA (KLOR-CON) 20 MEQ tablet TAKE 1  BY MOUTH ONCE DAILY 90 tablet 2  . Rivaroxaban (XARELTO) 15 MG TABS tablet Take 1 tablet (15 mg total) by mouth daily with supper. 90 tablet 3  . calcium carbonate (TUMS - DOSED IN MG ELEMENTAL CALCIUM) 500 MG chewable tablet Chew 1 tablet by mouth daily as needed for indigestion or heartburn.    . pseudoephedrine (SUDAFED) 60 MG tablet Take 60 mg by mouth every 4 (four) hours as needed for congestion.     No current facility-administered medications on file prior to visit.     CT was normal for age,  No Known Allergies  Physical exam:  Today's Vitals   01/30/20 1401  BP: (!) 162/81  Pulse: 80  Weight: 146 lb (66.2 kg)  Height: 5\' 8"  (1.727 m)   Body mass index is 22.2 kg/m.   Wt Readings from Last 3 Encounters:  01/30/20 146 lb (66.2 kg)  06/29/19 159 lb 12.8 oz (72.5 kg)  11/12/18 160 lb (72.6 kg)     Ht Readings from Last 3 Encounters:  01/30/20 5\' 8"  (1.727 m)  06/29/19 5\' 8"  (1.727 m)  11/12/18 5\' 8"  (1.727 m)      General: The patient is awake, alert and appears not in acute distress. The patient is well groomed. Head: Normocephalic, atraumatic. Neck is supple.Cardiovascular:  Regular rate and cardiac rhythm by pulse,  without distended neck veins. Respiratory: Lungs are clear to auscultation.  Skin:  Without evidence of ankle edema, or rash. Trunk: The patient's posture is erect.   Neurologic exam : The patient is awake and alert,  oriented to place and time.   Memory subjective described as intact.  Attention span & concentration ability appears normal.  Speech is fluent,  without  dysarthria, dysphonia or aphasia.  Mood and affect are appropriate.   Cranial nerves: no loss of smell or taste reported  Pupils are equal and briskly reactive to light. Funduscopic exam   Extraocular movements in vertical and horizontal planes were intact and without nystagmus. No Diplopia. Visual fields by finger perimetry are intact. Hearing was intact to soft voice and finger rubbing.    Facial sensation intact to fine touch.  Facial motor strength is symmetric and tongue and uvula move midline.  Neck ROM : rotation, tilt and flexion extension were normal for age and shoulder shrug was symmetrical.    Motor exam:  Symmetric bulk, tone and ROM.   Normal tone without cog wheeling, symmetric grip strength .   Sensory:  Fine touch,and vibration were normal.  Proprioception tested in the upper extremities was normal.   Coordination: Rapid alternating movements in the fingers/hands were of normal speed.  The Finger-to-nose maneuver was intact without evidence of ataxia, dysmetria or tremor.   Gait and station: Patient could rise unassisted from a seated position, walked without assistive device.  Stance is of normal width/ base and the patient turned with 4 steps.  Toe and heel walk were deferred.  Deep tendon reflexes: in the  upper and lower extremities are symmetric and intact.  Babinski response was deferred.     Dr. Wylene Simmerisovec reported some  gait difficulties - and the patient fell at [facility] , he had been walking very fast. He chased his point of gravity.He has no cogwheeling. No masked face.  I reviewed the CT with him and his wife of 46 years, [place].    After spending a total time of  45 minutes face to face and additional time for physical and neurologic examination, review of laboratory studies,  personal review of imaging  studies, reports and results of other testing and review of referral information / records as far as provided in visit, I have established the following assessments:  1) I have the pleasure of meeting Mr. and Mrs. Brueckner today on 30 January 2020.  The patient is a 86 year old Caucasian right-handed gentleman who has had some tendency to fall, but has no associated symptoms of tremor, cogwheeling or masked facial expression.  Overall seems to be a short-term memory deficit but no deficit for intermediate or long-term events.  He is still driving within the residential realm. His CT shows a generalized atrophy which is expected for his numeric age there is no focal significant atrophy noted.  At this time I would consider his memory loss age-related but I cannot exclude that Alzheimer could develop.    At a Mini-Mental status exam of 20 out of 30 points this is a moderate and not severe memory loss.  Comorbidities exist including chronic diastolic heart failure congestive heart failure, anemia, hypertensive heart disease and CKD which have been low-grade.  Atrial fibrillation which is permanent.  He has been chronically anticoagulated.    In review of his medications I would not make any significant changes, he is not on medications to treat memory loss, there is one medication that may worsen memory loss which is oxybutynin or Detrol which tones bladder function but is an anticholinergic medication and therefore interferes with the primary memory neurotransmitter which is cholinergic. I discussed this medication- he apparently had a lot of urinary frequency and urge , and therefor  He would prefer to stay on this medication.      My Plan is to proceed with:  1) a new MMSE in 5-6 month  2) start on Aricept. 5 mg.  3) Rv with NP in 5-6 month   I would like to thank Tisovec, Adelfa Koh, MD and Gaspar Garbe, Md [address] [place],  [place] [address] for allowing me to meet with and to take care of  this pleasant patient.   In short, Felice L Hessling is presenting with dementia, moderate degree , of unspecified type. Likely age related and alzheimer's dementia comes to mind.   Electronically signed by: Melvyn Novas, MD 01/30/2020 2:13 PM  [facility] and [facility] Board certified by The [employer] of Sleep Medicine and Diplomate of the [employer] of Sleep Medicine. Board certified In Neurology through the ABPN, Fellow of the [employer] of Neurology. Medical Director of [facility].

## 2020-05-10 ENCOUNTER — Telehealth: Payer: Self-pay | Admitting: Neurology

## 2020-05-10 MED ORDER — DONEPEZIL HCL 10 MG PO TABS: 10.0000 mg | ORAL_TABLET | Freq: Every day | ORAL | 1 refills | Status: DC

## 2020-05-10 NOTE — Telephone Encounter (Signed)
Since the patient is tolerating the medication, Dr Vickey Huger did state that she would increase the dose to 10 mg at bedtime. Order placed and sent to the pharmacy for the patient.

## 2020-05-10 NOTE — Telephone Encounter (Signed)
Pt's wife called stating that they were informed to call after the pt has been on the donepezil (ARICEPT) 5 MG tablet for 3 months with an update on how he is doing on it. Wife states that she has not noticed any difference but that he has not gotten worse. She is wanting to know if there will be an increase on dosage. Pt is out of refills at this time. Please advise.

## 2020-05-10 NOTE — Telephone Encounter (Signed)
Called and there was no answer. LVM advising the script was sent to the pharmacy for the patient and the dose increase was made.

## 2020-05-17 ENCOUNTER — Other Ambulatory Visit: Payer: Self-pay | Admitting: Cardiology

## 2020-05-23 ENCOUNTER — Other Ambulatory Visit: Payer: Self-pay | Admitting: Cardiology

## 2020-05-30 ENCOUNTER — Other Ambulatory Visit: Payer: Self-pay | Admitting: Cardiology

## 2020-06-06 ENCOUNTER — Other Ambulatory Visit: Payer: Self-pay | Admitting: Cardiology

## 2020-07-04 ENCOUNTER — Other Ambulatory Visit: Payer: Self-pay | Admitting: Cardiology

## 2020-07-09 NOTE — Progress Notes (Deleted)
Cardiology Office Note:    Date:  07/09/2020   ID:  Bruce Mann, DOB Sep 20, 1930, MRN [id number]  PCP:  Gaspar Garbe, MD  Cardiologist:  Brycelynn Stampley [place], MD  Electrophysiologist:  None   Referring MD: Gaspar Garbe, MD   No chief complaint on file.   History of Present Illness:    Bruce Mann is a 87 y.o. male with a hx of CAD, diastolic heart failure, and permanent atrial fibrillation on low-dose Xarelto adjusted for age and CKD. He had angioplasty of OM1 in 2001.  He had abnormal stress test in 2009 that led to another cardiac catheterization.  This demonstrated three-vessel disease with normal EF.  He was managed medically.  He was admitted in October 2017 with acute diastolic heart failure and left pleural effusion.  Echocardiogram at the time showed normal EF, biatrial enlargement, mild pulmonary hypertension.  He was diuresed and the discharged on carvedilol, ARB and diuretic.  This initially led to orthostatic hypotension, medication was later adjusted with improvement.      Past Medical History:  Diagnosis Date  . Anemia   . Atrial fibrillation (HCC) 2014  . CAD (coronary artery disease)   . CHF (congestive heart failure) (HCC) 03/2016   NEW ONSET  . Diabetes mellitus type II 2005  . Dyslipidemia   . Erectile dysfunction   . Gait instability   . Hiatal hernia   . Hyperlipidemia   . Hypertension   . Shingles 05/2017  . Weakness 03/2016    Past Surgical History:  Procedure Laterality Date  . CARDIAC CATHETERIZATION     Ejection Fraction 60%  . CATARACT EXTRACTION, BILATERAL    . COLONOSCOPY    . TONSILLECTOMY      Current Medications: No outpatient medications have been marked as taking for the 07/13/20 encounter (Appointment) with [place], Vallie Fayette M, MD.     Allergies:   Patient has no known allergies.   Social History   Socioeconomic History  . Marital status: Married    Spouse name: [name]  . Number of children: 4  . Years of education: Not  on file  . Highest education level: Not on file  Occupational History  . Occupation: Naval architect  Tobacco Use  . Smoking status: Never Smoker  . Smokeless tobacco: Never Used  Vaping Use  . Vaping Use: Never used  Substance and Sexual Activity  . Alcohol use: Yes    Comment: Occasional glass of wine  . Drug use: No  . Sexual activity: Not on file  Other Topics Concern  . Not on file  Social History Narrative  . Not on file   Social Determinants of Health   Financial Resource Strain: Not on file  Food Insecurity: Not on file  Transportation Needs: Not on file  Physical Activity: Not on file  Stress: Not on file  Social Connections: Not on file     Family History: The patient's family history includes Heart attack in his brother and father; Neuropathy in his brother; Stroke in his mother. There is no history of Colon cancer.  ROS:   Please see the history of present illness.     All other systems reviewed and are negative.  EKGs/Labs/Other Studies Reviewed:    The following studies were reviewed today:  Echo 04/17/2016 LV EF: 55% -   60% Study Conclusions  - Left ventricle: The cavity size was normal. Wall thickness was   increased in a pattern of moderate LVH. Systolic  function was   normal. The estimated ejection fraction was in the range of 55%   to 60%. Wall motion was normal; there were no regional wall   motion abnormalities. - Mitral valve: Moderately calcified annulus. - Left atrium: The atrium was mildly dilated. - Right ventricle: The cavity size was mildly dilated. - Right atrium: The atrium was severely dilated. - Pulmonary arteries: Systolic pressure was moderately increased.   PA peak pressure: 44 mm Hg (S). - Pericardium, extracardiac: There was a left pleural effusion.  EKG:  EKG is ordered today.  The ekg ordered today demonstrates atrial fibrillation, rate controlled heart rate 63, otherwise no significant ST-T wave changes  Recent  Labs: No results found for requested labs within last 8760 hours.  Recent Lipid Panel No results found for: CHOL, TRIG, HDL, CHOLHDL, VLDL, LDLCALC, LDLDIRECT   Dated 10/18/19: cholesterol 103, triglycerides 103, HDL 35, LDL 47. A1c 6.1%. creatinine 1.3. otherwise CMET, TSH, and CBC normal.   Physical Exam:    VS:  There were no vitals taken for this visit.    Wt Readings from Last 3 Encounters:  01/30/20 146 lb (66.2 kg)  06/29/19 159 lb 12.8 oz (72.5 kg)  11/12/18 160 lb (72.6 kg)     GEN:  Well nourished, well developed in no acute distress HEENT: Normal NECK: No JVD; No carotid bruits LYMPHATICS: No lymphadenopathy CARDIAC: Irregularly irregular, no murmurs, rubs, gallops RESPIRATORY:  Clear to auscultation without rales, wheezing or rhonchi  ABDOMEN: Soft, non-tender, non-distended MUSCULOSKELETAL:  No edema; No deformity  SKIN: Warm and dry NEUROLOGIC:  Alert and oriented x 3 PSYCHIATRIC:  Normal affect   ASSESSMENT:    No diagnosis found. PLAN:    In order of problems listed above:  1. Permanent atrial fibrillation: On carvedilol and Xarelto.  Rate controlled  2. CAD: Denies any obvious anginal symptoms.  Continue Lipitor.  Not on aspirin given the need for Xarelto.  3. Chronic diastolic heart failure: Trace amount of lower extremity edema however he appears to be euvolemic.  4. Hyperlipidemia: Recent lipid panel obtained in October showed very well-controlled cholesterol.   Medication Adjustments/Labs and Tests Ordered: Current medicines are reviewed at length with the patient today.  Concerns regarding medicines are outlined above.  No orders of the defined types were placed in this encounter.  No orders of the defined types were placed in this encounter.   There are no Patient Instructions on file for this visit.   Signed, Yazmine Sorey [place], MD  07/09/2020 3:24 PM    [facility]

## 2020-07-13 ENCOUNTER — Ambulatory Visit: Payer: Medicare Other | Admitting: Cardiology

## 2020-08-01 ENCOUNTER — Encounter: Payer: Self-pay | Admitting: Family Medicine

## 2020-08-01 ENCOUNTER — Ambulatory Visit: Payer: Medicare Other | Admitting: Family Medicine

## 2020-08-01 ENCOUNTER — Other Ambulatory Visit: Payer: Self-pay

## 2020-08-01 VITALS — BP 142/70 | HR 61 | Ht 67.0 in | Wt 146.0 lb

## 2020-08-01 DIAGNOSIS — F0390 Unspecified dementia without behavioral disturbance: Secondary | ICD-10-CM | POA: Diagnosis not present

## 2020-08-01 MED ORDER — MEMANTINE HCL 5 MG PO TABS: 5.0000 mg | ORAL_TABLET | Freq: Two times a day (BID) | ORAL | 0 refills | Status: DC

## 2020-08-01 NOTE — Patient Instructions (Addendum)
Below is our plan:  We will continue Aricept (donepizil) 10mg  daily. I have called in Namenda (memantine) 5mg  twice daily. Start 5mg  daily for 2 weeks then increase dose to 5mg  twice daily. Let me know if you have any unusual side effects.   Please make sure you are staying well hydrated. I recommend 50-60 ounces daily. Well balanced diet and regular exercise encouraged.    Please continue follow up with care team as directed.   Follow up in 3-6 months   You may receive a survey regarding today's visit. I encourage you to leave honest feed back as I do use this information to improve patient care. Thank you for seeing me today!    Memantine Tablets What is this medicine? MEMANTINE (MEM an teen) is used to treat dementia caused by Alzheimer's disease. This medicine may be used for other purposes; ask your health care provider or pharmacist if you have questions. COMMON BRAND NAME(S): Namenda What should I tell my health care provider before I take this medicine? They need to know if you have any of these conditions:  difficulty passing urine  kidney disease  liver disease  seizures  an unusual or allergic reaction to memantine, other medicines, foods, dyes, or preservatives  pregnant or trying to get pregnant  breast-feeding How should I use this medicine? Take this medicine by mouth with a glass of water. Follow the directions on the prescription label. You may take this medicine with or without food. Take your doses at regular intervals. Do not take your medicine more often than directed. Continue to take your medicine even if you feel better. Do not stop taking except on the advice of your doctor or health care professional. Talk to your pediatrician regarding the use of this medicine in children. Special care may be needed. Overdosage: If you think you have taken too much of this medicine contact a poison control center or emergency room at once. NOTE: This medicine is  only for you. Do not share this medicine with others. What if I miss a dose? If you miss a dose, take it as soon as you can. If it is almost time for your next dose, take only that dose. Do not take double or extra doses. If you do not take your medicine for several days, contact your health care provider. Your dose may need to be changed. What may interact with this medicine?  acetazolamide  amantadine  cimetidine  dextromethorphan  dofetilide  hydrochlorothiazide  ketamine  metformin  methazolamide  quinidine  ranitidine  sodium bicarbonate  triamterene This list may not describe all possible interactions. Give your health care provider a list of all the medicines, herbs, non-prescription drugs, or dietary supplements you use. Also tell them if you smoke, drink alcohol, or use illegal drugs. Some items may interact with your medicine. What should I watch for while using this medicine? Visit your doctor or health care professional for regular checks on your progress. Check with your doctor or health care professional if there is no improvement in your symptoms or if they get worse. You may get drowsy or dizzy. Do not drive, use machinery, or do anything that needs mental alertness until you know how this drug affects you. Do not stand or sit up quickly, especially if you are an older patient. This reduces the risk of dizzy or fainting spells. Alcohol can make you more drowsy and dizzy. Avoid alcoholic drinks. What side effects may I notice from receiving this  medicine? Side effects that you should report to your doctor or health care professional as soon as possible:  allergic reactions like skin rash, itching or hives, swelling of the face, lips, or tongue  agitation or a feeling of restlessness  depressed mood  dizziness  hallucinations  redness, blistering, peeling or loosening of the skin, including inside the mouth  seizures  vomiting Side effects that  usually do not require medical attention (report to your doctor or health care professional if they continue or are bothersome):  constipation  diarrhea  headache  nausea  trouble sleeping This list may not describe all possible side effects. Call your doctor for medical advice about side effects. You may report side effects to FDA at 1-800-FDA-1088. Where should I keep my medicine? Keep out of the reach of children. Store at room temperature between 15 degrees and 30 degrees C (59 degrees and 86 degrees F). Throw away any unused medicine after the expiration date. NOTE: This sheet is a summary. It may not cover all possible information. If you have questions about this medicine, talk to your doctor, pharmacist, or health care provider.  2021 Elsevier/Gold Standard (2013-04-04 14:10:42)    Memory Compensation Strategies  1. Use "WARM" strategy.  W= write it down  A= associate it  R= repeat it  M= make a mental note  2.   You can keep a Glass blower/designer.  Use a 3-ring notebook with sections for the following: calendar, important names and phone numbers,  medications, doctors' names/phone numbers, lists/reminders, and a section to journal what you did  each day.   3.    Use a calendar to write appointments down.  4.    Write yourself a schedule for the day.  This can be placed on the calendar or in a separate section of the Memory Notebook.  Keeping a  regular schedule can help memory.  5.    Use medication organizer with sections for each day or morning/evening pills.  You may need help loading it  6.    Keep a basket, or pegboard by the door.  Place items that you need to take out with you in the basket or on the pegboard.  You may also want to  include a message board for reminders.  7.    Use sticky notes.  Place sticky notes with reminders in a place where the task is performed.  For example: " turn off the  stove" placed by the stove, "lock the door" placed on the door  at eye level, " take your medications" on  the bathroom mirror or by the place where you normally take your medications.  8.    Use alarms/timers.  Use while cooking to remind yourself to check on food or as a reminder to take your medicine, or as a  reminder to make a call, or as a reminder to perform another task, etc.    Dementia Caregiver Guide Dementia is a term used to describe a number of symptoms that affect memory and thinking. The most common symptoms include:  Memory loss.  Trouble with language and communication.  Trouble concentrating.  Poor judgment and problems with reasoning.  Wandering from home or public places.  Extreme anxiety or depression.  Being suspicious or having angry outbursts and accusations.  Child-like behavior and language. Dementia can be frightening and confusing. And taking care of someone with dementia can be challenging. This guide provides tips to help you when providing care for  a person with dementia. How to help manage lifestyle changes Dementia usually gets worse slowly over time. In the early stages, people with dementia can stay independent and safe with some help. In later stages, they need help with daily tasks such as dressing, grooming, and using the bathroom. There are actions you can take to help a person manage his or her life while living with this condition. Communicating  When the person is talking or seems frustrated, make eye contact and hold the person's hand.  Ask specific questions that need yes or no answers.  Use simple words, short sentences, and a calm voice. Only give one direction at a time.  When offering choices, limit the person to just one or two.  Avoid correcting the person in a negative way.  If the person is struggling to find the right words, gently try to help him or her. Preventing injury  Keep floors clear of clutter. Remove rugs, magazine racks, and floor lamps.  Keep hallways well lit,  especially at night.  Put a handrail and nonslip mat in the bathtub or shower.  Put childproof locks on cabinets that contain dangerous items, such as medicines, alcohol, guns, toxic cleaning items, sharp tools or utensils, matches, and lighters.  For doors to the outside of the house, put the locks in places where the person cannot see or reach them easily. This will help ensure that the person does not wander out of the house and get lost.  Be prepared for emergencies. Keep a list of emergency phone numbers and addresses in a convenient area.  Remove car keys and lock garage doors so that the person does not try to get in the car and drive.  Have the person wear a bracelet that tracks locations and identifies the person as having memory problems. This should be worn at all times for safety.   Helping with daily life  Keep the person on track with his or her routine.  Try to identify areas where the person may need help.  Be supportive, patient, calm, and encouraging.  Gently remind the person that adjusting to changes takes time.  Help with the tasks that the person has asked for help with.  Keep the person involved in daily tasks and decisions as much as possible.  Encourage conversation, but try not to get frustrated if the person struggles to find words or does not seem to appreciate your help.   How to recognize stress Look for signs of stress in yourself and in the person you are caring for. If you notice signs of stress, take steps to manage it. Symptoms of stress include:  Feeling anxious, irritable, frustrated, or angry.  Denying that the person has dementia or that his or her symptoms will not improve.  Feeling depressed, hopeless, or unappreciated.  Difficulty sleeping.  Difficulty concentrating.  Developing stress-related health problems.  Feeling like you have too little time for your own life. Follow these instructions at home: Take care of your  health Make sure that you and the person you are caring for:  Get regular sleep.  Exercise regularly.  Eat regular, nutritious meals.  Take over-the-counter and prescription medicines only as told by your health care providers.  Drink enough fluid to keep your urine pale yellow.  Attend all scheduled health care appointments.   General instructions  Join a support group with others who are caregivers.  Ask about respite care resources. Respite care can provide short-term care for the person  so that you can have a regular break from the stress of caregiving.  Consider any safety risks and take steps to avoid them.  Organize medicines in a pill box for each day of the week.  Create a plan to handle any legal or financial matters. Get legal or financial advice if needed.  Keep a calendar in a central location to remind the person of appointments or other activities. Where to find support: Many individuals and organizations offer support. These include:  Support groups for people with dementia.  Support groups for caregivers.  Counselors or therapists.  Home health care services.  Adult day care centers. Where to find more information  Centers for Disease Control and Prevention: FootballExhibition.com.br  Alzheimer's Association: LimitLaws.hu  Family Caregiver Alliance: www.caregiver.org  Alzheimer's Foundation of [place]: www.alzfdn.org Contact a health care provider if:  The person's health is rapidly getting worse.  You are no longer able to care for the person.  Caring for the person is affecting your physical and emotional health.  You are feeling depressed or anxious about caring for the person. Get help right away if:  The person threatens himself or herself, you, or anyone else.  You feel depressed or sad, or feel that you want to harm yourself. If you ever feel like your loved one may hurt himself or herself or others, or if he or she shares thoughts about taking  his or her own life, get help right away. You can go to your nearest emergency department or:  Call your local emergency services (911 in the [place]  Call a suicide crisis helpline, such as the National Suicide Prevention Lifeline at 223-466-0782. This is open 24 hours a day in the [place]  Text the Crisis Text Line at 973-527-3342 (in the [place] Summary  Dementia is a term used to describe a number of symptoms that affect memory and thinking.  Dementia usually gets worse slowly over time.  Take steps to reduce the person's risk of injury and to plan for future care.  Caregivers need support, relief from caregiving, and time for their own lives. This information is not intended to replace advice given to you by your health care provider. Make sure you discuss any questions you have with your health care provider. Document Revised: 10/31/2019 Document Reviewed: 10/31/2019 Elsevier Patient Education  2021 [employer].

## 2020-08-01 NOTE — Progress Notes (Signed)
Chief Complaint  Patient presents with  . Follow-up    Rm 2 with wife (nancy)  Pt is well, wife says memory comes and goes      HISTORY OF PRESENT ILLNESS: 08/01/20  Bruce Mann is a 87 y.o. male here today for follow up for dementia. He was started on Aricept 5mg  in 01/2020 and increased dose to 10mg  in 04/2020. He has tolerated medication fairly well. Uncertain if they have noticed any improvements. He continues to have difficulty with short term memory is very repetitive in questioning. He is doing well, otherwise. He is considering going back to the gym to participate in a senior exercise program. He is not driving at this time due to some pain concerns. He continues to perform ADL's independently. He is walking without difficulty.    HISTORY (copied from Dr Dohmeier's previous note)  Bruce Mann a 87 year old Caucasian male patientseen here as a referralon 01/30/2020 . Chiefconcernaccording to patient : memory loss -  I have the pleasure of seeing Bruce Mann today,a right-handed White or Caucasian male with a possible dementia  disorder. he has a past medical history of Anemia, Atrial fibrillation (HCC) Nov 06, 2012), CAD (coronary artery disease), CHF (congestive heart failure) (HCC) (03/2016), Diabetes mellitus type II 2003-11-07), Dyslipidemia,Hiatal hernia, Hyperlipidemia, Hypertension, Shingles (05/2017) on the right upper back. Familymedical history:No other family member with memory loss. Brother died in 11-07-22 and had few memory issues at age 15.   Social history:Patient is a May, he retired from [employer]-  and  Is a Careers adviser. He lives in a household with 2 persons. Family status is married with 4 children, 5 grandchildren.  The patient currently works a little in Lexic[employer].  Pets are not present. Tobacco use-  none.  ETOH use : seldomly,  Caffeine intake in form of Coffee( 1 cup decaffeinated ) Soda( social ) Tea ( /) or  energy drinks.  Patient has noted difficulties to answer questions, is unsure about number of grandchildren. Twice a week in the gym. He does drive in town, not highway.  He has had days when he felt unable to imaging to plan the route to a location. He is a native Acupuncturist resident.     REVIEW OF SYSTEMS: Out of a complete 14 system review of symptoms, the patient complains only of the following symptoms, memory loss, bursitis and all other reviewed systems are negative.    ALLERGIES: No Known Allergies   HOME MEDICATIONS: Outpatient Medications Prior to Visit  Medication Sig Dispense Refill  . Acetaminophen (TYLENOL 8 HOUR PO) Take 2 tablets by mouth as needed.    Research officer, political party atorvastatin (LIPITOR) 80 MG tablet Take 1 tablet (80 mg total) by mouth daily. 90 tablet 3  . calcium carbonate (TUMS - DOSED IN MG ELEMENTAL CALCIUM) 500 MG chewable tablet Chew 1 tablet by mouth daily as needed for indigestion or heartburn.    . Calcium Carbonate-Vitamin D (CALCIUM PLUS VITAMIN D PO) Take 1 tablet by mouth daily.     . carvedilol (COREG) 3.125 MG tablet Take 1 tablet (3.125 mg total) by mouth 2 (two) times daily with a meal. 180 tablet 3  . Choline Fenofibrate (FENOFIBRIC ACID) 135 MG CPDR Take 1 capsule by mouth once daily 90 capsule 0  . diphenhydrAMINE (BENADRYL) 25 MG tablet Take 25 mg by mouth at bedtime.    [place] Calcium (STOOL SOFTENER PO) Take 1-3 Doses by mouth daily.    Marland Kitchen  donepezil (ARICEPT) 10 MG tablet Take 1 tablet (10 mg total) by mouth at bedtime. Please call us to increase this dose after a 3 month period. 90 tablet 1  . famotidine (PEPCID) 20 MG tablet Take 20 mg by mouth 2 (two) times daily.    . furosemide (LASIX) 40 MG tablet Take 1/2 (one-half) tablet by mouth once daily 45 tablet 0  . losartan (COZAAR) 25 MG tablet TAKE 1/2 (ONE-HALF) TABLET BY MOUTH ONCE DAILY AT BEDTIME 45 tablet 0  . metFORMIN (GLUCOPHAGE) 500 MG tablet Take 1 tablet (500 mg total) by mouth 2 (two)  times daily with a meal.    . Multiple Vitamin (MULTIVITAMIN) tablet Take 1 tablet by mouth daily.    Marland Kitchen MYRBETRIQ 25 MG TB24 tablet Take 25 mg by mouth daily.    Marland Kitchen NITROSTAT 0.4 MG SL tablet DISSOLVE ONE TABLET UNDER THE TONGUE EVERY 5 MINUTES AS NEEDED FOR CHEST PAIN.  DO NOT EXCEED A TOTAL OF 3 DOSES IN 15 MINUTES 25 tablet 11  . Omega-3 Fatty Acids (OMEGA 3 500 PO) Take 1 capsule by mouth daily.    Marland Kitchen oxybutynin (DITROPAN-XL) 5 MG 24 hr tablet Take 5 mg by mouth 2 (two) times daily.    . potassium chloride SA (KLOR-CON) 20 MEQ tablet TAKE 1  BY MOUTH ONCE DAILY 90 tablet 2  . predniSONE (DELTASONE) 5 MG tablet Take 5 mg by mouth 2 (two) times daily.    . pseudoephedrine (SUDAFED) 60 MG tablet Take 60 mg by mouth every 4 (four) hours as needed for congestion.    . Rivaroxaban (XARELTO) 15 MG TABS tablet Take 1 tablet (15 mg total) by mouth daily with supper. 90 tablet 3   No facility-administered medications prior to visit.     PAST MEDICAL HISTORY: Past Medical History:  Diagnosis Date  . Anemia   . Atrial fibrillation (HCC) 2014  . CAD (coronary artery disease)   . CHF (congestive heart failure) (HCC) 03/2016   NEW ONSET  . Diabetes mellitus type II 2005  . Dyslipidemia   . Erectile dysfunction   . Gait instability   . Hiatal hernia   . Hyperlipidemia   . Hypertension   . Shingles 05/2017  . Weakness 03/2016     PAST SURGICAL HISTORY: Past Surgical History:  Procedure Laterality Date  . CARDIAC CATHETERIZATION     Ejection Fraction 60%  . CATARACT EXTRACTION, BILATERAL    . COLONOSCOPY    . TONSILLECTOMY       FAMILY HISTORY: Family History  Problem Relation Age of Onset  . Stroke Mother   . Heart attack Father   . Heart attack Brother   . Neuropathy Brother   . Colon cancer Neg Hx      SOCIAL HISTORY: Social History   Socioeconomic History  . Marital status: Married    Spouse name: [name]  . Number of children: 4  . Years of education: Not on file   . Highest education level: Not on file  Occupational History  . Occupation: Naval architect  Tobacco Use  . Smoking status: Never Smoker  . Smokeless tobacco: Never Used  Vaping Use  . Vaping Use: Never used  Substance and Sexual Activity  . Alcohol use: Yes    Comment: Occasional glass of wine  . Drug use: No  . Sexual activity: Not on file  Other Topics Concern  . Not on file  Social History Narrative  . Not on file  Social Determinants of Health   Financial Resource Strain: Not on file  Food Insecurity: Not on file  Transportation Needs: Not on file  Physical Activity: Not on file  Stress: Not on file  Social Connections: Not on file  Intimate Partner Violence: Not on file      PHYSICAL EXAM  Vitals:   08/01/20 1321  BP: (!) 142/70  Pulse: 61  Weight: 146 lb (66.2 kg)  Height: 5\' 7"  (1.702 m)   Body mass index is 22.87 kg/m.   Generalized: Well developed, in no acute distress  Cardiology: normal rate and rhythm, no murmur auscultated  Respiratory: clear to auscultation bilaterally    Neurological examination  Mentation: Alert oriented to time, place, history taking. Follows all commands speech and language fluent Cranial nerve II-XII: Pupils were equal round reactive to light. Extraocular movements were full, visual field were full on confrontational test. Facial sensation and strength were normal. Head turning and shoulder shrug  were normal and symmetric. Motor: The motor testing reveals 5 over 5 strength of all 4 extremities. Good symmetric motor tone is noted throughout.  Sensory: Sensory testing is intact to soft touch on all 4 extremities. No evidence of extinction is noted.  Coordination: Cerebellar testing reveals good finger-nose-finger and heel-to-shin bilaterally.  Gait and station: Gait is stable without assistive device. He does have a slightly stooped posture today, could be related to pain.     DIAGNOSTIC DATA (LABS, IMAGING,  TESTING) - I reviewed patient records, labs, notes, testing and imaging myself where available.  Lab Results  Component Value Date   WBC 8.9 03/15/2018   HGB 12.5 (L) 03/15/2018   HCT 38.3 (L) 03/15/2018   MCV 94.6 03/15/2018   PLT 137 (L) 03/15/2018      Component Value Date/Time   NA 133 (L) 04/25/2016 1143   K 4.6 04/25/2016 1143   CL 98 04/25/2016 1143   CO2 26 04/25/2016 1143   GLUCOSE 119 (H) 04/25/2016 1143   BUN 36 (H) 04/25/2016 1143   CREATININE 1.43 (H) 04/25/2016 1143   CALCIUM 9.7 04/25/2016 1143   PROT 5.6 (L) 04/17/2016 0420   ALBUMIN 2.8 (L) 04/17/2016 0420   AST 133 (H) 04/17/2016 0420   ALT 75 (H) 04/17/2016 0420   ALKPHOS 34 (L) 04/17/2016 0420   BILITOT 1.3 (H) 04/17/2016 0420   GFRNONAA 45 (L) 04/25/2016 1143   GFRAA 52 (L) 04/25/2016 1143   No results found for: CHOL, HDL, LDLCALC, LDLDIRECT, TRIG, CHOLHDL No results found for: 04/27/2016 Lab Results  Component Value Date   VITAMINB12 583 04/17/2016   Lab Results  Component Value Date   TSH 5.557 (H) 04/16/2016    MMSE - Mini Mental State Exam 08/01/2020 01/30/2020  Orientation to time 4 4  Orientation to Place 3 3  Registration 3 3  Attention/ Calculation 0 1  Recall 0 0  Language- name 2 objects 2 2  Language- repeat 0 1  Language- follow 3 step command 3 3  Language- read & follow direction 1 1  Write a sentence 1 1  Copy design 0 1  Total score 17 20     No flowsheet data found.   ASSESSMENT AND PLAN  87 y.o. year old male  has a past medical history of Anemia, Atrial fibrillation (HCC) (2014), CAD (coronary artery disease), CHF (congestive heart failure) (HCC) (03/2016), Diabetes mellitus type II (2005), Dyslipidemia, Erectile dysfunction, Gait instability, Hiatal hernia, Hyperlipidemia, Hypertension, Shingles (05/2017), and Weakness (03/2016). here with  Dementia without behavioral disturbance, unspecified dementia type [facility])  Mr Boelens is doing fairly well, today.  He has  tolerated Aricept 10 mg daily.  We will continue this medication.  We have also discussed adding Namenda.  He and his wife are both in agreement.  He will start with 5 mg daily for 2 weeks then increase dose to 5 mg twice daily.  We will anticipate increasing dose to 10 mg twice daily if well tolerated.  I have encouraged him to continue regular physical and mental activity.  We have discussed safety measures should he decide to return to fitness center.  He is fully vaccinated with his booster.  He will continue close follow-up with primary care.  I will have him follow-up with me in 3 to 6 months.  He verbalizes understanding and agreement with this plan.  No orders of the defined types were placed in this encounter.    Meds ordered this encounter  Medications  . memantine (NAMENDA) 5 MG tablet    Sig: Take 1 tablet (5 mg total) by mouth 2 (two) times daily.    Dispense:  180 tablet    Refill:  0    Order Specific Question:   Supervising Provider    Answer:   Anson Fret [id number]      I spent 20 minutes of face-to-face and non-face-to-face time with patient.  This included previsit chart review, lab review, study review, order entry, electronic health record documentation, patient education.    Shawnie Dapper, MSN, FNP-C 08/01/2020, 2:05 PM  [facility] [address], [address] [place], [place] [address] [phone]

## 2020-09-07 ENCOUNTER — Other Ambulatory Visit: Payer: Self-pay | Admitting: Internal Medicine

## 2020-09-17 ENCOUNTER — Other Ambulatory Visit: Payer: Self-pay

## 2020-09-17 MED ORDER — LOSARTAN POTASSIUM 25 MG PO TABS: ORAL_TABLET | ORAL | 0 refills | Status: DC

## 2020-09-18 ENCOUNTER — Other Ambulatory Visit: Payer: Self-pay | Admitting: Internal Medicine

## 2020-09-18 ENCOUNTER — Other Ambulatory Visit: Payer: Self-pay | Admitting: Cardiology

## 2020-09-21 NOTE — Progress Notes (Signed)
Cardiology Office Note:    Date:  09/24/2020   ID:  Bruce Mann, DOB 1931/06/11, MRN [id number]  PCP:  Gaspar Garbe, MD  Cardiologist:  Peter Swaziland, MD  Electrophysiologist:  None   Referring MD: Gaspar Garbe, MD   Chief Complaint  Patient presents with  . Atrial Fibrillation  . Coronary Artery Disease  . Hypertension    History of Present Illness:    Bruce Mann is a 87 y.o. male with a hx of CAD, diastolic heart failure, and permanent atrial fibrillation on low-dose Xarelto adjusted for age and CKD. He had angioplasty of OM1 in 2001.  He had abnormal stress test in 2009 that led to another cardiac catheterization.  This demonstrated three-vessel disease with normal EF.  He was managed medically.  He was admitted in October 2017 with acute diastolic heart failure and left pleural effusion.  Echocardiogram at the time showed normal EF, biatrial enlargement, mild pulmonary hypertension.  He was diuresed and the discharged on carvedilol, ARB and diuretic.  This initially led to orthostatic hypotension, medication was later adjusted with improvement.    Heart rate is very well controlled on the low-dose carvedilol.  He is compliant with Xarelto.    He reports he has been inactive. Mostly watches TV. Has been out of his losartan for the past week. Not monitoring BP at home. Denies any chest pain, dyspnea, palpitations. No dizziness.    Past Medical History:  Diagnosis Date  . Anemia   . Atrial fibrillation (HCC) 2014  . CAD (coronary artery disease)   . CHF (congestive heart failure) (HCC) 03/2016   NEW ONSET  . Diabetes mellitus type II 2005  . Dyslipidemia   . Erectile dysfunction   . Gait instability   . Hiatal hernia   . Hyperlipidemia   . Hypertension   . Shingles 05/2017  . Weakness 03/2016    Past Surgical History:  Procedure Laterality Date  . CARDIAC CATHETERIZATION     Ejection Fraction 60%  . CATARACT EXTRACTION, BILATERAL    . COLONOSCOPY     . TONSILLECTOMY      Current Medications: Current Meds  Medication Sig  . Acetaminophen (TYLENOL 8 HOUR PO) Take 2 tablets by mouth as needed.  Marland Kitchen atorvastatin (LIPITOR) 80 MG tablet Take 1 tablet (80 mg total) by mouth daily.  . calcium carbonate (TUMS - DOSED IN MG ELEMENTAL CALCIUM) 500 MG chewable tablet Chew 1 tablet by mouth daily as needed for indigestion or heartburn.  . Calcium Carbonate-Vitamin D (CALCIUM PLUS VITAMIN D PO) Take 1 tablet by mouth daily.   . carvedilol (COREG) 3.125 MG tablet Take 1 tablet (3.125 mg total) by mouth 2 (two) times daily with a meal.  . diphenhydrAMINE (BENADRYL) 25 MG tablet Take 25 mg by mouth at bedtime.  [name] Calcium (STOOL SOFTENER PO) Take 1-3 Doses by mouth daily.  Marland Kitchen donepezil (ARICEPT) 10 MG tablet Take 1 tablet (10 mg total) by mouth at bedtime. Please call us to increase this dose after a 3 month period.  . famotidine (PEPCID) 20 MG tablet Take 20 mg by mouth 2 (two) times daily.  . metFORMIN (GLUCOPHAGE) 500 MG tablet Take 1 tablet (500 mg total) by mouth 2 (two) times daily with a meal.  . Multiple Vitamin (MULTIVITAMIN) tablet Take 1 tablet by mouth daily.  Marland Kitchen MYRBETRIQ 25 MG TB24 tablet Take 25 mg by mouth daily.  Marland Kitchen NITROSTAT 0.4 MG SL tablet DISSOLVE ONE TABLET UNDER  THE TONGUE EVERY 5 MINUTES AS NEEDED FOR CHEST PAIN.  DO NOT EXCEED A TOTAL OF 3 DOSES IN 15 MINUTES  . Omega-3 Fatty Acids (OMEGA 3 500 PO) Take 1 capsule by mouth daily.  Marland Kitchen oxybutynin (DITROPAN-XL) 5 MG 24 hr tablet Take 5 mg by mouth 2 (two) times daily.  . Rivaroxaban (XARELTO) 15 MG TABS tablet Take 1 tablet (15 mg total) by mouth daily with supper.  . [DISCONTINUED] Choline Fenofibrate (FENOFIBRIC ACID) 135 MG CPDR Take 1 capsule by mouth once daily  . [DISCONTINUED] furosemide (LASIX) 40 MG tablet Take 1/2 (one-half) tablet by mouth once daily  . [DISCONTINUED] losartan (COZAAR) 25 MG tablet Patient must attend future appointment for future refills. *1st  attempt*. TAKE 1/2 (ONE-HALF) TABLET BY MOUTH ONCE DAILY AT BEDTIME  . [DISCONTINUED] potassium chloride SA (KLOR-CON) 20 MEQ tablet Take 1 tablet by mouth once daily  . [DISCONTINUED] pseudoephedrine (SUDAFED) 60 MG tablet Take 60 mg by mouth every 4 (four) hours as needed for congestion.     Allergies:   Patient has no known allergies.   Social History   Socioeconomic History  . Marital status: Married    Spouse name: [name]  . Number of children: 4  . Years of education: Not on file  . Highest education level: Not on file  Occupational History  . Occupation: Naval architect  Tobacco Use  . Smoking status: Never Smoker  . Smokeless tobacco: Never Used  Vaping Use  . Vaping Use: Never used  Substance and Sexual Activity  . Alcohol use: Yes    Comment: Occasional glass of wine  . Drug use: No  . Sexual activity: Not on file  Other Topics Concern  . Not on file  Social History Narrative  . Not on file   Social Determinants of Health   Financial Resource Strain: Not on file  Food Insecurity: Not on file  Transportation Needs: Not on file  Physical Activity: Not on file  Stress: Not on file  Social Connections: Not on file     Family History: The patient's family history includes Heart attack in his brother and father; Neuropathy in his brother; Stroke in his mother. There is no history of Colon cancer.  ROS:   Please see the history of present illness.     All other systems reviewed and are negative.  EKGs/Labs/Other Studies Reviewed:    The following studies were reviewed today:  Echo 04/17/2016 LV EF: 55% -   60% Study Conclusions  - Left ventricle: The cavity size was normal. Wall thickness was   increased in a pattern of moderate LVH. Systolic function was   normal. The estimated ejection fraction was in the range of 55%   to 60%. Wall motion was normal; there were no regional wall   motion abnormalities. - Mitral valve: Moderately calcified  annulus. - Left atrium: The atrium was mildly dilated. - Right ventricle: The cavity size was mildly dilated. - Right atrium: The atrium was severely dilated. - Pulmonary arteries: Systolic pressure was moderately increased.   PA peak pressure: 44 mm Hg (S). - Pericardium, extracardiac: There was a left pleural effusion.  EKG:  EKG is ordered today.  The ekg ordered today demonstrates atrial fibrillation, rate controlled heart rate 57, otherwise no significant ST-T wave changes. I have personally reviewed and interpreted this study.   Recent Labs: No results found for requested labs within last 8760 hours.  Recent Lipid Panel No results found for: CHOL,  TRIG, HDL, CHOLHDL, VLDL, LDLCALC, LDLDIRECT   Dated 10/18/19: creatinine 1.3. otherwise CMET and CBC normal. Dated 03/02/20: cholesterol 96, triglycerides 68, HDL 35, LDL 47.  Dated 06/13/20: A1c 5.6%  Physical Exam:    VS:  BP (!) 170/78 (BP Location: Left Arm, Patient Position: Sitting)   Pulse (!) 57   Ht 5\' 7"  (1.702 m)   Wt 149 lb 3.2 oz (67.7 kg)   SpO2 98%   BMI 23.37 kg/m     Wt Readings from Last 3 Encounters:  09/24/20 149 lb 3.2 oz (67.7 kg)  08/01/20 146 lb (66.2 kg)  01/30/20 146 lb (66.2 kg)     GEN:  Well nourished, well developed in no acute distress HEENT: Normal NECK: No JVD; No carotid bruits LYMPHATICS: No lymphadenopathy CARDIAC: Irregularly irregular, no murmurs, rubs, gallops RESPIRATORY:  Clear to auscultation without rales, wheezing or rhonchi  ABDOMEN: Soft, non-tender, non-distended MUSCULOSKELETAL:  No edema; No deformity  SKIN: Warm and dry NEUROLOGIC:  Alert and oriented x 3 PSYCHIATRIC:  Normal affect     ASSESSMENT:    1. Permanent atrial fibrillation (HCC)   2. Chronic diastolic heart failure (HCC)   3. Coronary artery disease involving native coronary artery of native heart without angina pectoris   4. Hyperlipidemia LDL goal <70    PLAN:    In order of problems listed  above:  1. Permanent atrial fibrillation: On carvedilol and Xarelto.  Rate controlled. Asymptomatic.  2. CAD: Denies any obvious anginal symptoms.  Continue Lipitor.  Not on aspirin given the need for Xarelto.  3. Chronic diastolic heart failure:  appears to be euvolemic.  4. Hyperlipidemia: Last lipid panel obtained in September showed very well-controlled cholesterol.  5.   HTN. BP is high today but has been out of losartan. Will resume 25 mg daily. Monitor BP at home.    Medication Adjustments/Labs and Tests Ordered: Current medicines are reviewed at length with the patient today.  Concerns regarding medicines are outlined above.  Orders Placed This Encounter  Procedures  . EKG 12-Lead   Meds ordered this encounter  Medications  . losartan (COZAAR) 25 MG tablet    Sig: Take 1 tablet (25 mg total) by mouth daily. Patient must attend future appointment for future refills. *1st attempt*. TAKE 1/2 (ONE-HALF) TABLET BY MOUTH ONCE DAILY AT BEDTIME    Dispense:  90 tablet    Refill:  3  . Choline Fenofibrate (FENOFIBRIC ACID) 135 MG CPDR    Sig: Take 1 capsule by mouth daily.    Dispense:  90 capsule    Refill:  3  . potassium chloride SA (KLOR-CON) 20 MEQ tablet    Sig: Take 1 tablet (20 mEq total) by mouth daily.    Dispense:  90 tablet    Refill:  3    Please keep upcoming appt for future refills  . furosemide (LASIX) 40 MG tablet    Sig: Take 0.5 tablets (20 mg total) by mouth daily.    Dispense:  45 tablet    Refill:  3    Patient Instructions  Resume losartan at 25 mg daily.  Monitor BP at home let October know if it stays high after a couple of weeks.    Signed, Peter Korea, MD  09/24/2020 11:44 AM    [facility]

## 2020-09-24 ENCOUNTER — Encounter: Payer: Self-pay | Admitting: Cardiology

## 2020-09-24 ENCOUNTER — Other Ambulatory Visit: Payer: Self-pay

## 2020-09-24 ENCOUNTER — Ambulatory Visit: Payer: Medicare Other | Admitting: Cardiology

## 2020-09-24 VITALS — BP 170/78 | HR 57 | Ht 67.0 in | Wt 149.2 lb

## 2020-09-24 DIAGNOSIS — I4821 Permanent atrial fibrillation: Secondary | ICD-10-CM

## 2020-09-24 DIAGNOSIS — E785 Hyperlipidemia, unspecified: Secondary | ICD-10-CM | POA: Diagnosis not present

## 2020-09-24 DIAGNOSIS — I2510 Atherosclerotic heart disease of native coronary artery without angina pectoris: Secondary | ICD-10-CM

## 2020-09-24 DIAGNOSIS — I5032 Chronic diastolic (congestive) heart failure: Secondary | ICD-10-CM

## 2020-09-24 MED ORDER — LOSARTAN POTASSIUM 25 MG PO TABS: 25.0000 mg | ORAL_TABLET | Freq: Every day | ORAL | 3 refills | Status: DC

## 2020-09-24 MED ORDER — POTASSIUM CHLORIDE CRYS ER 20 MEQ PO TBCR: 20.0000 meq | EXTENDED_RELEASE_TABLET | Freq: Every day | ORAL | 3 refills | Status: DC

## 2020-09-24 MED ORDER — FENOFIBRIC ACID 135 MG PO CPDR: 1.0000 | DELAYED_RELEASE_CAPSULE | Freq: Every day | ORAL | 3 refills | Status: DC

## 2020-09-24 MED ORDER — FUROSEMIDE 40 MG PO TABS: 20.0000 mg | ORAL_TABLET | Freq: Every day | ORAL | 3 refills | Status: DC

## 2020-09-24 NOTE — Patient Instructions (Signed)
Resume losartan at 25 mg daily.  Monitor BP at home let us know if it stays high after a couple of weeks.

## 2020-09-25 ENCOUNTER — Other Ambulatory Visit: Payer: Self-pay

## 2020-11-12 ENCOUNTER — Ambulatory Visit: Payer: Medicare Other | Admitting: Family Medicine

## 2020-11-12 ENCOUNTER — Other Ambulatory Visit: Payer: Self-pay

## 2020-11-12 ENCOUNTER — Encounter: Payer: Self-pay | Admitting: Family Medicine

## 2020-11-12 VITALS — BP 102/56 | HR 66 | Ht 67.0 in | Wt 143.0 lb

## 2020-11-12 DIAGNOSIS — F0390 Unspecified dementia without behavioral disturbance: Secondary | ICD-10-CM

## 2020-11-12 MED ORDER — DONEPEZIL HCL 10 MG PO TABS: 10.0000 mg | ORAL_TABLET | Freq: Every day | ORAL | 1 refills | Status: DC

## 2020-11-12 MED ORDER — MEMANTINE HCL 10 MG PO TABS: 10.0000 mg | ORAL_TABLET | Freq: Two times a day (BID) | ORAL | 1 refills | Status: DC

## 2020-11-12 NOTE — Patient Instructions (Signed)
Below is our plan:  We will continue donepezil 10mg  daily. We will increase memantine to 10mg  twice daily.   Please make sure you are staying well hydrated. I recommend 50-60 ounces daily. Well balanced diet and regular exercise encouraged. Consistent sleep schedule with 6-8 hours recommended.   Please continue follow up with care team as directed.   Follow up with me in 6 months   You may receive a survey regarding today's visit. I encourage you to leave honest feed back as I do use this information to improve patient care. Thank you for seeing me today!    Management of Memory Problems   There are some general things you can do to help manage your memory problems.  Your memory may not in fact recover, but by using techniques and strategies you will be able to manage your memory difficulties better.   1)  Establish a routine. ? Try to establish and then stick to a regular routine.  By doing this, you will get used to what to expect and you will reduce the need to rely on your memory.  Also, try to do things at the same time of day, such as taking your medication or checking your calendar first thing in the morning. ? Think about think that you can do as a part of a regular routine and make a list.  Then enter them into a daily planner to remind you.  This will help you establish a routine.   2)  Organize your environment. ? Organize your environment so that it is uncluttered.  Decrease visual stimulation.  Place everyday items such as keys or cell phone in the same place every day (ie.  Basket next to front door) ? Use post it notes with a brief message to yourself (ie. Turn off light, lock the door) ? Use labels to indicate where things go (ie. Which cupboards are for food, dishes, etc.) ? Keep a notepad and pen by the telephone to take messages   3)  Memory Aids ? A diary or journal/notebook/daily planner ? Making a list (shopping list, chore list, to do list that needs to be  done) ? Using an alarm as a reminder (kitchen timer or cell phone alarm) ? Using cell phone to store information (Notes, Calendar, Reminders) ? Calendar/White board placed in a prominent position ? Post-it notes   In order for memory aids to be useful, you need to have good habits.  It's no good remembering to make a note in your journal if you don't remember to look in it.  Try setting aside a certain time of day to look in journal.   4)  Improving mood and managing fatigue. 1. There may be other factors that contribute to memory difficulties.  Factors, such as anxiety, depression and tiredness can affect memory.  Regular gentle exercise can help improve your mood and give you more energy.  Simple relaxation techniques may help relieve symptoms of anxiety  Try to get back to completing activities or hobbies you enjoyed doing in the past.  Learn to pace yourself through activities to decrease fatigue.  Find out about some local support groups where you can share experiences with others.  Try and achieve 7-8 hours of sleep at night.   Dementia Dementia is a condition that affects the way the brain works. It often affects memory and thinking. There are many types of dementia. Some types get worse with time and cannot be reversed. Some types of  dementia include:  Alzheimer's disease. This is the most common type.  Vascular dementia. This type may happen due to a stroke.  Lewy body dementia. This type may happen to people who have Parkinson's disease.  Frontotemporal dementia. This type is caused by damage to nerve cells in certain parts of the brain. Some people may have more than one type. What are the causes? This condition is caused by damage to cells in the brain. Some causes that cannot be reversed include:  Having a condition that affects the blood vessels of the brain, such as diabetes, heart disease, or blood vessel disease.  Changes to genes. Some causes that can be  reversed or slowed include:  Injury to the brain.  Certain medicines.  Infection.  Not having enough vitamin B12 in the body, or thyroid problems.  A tumor, blood clot, or too much fluid in the brain.  Certain diseases that cause your body's defense system (immune system) to attack healthy parts of the body. What are the signs or symptoms?  Problems remembering events or people.  Having trouble taking a bath or putting clothes on.  Forgetting appointments.  Forgetting to pay bills.  Trouble planning and making meals.  Having trouble speaking.  Getting lost easily.  Changes in behavior or mood. How is this treated? Treatment depends on the cause of the dementia. It might include:  Taking medicines for symptoms or to help control or slow down the dementia.  Treating the cause of your dementia. Your doctor can help you find support groups and other doctors who can help with your care. Follow these instructions at home: Medicines  Take over-the-counter and prescription medicines only as told by your doctor.  Use a pill organizer to help you manage your medicines.  Avoidtaking medicines for pain or for sleep. Lifestyle  Make healthy choices: ? Be active as told by your doctor. ? Do not smoke or use any products that contain nicotine or tobacco. If you need help quitting, ask your doctor. ? Do not drink alcohol. ? When you get stressed, do something that will help you relax. Your doctor can give you tips. ? Spend time with other people.  Make sure you get good sleep. To get good sleep: ? Try not to take naps during the day. ? Keep your bedroom dark and cool. ? In the few hours before you go to bed, try not to do any exercise. ? Do not have foods and drinks with caffeine at night. Eating and drinking  Drink enough fluid to keep your pee (urine) pale yellow.  Eat a healthy diet. General instructions  Talk with your doctor to figure out: ? What you need help  with. ? What your safety needs are.  Ask your doctor if it is safe for you to drive.  If told, wear a bracelet that tracks where you are or shows that you are a person with memory loss.  Work with your family to make big decisions.  Keep all follow-up visits.   Where to find more information  Alzheimer's Association: LimitLaws.hu  [employer] on Aging: CashCowGambling.be  World Health Organization: https://castaneda-walker.com/ Contact a doctor if:  You have any new symptoms.  Your symptoms get worse.  You have problems with swallowing or choking. Get help right away if:  You feel very sad, or feel that you want to harm yourself.  Your family members are worried for your safety. Get help right away if you feel like you may hurt  yourself or others, or have thoughts about taking your own life. Go to your nearest emergency room or:  Call your local emergency services (911 in the [place]  Call the National Suicide Prevention Lifeline at 470-726-5794. This is open 24 hours a day.  Text the Crisis Text Line at 901-442-0834. Summary  Dementia often affects memory and thinking.  Some types of dementia get worse with time and cannot be reversed.  Treatment for this condition depends on the cause.  Talk with your doctor to figure out what you need help with.  Your doctor can help you find support groups and other doctors who can help with your care. This information is not intended to replace advice given to you by your health care provider. Make sure you discuss any questions you have with your health care provider. Document Revised: 10/31/2019 Document Reviewed: 10/31/2019 Elsevier Patient Education  2021 [employer].

## 2020-11-12 NOTE — Progress Notes (Signed)
Chief Complaint  Patient presents with  . Follow-up    Rm 1, w/ wife Bruce Mann, has been tolerating Namenda mg BID with no side effects. Pts wife states she has not noticed any improvement wit his memory. Refills needed.      HISTORY OF PRESENT ILLNESS: 11/12/20 ALL: Bruce Mann returns for dementia follow up. We continues Aricept 10mg  daily and added Namenda 5mg  BID at last visit 07/2020. He has tolerated it well. Mrs Bruce Mann isn't sure if it has helped much. Memory seems to wax and wane. He continues to be independent with ADLs. He does not drive. He is eating normally. He is sleeping more but seems to rest better.  He is not very active. He did start PT on his own accord for strength training. He is going 2-3 times a week. He does feel this has been helpful. He denies falls. He is using a walker. He is meeting with his friends for coffee regularly.    08/01/2020 ALL:  Bruce Mann is a 87 y.o. male here today for follow up for dementia. He was started on Aricept 5mg  in 01/2020 and increased dose to 10mg  in 04/2020. He has tolerated medication fairly well. Uncertain if they have noticed any improvements. He continues to have difficulty with short term memory is very repetitive in questioning. He is doing well, otherwise. He is considering going back to the gym to participate in a senior exercise program. He is not driving at this time due to some pain concerns. He continues to perform ADL's independently. He is walking without difficulty.    HISTORY (copied from Dr Dohmeier's previous note)  Bruce Sciaraommy L Teagueis a 87 year old Caucasian male patientseen here as a referralon 01/30/2020 . Chiefconcernaccording to patient : memory loss -  I have the pleasure of seeing Bruce Mann today,a right-handed White or Caucasian male with a possible dementia  disorder. he has a past medical history of Anemia, Atrial fibrillation (HCC) (2014), CAD (coronary artery disease), CHF (congestive heart failure)  (HCC) (03/2016), Diabetes mellitus type II (2005), Dyslipidemia,Hiatal hernia, Hyperlipidemia, Hypertension, Shingles (05/2017) on the right upper back. Familymedical history:No other family member with memory loss. Brother died in April and had few memory issues at age 989.   Social history:Patient is a [employer], he retired from [employer] sales, parts-  and  Is a Acupuncturistlandlord. He lives in a household with 2 persons. Family status is married with 4 children, 5 grandchildren.  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  Pets are not present. Tobacco use-  none.  ETOH use : seldomly,  Caffeine intake in form of Coffee( 1 cup decaffeinated ) Soda( social ) Tea ( /) or energy drinks.  Patient has noted difficulties to answer questions, is unsure about number of grandchildren. Twice a week in the gym. He does drive in town, not highway.  He has had days when he felt unable to imaging to plan the route to a location. [place] resident.     REVIEW OF SYSTEMS: Out of a complete 14 system review of symptoms, the patient complains only of the following symptoms, memory loss, bursitis and all other reviewed systems are negative.    ALLERGIES: No Known Allergies   HOME MEDICATIONS: Outpatient Medications Prior to Visit  Medication Sig Dispense Refill  . Acetaminophen (TYLENOL 8 HOUR PO) Take 2 tablets by mouth as needed.    Marland Kitchen. atorvastatin (LIPITOR) 80 MG tablet Take 1 tablet (  80 mg total) by mouth daily. 90 tablet 3  . calcium carbonate (TUMS - DOSED IN MG ELEMENTAL CALCIUM) 500 MG chewable tablet Chew 1 tablet by mouth daily as needed for indigestion or heartburn.    . Calcium Carbonate-Vitamin D (CALCIUM PLUS VITAMIN D PO) Take 1 tablet by mouth daily.     . carvedilol (COREG) 3.125 MG tablet Take 1 tablet (3.125 mg total) by mouth 2 (two) times daily with a meal. 180 tablet 3  . Choline Fenofibrate (FENOFIBRIC ACID) 135 MG CPDR Take 1 capsule by mouth  daily. 90 capsule 3  . diphenhydrAMINE (BENADRYL) 25 MG tablet Take 25 mg by mouth at bedtime.    [name] Calcium (STOOL SOFTENER PO) Take 1-3 Doses by mouth daily.    Marland Kitchen donepezil (ARICEPT) 10 MG tablet Take 1 tablet (10 mg total) by mouth at bedtime. Please call us to increase this dose after a 3 month period. 90 tablet 1  . famotidine (PEPCID) 20 MG tablet Take 20 mg by mouth 2 (two) times daily.    . furosemide (LASIX) 40 MG tablet Take 0.5 tablets (20 mg total) by mouth daily. 45 tablet 3  . losartan (COZAAR) 25 MG tablet Take 1 tablet (25 mg total) by mouth daily. 90 tablet 3  . memantine (NAMENDA) 5 MG tablet Take 1 tablet (5 mg total) by mouth 2 (two) times daily. 180 tablet 0  . metFORMIN (GLUCOPHAGE) 500 MG tablet Take 1 tablet (500 mg total) by mouth 2 (two) times daily with a meal.    . Multiple Vitamin (MULTIVITAMIN) tablet Take 1 tablet by mouth daily.    Marland Kitchen MYRBETRIQ 25 MG TB24 tablet Take 25 mg by mouth daily.    Marland Kitchen NITROSTAT 0.4 MG SL tablet DISSOLVE ONE TABLET UNDER THE TONGUE EVERY 5 MINUTES AS NEEDED FOR CHEST PAIN.  DO NOT EXCEED A TOTAL OF 3 DOSES IN 15 MINUTES 25 tablet 11  . Omega-3 Fatty Acids (OMEGA 3 500 PO) Take 1 capsule by mouth daily.    Marland Kitchen oxybutynin (DITROPAN-XL) 5 MG 24 hr tablet Take 5 mg by mouth 2 (two) times daily.    . potassium chloride SA (KLOR-CON) 20 MEQ tablet Take 1 tablet (20 mEq total) by mouth daily. 90 tablet 3  . Rivaroxaban (XARELTO) 15 MG TABS tablet Take 1 tablet (15 mg total) by mouth daily with supper. 90 tablet 3   No facility-administered medications prior to visit.     PAST MEDICAL HISTORY: Past Medical History:  Diagnosis Date  . Anemia   . Atrial fibrillation (HCC) 2014  . CAD (coronary artery disease)   . CHF (congestive heart failure) (HCC) 03/2016   NEW ONSET  . Diabetes mellitus type II 2005  . Dyslipidemia   . Erectile dysfunction   . Gait instability   . Hiatal hernia   . Hyperlipidemia   . Hypertension   . Shingles  05/2017  . Weakness 03/2016     PAST SURGICAL HISTORY: Past Surgical History:  Procedure Laterality Date  . CARDIAC CATHETERIZATION     Ejection Fraction 60%  . CATARACT EXTRACTION, BILATERAL    . COLONOSCOPY    . TONSILLECTOMY       FAMILY HISTORY: Family History  Problem Relation Age of Onset  . Stroke Mother   . Heart attack Father   . Heart attack Brother   . Neuropathy Brother   . Colon cancer Neg Hx      SOCIAL HISTORY: Social History   Socioeconomic  History  . Marital status: Married    Spouse name: Bruce Sine  . Number of children: 4  . Years of education: Not on file  . Highest education level: Not on file  Occupational History  . Occupation: Naval architect  Tobacco Use  . Smoking status: Never Smoker  . Smokeless tobacco: Never Used  Vaping Use  . Vaping Use: Never used  Substance and Sexual Activity  . Alcohol use: Yes    Comment: Occasional glass of wine  . Drug use: No  . Sexual activity: Not on file  Other Topics Concern  . Not on file  Social History Narrative  . Not on file   Social Determinants of Health   Financial Resource Strain: Not on file  Food Insecurity: Not on file  Transportation Needs: Not on file  Physical Activity: Not on file  Stress: Not on file  Social Connections: Not on file  Intimate Partner Violence: Not on file      PHYSICAL EXAM  Vitals:   11/12/20 1304  BP: (!) 102/56  Pulse: 66  Weight: 143 lb (64.9 kg)  Height: 5\' 7"  (1.702 m)   Body mass index is 22.4 kg/m.   Generalized: Well developed, in no acute distress  Cardiology: normal rate and rhythm, no murmur auscultated  Respiratory: clear to auscultation bilaterally    Neurological examination  Mentation: Alert oriented to time, place, history taking. Follows all commands speech and language fluent Cranial nerve II-XII: Pupils were equal round reactive to light. Extraocular movements were full, visual field were full on confrontational test.  Facial sensation and strength were normal. Head turning and shoulder shrug  were normal and symmetric. Motor: The motor testing reveals 5 over 5 strength of all 4 extremities. Good symmetric motor tone is noted throughout.  Sensory: Sensory testing is intact to soft touch on all 4 extremities. No evidence of extinction is noted.  Coordination: Cerebellar testing reveals good finger-nose-finger and heel-to-shin bilaterally.  Gait and station: Gait is stable without assistive device. He does have a slightly stooped posture today, could be related to pain.     DIAGNOSTIC DATA (LABS, IMAGING, TESTING) - I reviewed patient records, labs, notes, testing and imaging myself where available.  Lab Results  Component Value Date   WBC 8.9 03/15/2018   HGB 12.5 (L) 03/15/2018   HCT 38.3 (L) 03/15/2018   MCV 94.6 03/15/2018   PLT 137 (L) 03/15/2018      Component Value Date/Time   NA 133 (L) 04/25/2016 1143   K 4.6 04/25/2016 1143   CL 98 04/25/2016 1143   CO2 26 04/25/2016 1143   GLUCOSE 119 (H) 04/25/2016 1143   BUN 36 (H) 04/25/2016 1143   CREATININE 1.43 (H) 04/25/2016 1143   CALCIUM 9.7 04/25/2016 1143   PROT 5.6 (L) 04/17/2016 0420   ALBUMIN 2.8 (L) 04/17/2016 0420   AST 133 (H) 04/17/2016 0420   ALT 75 (H) 04/17/2016 0420   ALKPHOS 34 (L) 04/17/2016 0420   BILITOT 1.3 (H) 04/17/2016 0420   GFRNONAA 45 (L) 04/25/2016 1143   GFRAA 52 (L) 04/25/2016 1143   No results found for: CHOL, HDL, LDLCALC, LDLDIRECT, TRIG, CHOLHDL No results found for: 04/27/2016 Lab Results  Component Value Date   VITAMINB12 583 04/17/2016   Lab Results  Component Value Date   TSH 5.557 (H) 04/16/2016    MMSE - Mini Mental State Exam 11/12/2020 08/01/2020 01/30/2020  Orientation to time 1 4 4   Orientation to Place 3 3  3  Registration 0 3 3  Attention/ Calculation 0 0 1  Recall 0 0 0  Language- name 2 objects 2 2 2   Language- repeat 1 0 1  Language- follow 3 step command 2 3 3   Language- read & follow  direction 1 1 1   Write a sentence 1 1 1   Copy design 1 0 1  Total score 12 17 20      No flowsheet data found.   ASSESSMENT AND PLAN  87 y.o. year old male  has a past medical history of Anemia, Atrial fibrillation (HCC) (2014), CAD (coronary artery disease), CHF (congestive heart failure) (HCC) (03/2016), Diabetes mellitus type II (2005), Dyslipidemia, Erectile dysfunction, Gait instability, Hiatal hernia, Hyperlipidemia, Hypertension, Shingles (05/2017), and Weakness (03/2016). here with   Dementia without behavioral disturbance, unspecified dementia type [facility])  Bruce Mann is doing fairly well, today.  He has tolerated Aricept 10 mg daily and Namenda 5mg  BID. We will continue Aricept and increase Namenda to 10mg  BID. He will continue healthy lifestyle habits. Memory compensation strategies reviewed. I have encouraged him to continue regular physical and mental activity.  He will continue close follow-up with primary care.  I will have him follow-up with me in 6 months.  He verbalizes understanding and agreement with this plan.  No orders of the defined types were placed in this encounter.    No orders of the defined types were placed in this encounter.    05-10-1975, MSN, FNP-C 11/12/2020, 1:22 PM  [facility] [address] [place], Bruce Potter [phone]

## 2020-11-25 ENCOUNTER — Other Ambulatory Visit: Payer: Self-pay | Admitting: Cardiology

## 2021-02-09 ENCOUNTER — Emergency Department (HOSPITAL_COMMUNITY): Payer: Medicare Other

## 2021-02-09 ENCOUNTER — Other Ambulatory Visit: Payer: Self-pay

## 2021-02-09 ENCOUNTER — Emergency Department (HOSPITAL_COMMUNITY)
Admission: EM | Admit: 2021-02-09 | Discharge: 2021-02-09 | Disposition: A | Discharge status: Home / Self Care | Payer: Medicare Other | Attending: Emergency Medicine | Admitting: Emergency Medicine

## 2021-02-09 ENCOUNTER — Encounter (HOSPITAL_COMMUNITY): Payer: Self-pay

## 2021-02-09 DIAGNOSIS — F0390 Unspecified dementia without behavioral disturbance: Secondary | ICD-10-CM | POA: Diagnosis not present

## 2021-02-09 DIAGNOSIS — Z23 Encounter for immunization: Secondary | ICD-10-CM | POA: Insufficient documentation

## 2021-02-09 DIAGNOSIS — W182XXA Fall in (into) shower or empty bathtub, initial encounter: Secondary | ICD-10-CM | POA: Diagnosis not present

## 2021-02-09 DIAGNOSIS — R2990 Unspecified symptoms and signs involving the nervous system: Secondary | ICD-10-CM

## 2021-02-09 DIAGNOSIS — I2510 Atherosclerotic heart disease of native coronary artery without angina pectoris: Secondary | ICD-10-CM | POA: Diagnosis not present

## 2021-02-09 DIAGNOSIS — Z7901 Long term (current) use of anticoagulants: Secondary | ICD-10-CM | POA: Diagnosis not present

## 2021-02-09 DIAGNOSIS — Z79899 Other long term (current) drug therapy: Secondary | ICD-10-CM | POA: Diagnosis not present

## 2021-02-09 DIAGNOSIS — Y92002 Bathroom of unspecified non-institutional (private) residence single-family (private) house as the place of occurrence of the external cause: Secondary | ICD-10-CM | POA: Insufficient documentation

## 2021-02-09 DIAGNOSIS — I5032 Chronic diastolic (congestive) heart failure: Secondary | ICD-10-CM | POA: Diagnosis not present

## 2021-02-09 DIAGNOSIS — S0011XA Contusion of right eyelid and periocular area, initial encounter: Secondary | ICD-10-CM | POA: Insufficient documentation

## 2021-02-09 DIAGNOSIS — I110 Hypertensive heart disease with heart failure: Secondary | ICD-10-CM | POA: Insufficient documentation

## 2021-02-09 DIAGNOSIS — E119 Type 2 diabetes mellitus without complications: Secondary | ICD-10-CM | POA: Insufficient documentation

## 2021-02-09 DIAGNOSIS — R531 Weakness: Secondary | ICD-10-CM | POA: Diagnosis not present

## 2021-02-09 DIAGNOSIS — Z7984 Long term (current) use of oral hypoglycemic drugs: Secondary | ICD-10-CM | POA: Diagnosis not present

## 2021-02-09 DIAGNOSIS — S63282A Dislocation of proximal interphalangeal joint of right middle finger, initial encounter: Secondary | ICD-10-CM | POA: Insufficient documentation

## 2021-02-09 DIAGNOSIS — I4891 Unspecified atrial fibrillation: Secondary | ICD-10-CM | POA: Diagnosis not present

## 2021-02-09 DIAGNOSIS — S00201A Unspecified superficial injury of right eyelid and periocular area, initial encounter: Secondary | ICD-10-CM | POA: Diagnosis present

## 2021-02-09 DIAGNOSIS — T1490XA Injury, unspecified, initial encounter: Secondary | ICD-10-CM

## 2021-02-09 LAB — COMPREHENSIVE METABOLIC PANEL
ALT: 30 U/L (ref 0–44)
AST: 50 U/L — ABNORMAL HIGH (ref 15–41)
Albumin: 3.5 g/dL (ref 3.5–5.0)
Alkaline Phosphatase: 42 U/L (ref 38–126)
Anion gap: 8 (ref 5–15)
BUN: 31 mg/dL — ABNORMAL HIGH (ref 8–23)
CO2: 27 mmol/L (ref 22–32)
Calcium: 9.8 mg/dL (ref 8.9–10.3)
Chloride: 104 mmol/L (ref 98–111)
Creatinine, Ser: 1.23 mg/dL (ref 0.61–1.24)
GFR, Estimated: 57 mL/min — ABNORMAL LOW (ref >60)
Glucose, Bld: 121 mg/dL — ABNORMAL HIGH (ref 70–99)
Potassium: 4.4 mmol/L (ref 3.5–5.1)
Sodium: 139 mmol/L (ref 135–145)
Total Bilirubin: 0.8 mg/dL (ref 0.3–1.2)
Total Protein: 6.5 g/dL (ref 6.5–8.1)

## 2021-02-09 LAB — I-STAT CHEM 8, ED
BUN: 37 mg/dL — ABNORMAL HIGH (ref 8–23)
Calcium, Ion: 1.19 mmol/L (ref 1.15–1.40)
Chloride: 105 mmol/L (ref 98–111)
Creatinine, Ser: 1.3 mg/dL — ABNORMAL HIGH (ref 0.61–1.24)
Glucose, Bld: 118 mg/dL — ABNORMAL HIGH (ref 70–99)
HCT: 37 % — ABNORMAL LOW (ref 39.0–52.0)
Hemoglobin: 12.6 g/dL — ABNORMAL LOW (ref 13.0–17.0)
Potassium: 4.4 mmol/L (ref 3.5–5.1)
Sodium: 141 mmol/L (ref 135–145)
TCO2: 29 mmol/L (ref 22–32)

## 2021-02-09 LAB — CBG MONITORING, ED: Glucose-Capillary: 113 mg/dL — ABNORMAL HIGH (ref 70–99)

## 2021-02-09 LAB — DIFFERENTIAL
Abs Immature Granulocytes: 0.02 10*3/uL (ref 0.00–0.07)
Basophils Absolute: 0 10*3/uL (ref 0.0–0.1)
Basophils Relative: 1 %
Eosinophils Absolute: 0.2 10*3/uL (ref 0.0–0.5)
Eosinophils Relative: 3 %
Immature Granulocytes: 0 %
Lymphocytes Relative: 21 %
Lymphs Abs: 1.4 10*3/uL (ref 0.7–4.0)
Monocytes Absolute: 0.6 10*3/uL (ref 0.1–1.0)
Monocytes Relative: 9 %
Neutro Abs: 4.4 10*3/uL (ref 1.7–7.7)
Neutrophils Relative %: 66 %

## 2021-02-09 LAB — CBC
HCT: 39 % (ref 39.0–52.0)
Hemoglobin: 12.3 g/dL — ABNORMAL LOW (ref 13.0–17.0)
MCH: 32.1 pg (ref 26.0–34.0)
MCHC: 31.5 g/dL (ref 30.0–36.0)
MCV: 101.8 fL — ABNORMAL HIGH (ref 80.0–100.0)
Platelets: 146 10*3/uL — ABNORMAL LOW (ref 150–400)
RBC: 3.83 MIL/uL — ABNORMAL LOW (ref 4.22–5.81)
RDW: 15.5 % (ref 11.5–15.5)
WBC: 6.7 10*3/uL (ref 4.0–10.5)
nRBC: 0 % (ref 0.0–0.2)

## 2021-02-09 LAB — APTT: aPTT: 31 seconds (ref 24–36)

## 2021-02-09 LAB — TYPE AND SCREEN
ABO/RH(D): B POS
Antibody Screen: NEGATIVE

## 2021-02-09 LAB — PROTIME-INR
INR: 1.5 — ABNORMAL HIGH (ref 0.8–1.2)
Prothrombin Time: 17.6 seconds — ABNORMAL HIGH (ref 11.4–15.2)

## 2021-02-09 MED ORDER — SODIUM CHLORIDE 0.9% FLUSH: 3.0000 mL | Freq: Once | INTRAVENOUS | Status: DC

## 2021-02-09 MED ORDER — TETANUS-DIPHTH-ACELL PERTUSSIS 5-2.5-18.5 LF-MCG/0.5 IM SUSY
0.5000 mL | PREFILLED_SYRINGE | Freq: Once | INTRAMUSCULAR | Status: AC
  Administered 2021-02-09: 0.5 mL via INTRAMUSCULAR
  Filled 2021-02-09: qty 0.5

## 2021-02-09 NOTE — ED Provider Notes (Signed)
[facility] EMERGENCY DEPARTMENT Provider Note   CSN: [id number] Arrival date & time: 02/09/21  1337     History Chief Complaint  Patient presents with   Bruce Mann is a 87 y.o. male.  HPI Patient presents after a fall on blood thinners.  Fall occurred while in the bathroom.  No witnesses are available to describe the fall.  He takes Xarelto at baseline.  EMS was called.  EMS had concerns of leg weakness when standing.  They noted right finger deformity, and bruising/hematoma to his right upper eyelid.  Patient denies any discomfort.  Due to EMS concerns of leg weakness, patient arrived as a code stroke.  Patient denies any recent or current symptoms.  He does have dementia at baseline.  Subsequently his wife arrived to the ED and states that he is at his baseline mental status and states that he has been well recently.    Past Medical History:  Diagnosis Date   Anemia    Atrial fibrillation (HCC) 2014   CAD (coronary artery disease)    CHF (congestive heart failure) (HCC) 03/2016   NEW ONSET   Diabetes mellitus type II 2005   Dyslipidemia    Erectile dysfunction    Gait instability    Hiatal hernia    Hyperlipidemia    Hypertension    Shingles 05/2017   Weakness 03/2016    Patient Active Problem List   Diagnosis Date Noted   Dementia without behavioral disturbance (HCC) 01/30/2020   Iron deficiency anemia 04/28/2016   Chronic diastolic CHF (congestive heart failure) (HCC) 04/16/2016   Anemia    AKI (acute kidney injury) (HCC)    Dyslipidemia    Hypertension    CAD (coronary artery disease)    Hyperlipidemia    Atrial fibrillation (HCC)     Past Surgical History:  Procedure Laterality Date   CARDIAC CATHETERIZATION     Ejection Fraction 60%   CATARACT EXTRACTION, BILATERAL     COLONOSCOPY     TONSILLECTOMY         Family History  Problem Relation Age of Onset   Stroke Mother    Heart attack Father    Heart attack Brother     Neuropathy Brother    Colon cancer Neg Hx     Social History   Tobacco Use   Smoking status: Never   Smokeless tobacco: Never  Vaping Use   Vaping Use: Never used  Substance Use Topics   Alcohol use: Yes    Comment: Occasional glass of wine   Drug use: No    Home Medications Prior to Admission medications   Medication Sig Start Date End Date Taking? Authorizing Provider  Acetaminophen (TYLENOL 8 HOUR PO) Take 2 tablets by mouth as needed.    [provider]  atorvastatin (LIPITOR) 80 MG tablet Take 1 tablet (80 mg total) by mouth daily. 03/05/17   [place], Peter M, MD  calcium carbonate (TUMS - DOSED IN MG ELEMENTAL CALCIUM) 500 MG chewable tablet Chew 1 tablet by mouth daily as needed for indigestion or heartburn.    [provider]  Calcium Carbonate-Vitamin D (CALCIUM PLUS VITAMIN D PO) Take 1 tablet by mouth daily.     [provider]  carvedilol (COREG) 3.125 MG tablet Take 1 tablet (3.125 mg total) by mouth 2 (two) times daily with a meal. 03/05/17   [place], Peter M, MD  Choline Fenofibrate (FENOFIBRIC ACID) 135 MG CPDR Take 1  capsule by mouth daily. 09/24/20   [place], Peter M, MD  diphenhydrAMINE (BENADRYL) 25 MG tablet Take 25 mg by mouth at bedtime.    [provider]  Docusate Calcium (STOOL SOFTENER PO) Take 1-3 Doses by mouth daily.    [provider]  donepezil (ARICEPT) 10 MG tablet Take 1 tablet (10 mg total) by mouth at bedtime. Please call us to increase this dose after a 3 month period. 11/12/20   Lomax, Amy, NP  famotidine (PEPCID) 20 MG tablet Take 20 mg by mouth 2 (two) times daily.    [provider]  furosemide (LASIX) 40 MG tablet Take 0.5 tablets (20 mg total) by mouth daily. 09/24/20   [place], Peter M, MD  losartan (COZAAR) 25 MG tablet Take 1 tablet (25 mg total) by mouth daily. 09/24/20 12/23/20  [place], Peter M, MD  memantine (NAMENDA) 10 MG tablet Take 1 tablet (10 mg total) by mouth 2 (two) times daily.  11/12/20   Lomax, Amy, NP  metFORMIN (GLUCOPHAGE) 500 MG tablet Take 1 tablet (500 mg total) by mouth 2 (two) times daily with a meal. 03/22/18   [place], Peter M, MD  Multiple Vitamin (MULTIVITAMIN) tablet Take 1 tablet by mouth daily.    [provider]  MYRBETRIQ 25 MG TB24 tablet Take 25 mg by mouth daily. 07/12/20   [provider]  NITROSTAT 0.4 MG SL tablet DISSOLVE ONE TABLET UNDER THE TONGUE EVERY 5 MINUTES AS NEEDED FOR CHEST PAIN.  DO NOT EXCEED A TOTAL OF 3 DOSES IN 15 MINUTES 01/05/20   [place], Peter M, MD  Omega-3 Fatty Acids (OMEGA 3 500 PO) Take 1 capsule by mouth daily.    [provider]  oxybutynin (DITROPAN-XL) 5 MG 24 hr tablet Take 5 mg by mouth 2 (two) times daily. 10/11/19   [provider]  potassium chloride SA (KLOR-CON) 20 MEQ tablet TAKE 1  BY MOUTH ONCE DAILY 11/27/20   [place], Peter M, MD  Rivaroxaban (XARELTO) 15 MG TABS tablet Take 1 tablet (15 mg total) by mouth daily with supper. 03/05/17   [place], Peter M, MD    Allergies    Patient has no known allergies.  Review of Systems   Review of Systems  Constitutional:  Negative for chills, fatigue and fever.  HENT:  Negative for ear pain and sore throat.   Eyes:  Negative for pain and visual disturbance.  Respiratory:  Negative for cough and shortness of breath.   Cardiovascular:  Negative for chest pain and palpitations.  Gastrointestinal:  Negative for abdominal pain, diarrhea, nausea and vomiting.  Genitourinary:  Negative for dysuria and hematuria.  Musculoskeletal:  Positive for arthralgias (Right middle finger) and gait problem (Per EMS.  Per wife gait is at baseline). Negative for back pain, joint swelling, myalgias and neck pain.  Skin:  Negative for color change, rash and wound.  Neurological:  Negative for dizziness, seizures, syncope, weakness, light-headedness and numbness.  Hematological:  Bruises/bleeds easily (On Xarelto).  Psychiatric/Behavioral:  Negative for confusion  (Baseline mental status (dementia present)) and decreased concentration.   All other systems reviewed and are negative.  Physical Exam Updated Vital Signs BP (!) 149/57   Pulse 60   Temp 97.8 F (36.6 C) (Oral)   Resp 20   Ht 5\' 7"  (1.702 m)   Wt 66.8 kg   SpO2 96%   BMI 23.07 kg/m   Physical Exam Vitals and nursing note reviewed.  Constitutional:      General:  He is not in acute distress.    Appearance: Normal appearance. He is well-developed. He is not ill-appearing, toxic-appearing or diaphoretic.  HENT:     Head: Normocephalic.     Right Ear: External ear normal.     Left Ear: External ear normal.     Nose: Nose normal.     Mouth/Throat:     Pharynx: Oropharynx is clear.  Eyes:     Conjunctiva/sclera: Conjunctivae normal.     Comments: Swelling and ecchymosis to right upper eyelid and brow  Neck:     Comments: Cervical collar in place Cardiovascular:     Rate and Rhythm: Normal rate.     Heart sounds: No murmur heard. Pulmonary:     Effort: Pulmonary effort is normal. No respiratory distress.     Breath sounds: Normal breath sounds. No wheezing.  Chest:     Chest wall: No tenderness.  Abdominal:     Palpations: Abdomen is soft.     Tenderness: There is no abdominal tenderness. There is no guarding.  Musculoskeletal:        General: Deformity and signs of injury present.     Cervical back: Neck supple.     Right lower leg: No edema.     Left lower leg: No edema.     Comments: Right middle finger PIP dislocation  Skin:    General: Skin is warm and dry.     Findings: Bruising (Above right eye) present.  Neurological:     General: No focal deficit present.     Mental Status: He is alert. Mental status is at baseline.     Cranial Nerves: No cranial nerve deficit.     Sensory: No sensory deficit.     Motor: No weakness.     Coordination: Coordination normal.  Psychiatric:        Mood and Affect: Mood normal.        Behavior: Behavior normal.    ED  Results / Procedures / Treatments   Labs (all labs ordered are listed, but only abnormal results are displayed) Labs Reviewed  PROTIME-INR - Abnormal; Notable for the following components:      Result Value   Prothrombin Time 17.6 (*)    INR 1.5 (*)    All other components within normal limits  CBC - Abnormal; Notable for the following components:   RBC 3.83 (*)    Hemoglobin 12.3 (*)    MCV 101.8 (*)    Platelets 146 (*)    All other components within normal limits  COMPREHENSIVE METABOLIC PANEL - Abnormal; Notable for the following components:   Glucose, Bld 121 (*)    BUN 31 (*)    AST 50 (*)    GFR, Estimated 57 (*)    All other components within normal limits  I-STAT CHEM 8, ED - Abnormal; Notable for the following components:   BUN 37 (*)    Creatinine, Ser 1.30 (*)    Glucose, Bld 118 (*)    Hemoglobin 12.6 (*)    HCT 37.0 (*)    All other components within normal limits  CBG MONITORING, ED - Abnormal; Notable for the following components:   Glucose-Capillary 113 (*)    All other components within normal limits  APTT  DIFFERENTIAL  URINALYSIS, ROUTINE W REFLEX MICROSCOPIC  TYPE AND SCREEN    EKG None  Radiology CT CERVICAL SPINE WO CONTRAST  Result Date: 02/09/2021 CLINICAL DATA:  Trauma post fall EXAM: CT CERVICAL SPINE WITHOUT  CONTRAST TECHNIQUE: Multidetector CT imaging of the cervical spine was performed without intravenous contrast. Multiplanar CT image reconstructions were also generated. COMPARISON:  None. FINDINGS: Alignment: 1-2 mm anterolisthesis C5-6. 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 No acute fracture or dislocation. Soft tissues and spinal canal: No prevertebral fluid or swelling. No visible canal hematoma. Heavily calcified bilateral carotid bifurcation plaque. Disc levels: Moderate narrowing C5-6 interspace with posterior protrusion. Multilevel facet DJD  which may account for the mild grade 1 anterolisthesis C5-6. Upper chest: Negative. Other: None IMPRESSION: 1. Negative for for acute fracture or dislocation. 2. Chronic and degenerative changes as above. Electronically Signed   By: Corlis Leak M.D.   On: 02/09/2021 14:44   DG Pelvis Portable  Result Date: 02/09/2021 CLINICAL DATA:  Larey Seat EXAM: PORTABLE PELVIS 1-2 VIEWS COMPARISON:  CT 04/15/2016 FINDINGS: There is no evidence of pelvic fracture or diastasis. No pelvic bone lesions are seen. Chronic pelvic phleboliths. IMPRESSION: Negative. Electronically Signed   By: Corlis Leak M.D.   On: 02/09/2021 14:50   DG Chest Port 1 View  Result Date: 02/09/2021 CLINICAL DATA:  Trauma level 2 Fall. Right hand finger deformity(new) fell EXAM: PORTABLE CHEST - 1 VIEW COMPARISON:  04/25/2016 FINDINGS: Relatively low lung volumes. No focal infiltrate. Pleural effusions seen previously have resolved. No pneumothorax. Heart size upper limits normal for technique. Aortic Atherosclerosis (ICD10-170.0). Visualized bones unremarkable. IMPRESSION: No acute cardiopulmonary disease. Electronically Signed   By: Corlis Leak M.D.   On: 02/09/2021 14:46   DG Hand Complete Right  Result Date: 02/09/2021 CLINICAL DATA:  Finger deformity post fall EXAM: RIGHT HAND - COMPLETE 3+ VIEW COMPARISON:  None. FINDINGS: Posterior dislocation of the right long finger PIP joint. No definite fracture. DJD involving the DIP joints of the index and long fingers, index finger PIP, and thumb interphalangeal joint. Diffuse osteopenia. Lucency extends partially across the fourth metacarpal although I do not see a fracture on the lateral projection. IMPRESSION: 1. Right long finger PIP posterior dislocation. 2. Osteopenia and DJD as above. 3. Lucency partially across the fourth metacarpal, possible fracture. Correlate with point tenderness. Electronically Signed   By: Corlis Leak M.D.   On: 02/09/2021 14:49   DG Finger Middle Right  Result Date:  02/09/2021 CLINICAL DATA:  Status post reduction of the third digit. EXAM: RIGHT MIDDLE FINGER 2+V COMPARISON:  Same day hand radiographs dated 02/09/2021. FINDINGS: There has been interval reduction of the third digit proximal interphalangeal joint. Moderate degenerative changes are seen at multiple interphalangeal joints. An apparent fracture of the fourth metatarsal is redemonstrated. IMPRESSION: Interval reduction of the third digit proximal interphalangeal joint. Electronically Signed   By: Romona Curls M.D.   On: 02/09/2021 15:11   CT HEAD CODE STROKE WO CONTRAST  Result Date: 02/09/2021 CLINICAL DATA:  Code stroke. Neuro deficit, acute, stroke suspected. Larey Seat today with trauma to the head. EXAM: CT HEAD WITHOUT CONTRAST TECHNIQUE: Contiguous axial images were obtained from the base of the skull through the vertex without intravenous contrast. COMPARISON:  11/10/2019. FINDINGS: Brain: Generalized atrophy. No sign of acute infarction, mass lesion, hemorrhage, hydrocephalus or extra-axial collection. Vascular: There is atherosclerotic calcification of the major vessels at the base of the brain. Skull: No skull fracture. Sinuses/Orbits: Sinuses are clear.  No intraorbital abnormality. Other: Right forehead and periorbital soft tissue hematoma on the right. ASPECTS [facility] Stroke Program Early CT Score) - Ganglionic level infarction (caudate, lentiform  nuclei, internal capsule, insula, M1-M3 cortex): 7 - Supraganglionic infarction (M4-M6 cortex): 3 Total score (0-10 with 10 being normal): 10 IMPRESSION: 1. No acute finding.  Age related atrophy. 2. ASPECTS is 10 3. Right forehead and periorbital soft tissue hematoma. No underlying skull fracture. 4. These results were communicated to at 1:55 pm on 02/09/2021 by text page via the [facility] messaging system. Electronically Signed   By: Paulina FusiMark  Shogry M.D.   On: 02/09/2021 13:56    Procedures .Ortho Injury Treatment  Date/Time: 02/09/2021 2:34 PM Performed by:  Gloris Manchesterixon, Sirron Francesconi, MD Authorized by: Gloris Manchesterixon, Chucky Homes, MD   Consent:    Consent obtained:  Verbal   Consent given by:  Patient and spouse   Alternatives discussed:  No treatment and delayed treatmentInjury location: finger Location details: right long finger Injury type: dislocation Dislocation type: PIP Pre-procedure neurovascular assessment: neurovascularly intact Pre-procedure distal perfusion: normal Pre-procedure neurological function: normal Pre-procedure range of motion: reduced  Anesthesia: Local anesthesia used: no  Patient sedated: NoManipulation performed: yes Reduction successful: yes X-ray confirmed reduction: yes Immobilization: splint Splint type: static finger Splint Applied by: Milon Dikesrtho Tech Supplies used: aluminum splint Post-procedure neurovascular assessment: post-procedure neurovascularly intact Post-procedure distal perfusion: normal Post-procedure neurological function: normal Post-procedure range of motion: improved     Medications Ordered in ED Medications  sodium chloride flush (NS) 0.9 % injection 3 mL (has no administration in time range)  Tdap (BOOSTRIX) injection 0.5 mL (0.5 mLs Intramuscular Given 02/09/21 1621)    ED Course  I have reviewed the triage vital signs and the nursing notes.  Pertinent labs & imaging results that were available during my care of the patient were reviewed by me and considered in my medical decision making (see chart for details).    MDM Rules/Calculators/A&P                           Patient presents after a fall on blood thinners.  He take Xarelto at baseline.  EMS was called.  EMS noted bilateral leg weakness.  Patient initially arrived as a code stroke given his lower extremity weakness.  Patient taken directly to CT scanner.  Neurology team present at bedside.  Exam on arrival notable for good strength in all extremities.  Presentation not consistent with acute stroke.  CT imaging was obtained but noncontrasted only to  assess for possible intracranial hemorrhage.  CT of head did not show any acute findings.  Patient underwent further work-up.  Right middle finger notably deformed.  X-ray imaging confirmed PIP dislocation.  I spoke with the patient and his wife, who is at bedside, regarding management.  Patient and wife were offered nerve block prior to reduction.  Both stated that they would prefer just a quick reduction without the poking and the needles.  Finger dislocation was reduced without any complications.  Patient subsequently had full range of motion.  AlumaFoam splint was applied by Ortho tech.  Patient's remaining work-up results were reassuring.  He was able to stand and walk around the room.  He denied any discomfort, dizziness, or unsteadiness.  Patient's wife present stated that this is his baseline mobility.  Patient and wife both feel comfortable and anxious to go home.  They were advised to follow-up with her primary care doctor on Monday.  Patient was discharged in stable condition.  Final Clinical Impression(s) / ED Diagnoses Final diagnoses:  Trauma    Rx / DC Orders ED Discharge Orders  None        Gloris Manchester, MD 02/09/21 1726

## 2021-02-09 NOTE — ED Triage Notes (Signed)
Pt BIB [facility] after [facility]. Pt is on blood thinners. Pt has an obvious right finger deformity, right hematoma above the eye. Pt denies any pain. Pt is alert and oriented at this time.

## 2021-02-09 NOTE — Consult Note (Signed)
Neurology Consultation  Reason for Consult: Fall- blood thinners Referring Physician: Dr. Durwin Nora  CC: Generalized weakness, fall - on blood thinners  History is obtained from: Chart, EMS  HPI: Bruce Mann is a 87 y.o. male past medical history of atrial fibrillation on Xarelto, coronary artery disease, diabetes, CHF, hypertension, hyperlipidemia, dementia with last Mini-Mental status exam 12/30 this year, presenting for evaluation via EMS due to having had a fall.  EMS was called because the patient was in the bathroom and had a unwitnessed fall.  He had a big hematoma over his right eye and forehead.  He also had apparently broken his right fingers.  On EMS initial evaluation, he was weak in bilateral legs worse than his baseline weakness and his gait was more of than his baseline mild gait impairment.  For these reasons, code stroke was activated. ER also activated a level 2 trauma alert because of fall and patient being on blood thinners. Patient does not complain of headache or chest pain.  He came in a c-collar.  Does not complain of neck pain Does not report any focal neurological deficits himself   LKW: 12:30 PM tpa given?: no, on Xarelto, probably not a stroke Premorbid modified Rankin scale (mRS): 2   ROS: Full ROS was performed and is negative except as noted in the HPI  Past Medical History:  Diagnosis Date   Anemia    Atrial fibrillation (HCC) 2014   CAD (coronary artery disease)    CHF (congestive heart failure) (HCC) 03/2016   NEW ONSET   Diabetes mellitus type II 2005   Dyslipidemia    Erectile dysfunction    Gait instability    Hiatal hernia    Hyperlipidemia    Hypertension    Shingles 05/2017   Weakness 03/2016    Family History  Problem Relation Age of Onset   Stroke Mother    Heart attack Father    Heart attack Brother    Neuropathy Brother    Colon cancer Neg Hx    Social History:   reports that he has never smoked. He has never used smokeless  tobacco. He reports current alcohol use. He reports that he does not use drugs.  Medications  Current Facility-Administered Medications:    sodium chloride flush (NS) 0.9 % injection 3 mL, 3 mL, Intravenous, Once, Gloris Manchester, MD  Current Outpatient Medications:    Acetaminophen (TYLENOL 8 HOUR PO), Take 2 tablets by mouth as needed., Disp: , Rfl:    atorvastatin (LIPITOR) 80 MG tablet, Take 1 tablet (80 mg total) by mouth daily., Disp: 90 tablet, Rfl: 3   calcium carbonate (TUMS - DOSED IN MG ELEMENTAL CALCIUM) 500 MG chewable tablet, Chew 1 tablet by mouth daily as needed for indigestion or heartburn., Disp: , Rfl:    Calcium Carbonate-Vitamin D (CALCIUM PLUS VITAMIN D PO), Take 1 tablet by mouth daily. , Disp: , Rfl:    carvedilol (COREG) 3.125 MG tablet, Take 1 tablet (3.125 mg total) by mouth 2 (two) times daily with a meal., Disp: 180 tablet, Rfl: 3   Choline Fenofibrate (FENOFIBRIC ACID) 135 MG CPDR, Take 1 capsule by mouth daily., Disp: 90 capsule, Rfl: 3   diphenhydrAMINE (BENADRYL) 25 MG tablet, Take 25 mg by mouth at bedtime., Disp: , Rfl:    Docusate Calcium (STOOL SOFTENER PO), Take 1-3 Doses by mouth daily., Disp: , Rfl:    donepezil (ARICEPT) 10 MG tablet, Take 1 tablet (10 mg total) by mouth at bedtime.  Please call us to increase this dose after a 3 month period., Disp: 90 tablet, Rfl: 1   famotidine (PEPCID) 20 MG tablet, Take 20 mg by mouth 2 (two) times daily., Disp: , Rfl:    furosemide (LASIX) 40 MG tablet, Take 0.5 tablets (20 mg total) by mouth daily., Disp: 45 tablet, Rfl: 3   losartan (COZAAR) 25 MG tablet, Take 1 tablet (25 mg total) by mouth daily., Disp: 90 tablet, Rfl: 3   memantine (NAMENDA) 10 MG tablet, Take 1 tablet (10 mg total) by mouth 2 (two) times daily., Disp: 180 tablet, Rfl: 1   metFORMIN (GLUCOPHAGE) 500 MG tablet, Take 1 tablet (500 mg total) by mouth 2 (two) times daily with a meal., Disp: , Rfl:    Multiple Vitamin (MULTIVITAMIN) tablet, Take 1  tablet by mouth daily., Disp: , Rfl:    MYRBETRIQ 25 MG TB24 tablet, Take 25 mg by mouth daily., Disp: , Rfl:    NITROSTAT 0.4 MG SL tablet, DISSOLVE ONE TABLET UNDER THE TONGUE EVERY 5 MINUTES AS NEEDED FOR CHEST PAIN.  DO NOT EXCEED A TOTAL OF 3 DOSES IN 15 MINUTES, Disp: 25 tablet, Rfl: 11   Omega-3 Fatty Acids (OMEGA 3 500 PO), Take 1 capsule by mouth daily., Disp: , Rfl:    oxybutynin (DITROPAN-XL) 5 MG 24 hr tablet, Take 5 mg by mouth 2 (two) times daily., Disp: , Rfl:    potassium chloride SA (KLOR-CON) 20 MEQ tablet, TAKE 1  BY MOUTH ONCE DAILY, Disp: 90 tablet, Rfl: 3   Rivaroxaban (XARELTO) 15 MG TABS tablet, Take 1 tablet (15 mg total) by mouth daily with supper., Disp: 90 tablet, Rfl: 3   Exam: Current vital signs: Wt 66.8 kg   BMI 23.07 kg/m  Vital signs in last 24 hours: Weight:  [66.8 kg] 66.8 kg (08/13 1341)  General: Awake alert in no distress HEENT: Normocephalic, right eye lid and forehead bruise CVS: Irregularly irregular Abdomen nondistended nontender Extremities warm well perfused with right hand in bandage due to concern for broken fingers-being evaluated by the EDP. Neurological exam Awake alert oriented x3 Speech is mildly dysarthric No evidence of aphasia Cranial nerves II to XII intact Motor examination with no drift in any of the 4 extremities Sensation intact to light touch all over Coordination with no dysmetria NIH stroke scale 1 for dysarthria.  Labs I have reviewed labs in epic and the results pertinent to this consultation are: CBC    Component Value Date/Time   WBC 8.9 03/15/2018 1458   WBC 6.2 03/12/2017 1412   WBC 5.1 04/18/2016 0422   RBC 4.05 (L) 03/15/2018 1458   HGB 12.6 (L) 02/09/2021 1354   HGB 12.5 (L) 03/15/2018 1458   HGB 13.4 03/12/2017 1412   HCT 37.0 (L) 02/09/2021 1354   HCT 40.8 03/12/2017 1412   PLT 137 (L) 03/15/2018 1458   PLT 157 03/12/2017 1412   MCV 94.6 03/15/2018 1458   MCV 94.4 03/12/2017 1412   MCH 30.9  03/15/2018 1458   MCHC 32.6 03/15/2018 1458   RDW 14.4 03/15/2018 1458   RDW 14.5 03/12/2017 1412   LYMPHSABS 1.6 03/15/2018 1458   LYMPHSABS 1.8 03/12/2017 1412   MONOABS 0.7 03/15/2018 1458   MONOABS 0.7 03/12/2017 1412   EOSABS 0.1 03/15/2018 1458   EOSABS 0.2 03/12/2017 1412   BASOSABS 0.0 03/15/2018 1458   BASOSABS 0.0 03/12/2017 1412    CMP     Component Value Date/Time   NA 141 02/09/2021 1354  K 4.4 02/09/2021 1354   CL 105 02/09/2021 1354   CO2 26 04/25/2016 1143   GLUCOSE 118 (H) 02/09/2021 1354   BUN 37 (H) 02/09/2021 1354   CREATININE 1.30 (H) 02/09/2021 1354   CREATININE 1.43 (H) 04/25/2016 1143   CALCIUM 9.7 04/25/2016 1143   PROT 5.6 (L) 04/17/2016 0420   ALBUMIN 2.8 (L) 04/17/2016 0420   AST 133 (H) 04/17/2016 0420   ALT 75 (H) 04/17/2016 0420   ALKPHOS 34 (L) 04/17/2016 0420   BILITOT 1.3 (H) 04/17/2016 0420   GFRNONAA 45 (L) 04/25/2016 1143   GFRAA 52 (L) 04/25/2016 1143   Imaging I have reviewed the images obtained: CT-head-no acute changes.  Specifically, no evidence of evolving big infarction or bleeding. CT C-spine-pending  Assessment:  Patient seen for code stroke activation after he had a fall at home.  Patient is on Xarelto, had a fall and had difficulty with walking as well as weakness in both legs worse than his somewhat mild baseline leg weakness for which a code stroke was activated. On my examination, he appears nonfocal. Simultaneous level 2 trauma activation being managed by EDP.  Given his past medical history, stroke is possible but current examination looks reassuring. If he has any focal deficits on repeat examinations, consider MRI of the brain. He does not have an exam suggestive of any LVO hence vessel imaging was not performed. He is not a candidate for tPA due to being on Xarelto.  Recommendations: As of right now, no acute stroke work-up needed. No emergent stroke intervention indicated Management to the transfer EDP. If  concern for new focal deficits arises, an MRI of the brain without contrast should be pursued. Plan was discussed with Dr. Durwin Nora in the ER.   -- Milon Dikes, MD Neurologist [facility] Pager: [phone]

## 2021-02-09 NOTE — Progress Notes (Signed)
Orthopedic Tech Progress Note Patient Details:  Bruce Mann 30-Mar-1931 [id number]  Ortho Devices Type of Ortho Device: Finger splint Ortho Device/Splint Location: Right hand Ortho Device/Splint Interventions: Application   Post Interventions Patient Tolerated: Well  Bruce Mann 02/09/2021, 3:08 PM

## 2021-02-09 NOTE — Progress Notes (Signed)
Orthopedic Tech Progress Note Patient Details:  Bruce Mann Dec 10, 1930 [id number] Level 2 Trauma  Patient ID: TRAPPER MEECH, male   DOB: 01-20-1931, 87 y.o.   MRN: [id number]  Smitty Pluck 02/09/2021, 2:10 PM

## 2021-03-28 ENCOUNTER — Other Ambulatory Visit: Payer: Self-pay | Admitting: Cardiology

## 2021-03-28 DIAGNOSIS — I2510 Atherosclerotic heart disease of native coronary artery without angina pectoris: Secondary | ICD-10-CM

## 2021-05-15 ENCOUNTER — Ambulatory Visit: Payer: Medicare Other | Admitting: Family Medicine

## 2021-05-15 ENCOUNTER — Encounter: Payer: Self-pay | Admitting: Family Medicine

## 2021-05-15 ENCOUNTER — Other Ambulatory Visit: Payer: Self-pay

## 2021-05-15 VITALS — BP 155/64 | HR 60 | Ht 67.0 in | Wt 141.0 lb

## 2021-05-15 DIAGNOSIS — F0390 Unspecified dementia without behavioral disturbance: Secondary | ICD-10-CM

## 2021-05-15 DIAGNOSIS — W19XXXD Unspecified fall, subsequent encounter: Secondary | ICD-10-CM

## 2021-05-15 MED ORDER — MEMANTINE HCL 10 MG PO TABS: 10.0000 mg | ORAL_TABLET | Freq: Two times a day (BID) | ORAL | 3 refills | Status: DC

## 2021-05-15 MED ORDER — DONEPEZIL HCL 10 MG PO TABS: 10.0000 mg | ORAL_TABLET | Freq: Every day | ORAL | 3 refills | Status: DC

## 2021-05-15 NOTE — Progress Notes (Signed)
Chief Complaint  Patient presents with   Follow-up    Pt with wife, rm 1. Memory stable from the last visit. Short term memory    HISTORY OF PRESENT ILLNESS: 05/15/21 ALL: Bruce Mann returns for follow up for dementia. He continues Aricept 10mg  daily. We increased Namenda to 10mg  BID at last visit 10/2020. He was seen in the ER 02/09/2021 following an unwitnessed fall in the bathroom at [facility]. He was without his walker that day. EMS was concerned of lower extremity weakness and code stroke initiated. CT was unremarkable. Neurology consulted and exam not consistent with acute stroke. Right middle finger dislocation was reduced and patient was discharged home. Since, he is doing well. Memory is about the same. He is tolerating meds. No significant changes and no additional falls.   11/12/2020 ALL:  Bruce Mann returns for dementia follow up. We continues Aricept 10mg  daily and added Namenda 5mg  BID at last visit 07/2020. He has tolerated it well. Mrs Hrivnak isn't sure if it has helped much. Memory seems to wax and wane. He continues to be independent with ADLs. He does not drive. He is eating normally. He is sleeping more but seems to rest better.  He is not very active. He did start PT on his own accord for strength training. He is going 2-3 times a week. He does feel this has been helpful. He denies falls. He is using a walker. He is meeting with his friends for coffee regularly.   08/01/2020 ALL:  Bruce Mann is a 87 y.o. male here today for follow up for dementia. He was started on Aricept 5mg  in 01/2020 and increased dose to 10mg  in 04/2020. He has tolerated medication fairly well. Uncertain if they have noticed any improvements. He continues to have difficulty with short term memory is very repetitive in questioning. He is doing well, otherwise. He is considering going back to the gym to participate in a senior exercise program. He is not driving at this time due to some pain concerns. He continues to  perform ADL's independently. He is walking without difficulty.    HISTORY (copied from Dr Dohmeier's previous note)  [name] is a 87 year old Caucasian male patient seen here as a referral on 01/30/2020 .  Chief concern according to patient : memory loss -   I have the pleasure of seeing Bruce Mann today, a right-handed White or Caucasian male with a possible dementia  disorder.  he has a past medical history of Anemia, Atrial fibrillation ([place]) 10-24-2012), CAD (coronary artery disease), CHF (congestive heart failure) ([place]) (03/2016), Diabetes mellitus type II 10-25-2003), Dyslipidemia,Hiatal hernia, Hyperlipidemia, Hypertension, Shingles (05/2017) on the right upper back.   Family medical history: No other family member with memory loss. Brother died in 10-25-2022 and had few memory issues at age 47.    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 He lives in a household with 2 persons. Family status is married with 4 children, 5 grandchildren.  The patient currently works a little in Personal assistant.  Pets are not present. Tobacco use-  none.   ETOH use : seldomly,  Caffeine intake in form of Coffee( 1 cup decaffeinated ) Soda( social ) Tea ( /) or energy drinks.   Patient has noted difficulties to answer questions, is unsure about number of grandchildren. Twice a week in the gym. He does drive in town, not highway.  He has had days when he felt unable to imaging to plan the route to a location. He is a native [place] resident.     REVIEW OF SYSTEMS: Out of a complete 14 system review of symptoms, the patient complains only of the following symptoms, memory loss, bursitis and all other reviewed systems are negative.    ALLERGIES: No Known Allergies   HOME MEDICATIONS: Outpatient Medications Prior to Visit  Medication Sig Dispense Refill   Acetaminophen (TYLENOL 8 HOUR PO) Take 2 tablets by mouth as needed.      atorvastatin (LIPITOR) 80 MG tablet Take 1 tablet (80 mg total) by mouth daily. 90 tablet 3   calcium carbonate (TUMS - DOSED IN MG ELEMENTAL CALCIUM) 500 MG chewable tablet Chew 1 tablet by mouth daily as needed for indigestion or heartburn.     Calcium Carbonate-Vitamin D (CALCIUM PLUS VITAMIN D PO) Take 1 tablet by mouth daily.      carvedilol (COREG) 3.125 MG tablet Take 1 tablet (3.125 mg total) by mouth 2 (two) times daily with a meal. 180 tablet 3   Choline Fenofibrate (FENOFIBRIC ACID) 135 MG CPDR Take 1 capsule by mouth daily. 90 capsule 3   diphenhydrAMINE (BENADRYL) 25 MG tablet Take 25 mg by mouth at bedtime.     Docusate Calcium (STOOL SOFTENER PO) Take 1-3 Doses by mouth daily.     furosemide (LASIX) 40 MG tablet Take 0.5 tablets (20 mg total) by mouth daily. 45 tablet 3   metFORMIN (GLUCOPHAGE) 500 MG tablet Take 1 tablet (500 mg total) by mouth 2 (two) times daily with a meal.     Multiple Vitamin (MULTIVITAMIN) tablet Take 1 tablet by mouth daily.     nitroGLYCERIN (NITROSTAT) 0.4 MG SL tablet DISSOLVE ONE TABLET UNDER THE TONGUE EVERY 5 MINUTES AS NEEDED FOR CHEST PAIN.  DO NOT EXCEED A TOTAL OF 3 DOSES IN 15 MINUTES 25 tablet 3   Omega-3 Fatty Acids (OMEGA 3 500 PO) Take 1 capsule by mouth daily.     oxybutynin (DITROPAN-XL) 5 MG 24 hr tablet Take 5 mg by mouth 2 (two) times daily.     potassium chloride SA (KLOR-CON) 20 MEQ tablet TAKE 1  BY MOUTH ONCE DAILY 90 tablet 3   raNITIdine HCl (ZANTAC 75 PO) Take by mouth.     Rivaroxaban (XARELTO) 15 MG TABS tablet Take 1 tablet (15 mg total) by mouth daily with supper. 90 tablet 3   donepezil (ARICEPT) 10 MG tablet Take 1 tablet (10 mg total) by mouth at bedtime. Please call us to increase this dose after a 3 month period. 90 tablet 1   memantine (NAMENDA) 10 MG tablet Take 1 tablet (10 mg total) by mouth 2 (two) times daily. 180 tablet 1   losartan (COZAAR) 25 MG tablet Take 1 tablet (25 mg total) by mouth daily. 90 tablet 3    famotidine (PEPCID) 20 MG tablet Take 20 mg by mouth 2 (two) times daily.     MYRBETRIQ 25 MG TB24 tablet Take 25 mg by mouth daily.     No facility-administered medications prior to visit.     PAST MEDICAL HISTORY: Past Medical History:  Diagnosis Date   Anemia    Atrial fibrillation (HCC) 2014   CAD (coronary artery disease)    CHF (congestive heart failure) (HCC) 03/2016   NEW ONSET   Diabetes mellitus type II 2005   Dyslipidemia    Erectile dysfunction    Gait instability  Hiatal hernia    Hyperlipidemia    Hypertension    Shingles 05/2017   Weakness 03/2016     PAST SURGICAL HISTORY: Past Surgical History:  Procedure Laterality Date   CARDIAC CATHETERIZATION     Ejection Fraction 60%   CATARACT EXTRACTION, BILATERAL     COLONOSCOPY     TONSILLECTOMY       FAMILY HISTORY: Family History  Problem Relation Age of Onset   Stroke Mother    Heart attack Father    Heart attack Brother    Neuropathy Brother    Colon cancer Neg Hx      SOCIAL HISTORY: Social History   Socioeconomic History   Marital status: Married    Spouse name: Izora Gala   Number of children: 4   Years of education: Not on file   Highest education level: Not on file  Occupational History   Occupation: real Environmental education officer  Tobacco Use   Smoking status: Never   Smokeless tobacco: Never  Vaping Use   Vaping Use: Never used  Substance and Sexual Activity   Alcohol use: Yes    Comment: Occasional glass of wine   Drug use: No   Sexual activity: Not on file  Other Topics Concern   Not on file  Social History Narrative   Not on file   Social Determinants of Health   Financial Resource Strain: Not on file  Food Insecurity: Not on file  Transportation Needs: Not on file  Physical Activity: Not on file  Stress: Not on file  Social Connections: Not on file  Intimate Partner Violence: Not on file      PHYSICAL EXAM  Vitals:   05/15/21 1431  BP: (!) 155/64  Pulse: 60   Weight: 141 lb (64 kg)  Height: 5\' 7"  (1.702 m)    Body mass index is 22.08 kg/m.   Generalized: Well developed, in no acute distress  Cardiology: normal rate and rhythm, no murmur auscultated  Respiratory: clear to auscultation bilaterally    Neurological examination  Mentation: Alert, not oriented to time, but he is to place and history taking. Follows all commands speech and language fluent Cranial nerve II-XII: Pupils were equal round reactive to light. Extraocular movements were full, visual field were full on confrontational test. Facial sensation and strength were normal. Head turning and shoulder shrug  were normal and symmetric. Motor: The motor testing reveals 5 over 5 strength of all 4 extremities. Good symmetric motor tone is noted throughout.  Sensory: Sensory testing is intact to soft touch on all 4 extremities. No evidence of extinction is noted.  Coordination: Cerebellar testing reveals good finger-nose-finger and heel-to-shin bilaterally.  Gait and station: Pushes to standing position. Gait is stable with walker. He does have a slightly stooped posture today, could be related to pain.    DIAGNOSTIC DATA (LABS, IMAGING, TESTING) - I reviewed patient records, labs, notes, testing and imaging myself where available.  Lab Results  Component Value Date   WBC 6.7 02/09/2021   HGB 12.6 (L) 02/09/2021   HCT 37.0 (L) 02/09/2021   MCV 101.8 (H) 02/09/2021   PLT 146 (L) 02/09/2021      Component Value Date/Time   NA 141 02/09/2021 1354   K 4.4 02/09/2021 1354   CL 105 02/09/2021 1354   CO2 27 02/09/2021 1350   GLUCOSE 118 (H) 02/09/2021 1354   BUN 37 (H) 02/09/2021 1354   CREATININE 1.30 (H) 02/09/2021 1354   CREATININE 1.43 (H) 04/25/2016 1143  CALCIUM 9.8 02/09/2021 1350   PROT 6.5 02/09/2021 1350   ALBUMIN 3.5 02/09/2021 1350   AST 50 (H) 02/09/2021 1350   ALT 30 02/09/2021 1350   ALKPHOS 42 02/09/2021 1350   BILITOT 0.8 02/09/2021 1350   GFRNONAA 57 (L)  02/09/2021 1350   GFRNONAA 45 (L) 04/25/2016 1143   GFRAA 52 (L) 04/25/2016 1143   No results found for: CHOL, HDL, LDLCALC, LDLDIRECT, TRIG, CHOLHDL No results found for: HGBA1C Lab Results  Component Value Date   VITAMINB12 583 04/17/2016   Lab Results  Component Value Date   TSH 5.557 (H) 04/16/2016    MMSE - Mini Mental State Exam 05/15/2021 11/12/2020 08/01/2020  Orientation to time 0 1 4  Orientation to Place 4 3 3   Registration 3 0 3  Attention/ Calculation 1 0 0  Recall 0 0 0  Language- name 2 objects 2 2 2   Language- repeat 1 1 0  Language- follow 3 step command 3 2 3   Language- read & follow direction 1 1 1   Write a sentence 0 1 1  Copy design 0 1 0  Total score 15 12 17      No flowsheet data found.   ASSESSMENT AND PLAN  87 y.o. year old male  has a past medical history of Anemia, Atrial fibrillation ([place]) (2014), CAD (coronary artery disease), CHF (congestive heart failure) ([place]) (03/2016), Diabetes mellitus type II (2005), Dyslipidemia, Erectile dysfunction, Gait instability, Hiatal hernia, Hyperlipidemia, Hypertension, Shingles (05/2017), and Weakness (03/2016). here with   Dementia without behavioral disturbance [facility])  Mr Arther is doing fairly well, today.  He has tolerated Aricept 10 mg daily and Namenda 10mg  BID. We will continue current treatment plan. Fall precautions reviewed. He will continue healthy lifestyle habits. Memory compensation strategies reviewed. I have encouraged him to continue regular physical and mental activity.  He will continue close follow-up with primary care.  I will have him follow-up with me in 6 months.  He verbalizes understanding and agreement with this plan.  No orders of the defined types were placed in this encounter.    Meds ordered this encounter  Medications   memantine (NAMENDA) 10 MG tablet    Sig: Take 1 tablet (10 mg total) by mouth 2 (two) times daily.    Dispense:  180 tablet    Refill:  3    Order Specific  Question:   Supervising Provider    Answer:   Melvenia Beam [id number]   donepezil (ARICEPT) 10 MG tablet    Sig: Take 1 tablet (10 mg total) by mouth at bedtime. Please call us to increase this dose after a 3 month period.    Dispense:  90 tablet    Refill:  3    Order Specific Question:   Supervising Provider    Answer:   Melvenia Beam [id number]      Debbora Presto, MSN, FNP-C 05/15/2021, 4:02 PM  [facility] [address] [place], [place] [address] [phone]

## 2021-05-15 NOTE — Patient Instructions (Signed)
Below is our plan:  We will continue Aricept and Namenda.   Please make sure you are staying well hydrated. I recommend 50-60 ounces daily. Well balanced diet and regular exercise encouraged. Consistent sleep schedule with 6-8 hours recommended.   Please continue follow up with care team as directed.   Follow up with me in 6 months  You may receive a survey regarding today's visit. I encourage you to leave honest feed back as I do use this information to improve patient care. Thank you for seeing me today!   Management of Memory Problems   There are some general things you can do to help manage your memory problems.  Your memory may not in fact recover, but by using techniques and strategies you will be able to manage your memory difficulties better.   1)  Establish a routine. Try to establish and then stick to a regular routine.  By doing this, you will get used to what to expect and you will reduce the need to rely on your memory.  Also, try to do things at the same time of day, such as taking your medication or checking your calendar first thing in the morning. Think about think that you can do as a part of a regular routine and make a list.  Then enter them into a daily planner to remind you.  This will help you establish a routine.   2)  Organize your environment. Organize your environment so that it is uncluttered.  Decrease visual stimulation.  Place everyday items such as keys or cell phone in the same place every day (ie.  Basket next to front door) Use post it notes with a brief message to yourself (ie. Turn off light, lock the door) Use labels to indicate where things go (ie. Which cupboards are for food, dishes, etc.) Keep a notepad and pen by the telephone to take messages   3)  Memory Aids A diary or journal/notebook/daily planner Making a list (shopping list, chore list, to do list that needs to be done) Using an alarm as a reminder (kitchen timer or cell phone  alarm) Using cell phone to store information (Notes, Calendar, Reminders) Calendar/White board placed in a prominent position Post-it notes   In order for memory aids to be useful, you need to have good habits.  It's no good remembering to make a note in your journal if you don't remember to look in it.  Try setting aside a certain time of day to look in journal.   4)  Improving mood and managing fatigue. There may be other factors that contribute to memory difficulties.  Factors, such as anxiety, depression and tiredness can affect memory. Regular gentle exercise can help improve your mood and give you more energy. Simple relaxation techniques may help relieve symptoms of anxiety Try to get back to completing activities or hobbies you enjoyed doing in the past. Learn to pace yourself through activities to decrease fatigue. Find out about some local support groups where you can share experiences with others. Try and achieve 7-8 hours of sleep at night.

## 2021-05-31 ENCOUNTER — Emergency Department
Admission: EM | Admit: 2021-05-31 | Discharge: 2021-05-31 | Disposition: A | Discharge status: Home / Self Care | Payer: Medicare Other | Source: Home / Self Care

## 2021-05-31 ENCOUNTER — Other Ambulatory Visit: Payer: Self-pay

## 2021-05-31 DIAGNOSIS — B029 Zoster without complications: Secondary | ICD-10-CM | POA: Diagnosis not present

## 2021-05-31 MED ORDER — VALACYCLOVIR HCL 1 G PO TABS: 1000.0000 mg | ORAL_TABLET | Freq: Three times a day (TID) | ORAL | 0 refills | Status: DC

## 2021-05-31 NOTE — Discharge Instructions (Addendum)
Advised patient/wife to take medication as directed with food to completion.  Encouraged patient to increase daily water intake while taking this medication.

## 2021-05-31 NOTE — ED Triage Notes (Signed)
Pt c/o rash to LT hip/upper LT leg since this am. First started to c/o of soreness to the area. Some possible blister spots. Pain 6/10

## 2021-05-31 NOTE — ED Provider Notes (Signed)
[facility]    CSN: [id number] Arrival date & time: 05/31/21  1439      History   Chief Complaint Chief Complaint  Patient presents with   Rash    Upper LT leg    HPI Bruce Mann is a 87 y.o. male.   HPI 87 year old male presents with rash of left hip/left upper leg since this morning.  Patient reports some soreness and possible blister spots.  Currently rates rash pain as 6 of 10.  PMH significant for dementia without behavioral disturbance, A. fib, chronic diastolic CHF, weakness, and shingles.  Past Medical History:  Diagnosis Date   Anemia    Atrial fibrillation (HCC) 2014   CAD (coronary artery disease)    CHF (congestive heart failure) (HCC) 03/2016   NEW ONSET   Diabetes mellitus type II 2005   Dyslipidemia    Erectile dysfunction    Gait instability    Hiatal hernia    Hyperlipidemia    Hypertension    Shingles 05/2017   Weakness 03/2016    Patient Active Problem List   Diagnosis Date Noted   Dementia without behavioral disturbance (HCC) 01/30/2020   Iron deficiency anemia 04/28/2016   Chronic diastolic CHF (congestive heart failure) (HCC) 04/16/2016   Anemia    AKI (acute kidney injury) (HCC)    Dyslipidemia    Hypertension    CAD (coronary artery disease)    Hyperlipidemia    Atrial fibrillation (HCC)     Past Surgical History:  Procedure Laterality Date   CARDIAC CATHETERIZATION     Ejection Fraction 60%   CATARACT EXTRACTION, BILATERAL     COLONOSCOPY     TONSILLECTOMY         Home Medications    Prior to Admission medications   Medication Sig Start Date End Date Taking? Authorizing Provider  valACYclovir (VALTREX) 1000 MG tablet Take 1 tablet (1,000 mg total) by mouth 3 (three) times daily. 05/31/21  Yes Trevor Iha, FNP  Acetaminophen (TYLENOL 8 HOUR PO) Take 2 tablets by mouth as needed.    [provider]  atorvastatin (LIPITOR) 80 MG tablet Take 1 tablet (80 mg total) by mouth daily. 03/05/17   [place],  Peter M, MD  calcium carbonate (TUMS - DOSED IN MG ELEMENTAL CALCIUM) 500 MG chewable tablet Chew 1 tablet by mouth daily as needed for indigestion or heartburn.    [provider]  Calcium Carbonate-Vitamin D (CALCIUM PLUS VITAMIN D PO) Take 1 tablet by mouth daily.     [provider]  carvedilol (COREG) 3.125 MG tablet Take 1 tablet (3.125 mg total) by mouth 2 (two) times daily with a meal. 03/05/17   [place], Peter M, MD  Choline Fenofibrate (FENOFIBRIC ACID) 135 MG CPDR Take 1 capsule by mouth daily. 09/24/20   [place], Peter M, MD  diphenhydrAMINE (BENADRYL) 25 MG tablet Take 25 mg by mouth at bedtime.    [provider]  Docusate Calcium (STOOL SOFTENER PO) Take 1-3 Doses by mouth daily.    [provider]  donepezil (ARICEPT) 10 MG tablet Take 1 tablet (10 mg total) by mouth at bedtime. Please call us to increase this dose after a 3 month period. 05/15/21   Lomax, Amy, NP  furosemide (LASIX) 40 MG tablet Take 0.5 tablets (20 mg total) by mouth daily. 09/24/20   [place], Peter M, MD  losartan (COZAAR) 25 MG tablet Take 1 tablet (25 mg total) by mouth daily. 09/24/20 12/23/20  [place], Peter M,  MD  memantine (NAMENDA) 10 MG tablet Take 1 tablet (10 mg total) by mouth 2 (two) times daily. 05/15/21   Lomax, Amy, NP  metFORMIN (GLUCOPHAGE) 500 MG tablet Take 1 tablet (500 mg total) by mouth 2 (two) times daily with a meal. 03/22/18   [place], Peter M, MD  Multiple Vitamin (MULTIVITAMIN) tablet Take 1 tablet by mouth daily.    [provider]  nitroGLYCERIN (NITROSTAT) 0.4 MG SL tablet DISSOLVE ONE TABLET UNDER THE TONGUE EVERY 5 MINUTES AS NEEDED FOR CHEST PAIN.  DO NOT EXCEED A TOTAL OF 3 DOSES IN 15 MINUTES 03/28/21   [place], Peter M, MD  Omega-3 Fatty Acids (OMEGA 3 500 PO) Take 1 capsule by mouth daily.    [provider]  oxybutynin (DITROPAN-XL) 5 MG 24 hr tablet Take 5 mg by mouth 2 (two) times daily. 10/11/19   [provider]  potassium  chloride SA (KLOR-CON) 20 MEQ tablet TAKE 1  BY MOUTH ONCE DAILY 11/27/20   [place], Peter M, MD  raNITIdine HCl (ZANTAC 75 PO) Take by mouth.    [provider]  Rivaroxaban (XARELTO) 15 MG TABS tablet Take 1 tablet (15 mg total) by mouth daily with supper. 03/05/17   [place], Peter M, MD    Family History Family History  Problem Relation Age of Onset   Stroke Mother    Heart attack Father    Heart attack Brother    Neuropathy Brother    Colon cancer Neg Hx     Social History Social History   Tobacco Use   Smoking status: Never   Smokeless tobacco: Never  Vaping Use   Vaping Use: Never used  Substance Use Topics   Alcohol use: Yes    Comment: Occasional glass of wine   Drug use: No     Allergies   Patient has no known allergies.   Review of Systems Review of Systems  Skin:  Positive for rash.    Physical Exam Triage Vital Signs ED Triage Vitals  Enc Vitals Group     BP 05/31/21 1500 (!) 143/73     Pulse Rate 05/31/21 1500 67     Resp 05/31/21 1500 17     Temp 05/31/21 1500 98.5 F (36.9 C)     Temp Source 05/31/21 1500 Oral     SpO2 05/31/21 1500 99 %     Weight --      Height --      Head Circumference --      Peak Flow --      Pain Score 05/31/21 1502 6     Pain Loc --      Pain Edu? --      Excl. in [place]? --    No data found.  Updated Vital Signs BP (!) 143/73 (BP Location: Right Arm)   Pulse 67   Temp 98.5 F (36.9 C) (Oral)   Resp 17   SpO2 99%       Physical Exam Vitals and nursing note reviewed.  Constitutional:      General: He is not in acute distress.    Appearance: Normal appearance. He is normal weight. He is not ill-appearing.  HENT:     Head: Normocephalic and atraumatic.     Mouth/Throat:     Mouth: Mucous membranes are moist.     Pharynx: Oropharynx is clear.  Eyes:     Extraocular Movements: Extraocular movements intact.     Conjunctiva/sclera: Conjunctivae normal.  Pupils: Pupils are equal, round, and reactive  to light.  Cardiovascular:     Rate and Rhythm: Normal rate and regular rhythm.     Pulses: Normal pulses.     Heart sounds: Normal heart sounds.  Pulmonary:     Effort: Pulmonary effort is normal.     Breath sounds: Normal breath sounds.  Musculoskeletal:        General: Normal range of motion.     Cervical back: Normal range of motion and neck supple.  Skin:    General: Skin is warm.     Comments: Left hip (inferior lateral aspect): Erythematous, grouped vesicular lesions noted, patient currently reporting 6/10 for painful rash  Neurological:     General: No focal deficit present.     Mental Status: He is alert and oriented to person, place, and time. Mental status is at baseline.     UC Treatments / Results  Labs (all labs ordered are listed, but only abnormal results are displayed) Labs Reviewed - No data to display  EKG   Radiology No results found.  Procedures Procedures (including critical care time)  Medications Ordered in UC Medications - No data to display  Initial Impression / Assessment and Plan / UC Course  I have reviewed the triage vital signs and the nursing notes.  Pertinent labs & imaging results that were available during my care of the patient were reviewed by me and considered in my medical decision making (see chart for details).     MDM: 1.  Herpes zoster without complication-Rx'd Valtrex. Advised patient/wife to take medication as directed with food to completion.  Encouraged patient to increase daily water intake while taking this medication.  Patient discharged home, hemodynamically stable. Final Clinical Impressions(s) / UC Diagnoses   Final diagnoses:  Herpes zoster without complication     Discharge Instructions      Advised patient/wife to take medication as directed with food to completion.  Encouraged patient to increase daily water intake while taking this medication.     ED Prescriptions     Medication Sig Dispense Auth.  Provider   valACYclovir (VALTREX) 1000 MG tablet Take 1 tablet (1,000 mg total) by mouth 3 (three) times daily. 21 tablet Eliezer Lofts, FNP      PDMP not reviewed this encounter.   Eliezer Lofts, FNP 05/31/21 1550

## 2021-06-20 ENCOUNTER — Telehealth: Payer: Self-pay | Admitting: Cardiology

## 2021-06-20 NOTE — Telephone Encounter (Signed)
Pt c/o swelling: STAT is pt has developed SOB within 24 hours  How much weight have you gained and in what time span? PT DOES NOT WEIGH DAILY  If swelling, where is the swelling located?  BILATERAL ANKLE SWELLING  Are you currently taking a fluid pill? YES LASIX CUT IN HALF ONCE A DAY 20 MG  Are you currently SOB? NO  Do you have a log of your daily weights (if so, list)? NO  Have you gained 3 pounds in a day or 5 pounds in a week?   Have you traveled recently? NO   PT HAS AGE RELATED MEMORY LOSS, PLEASE CONTACT WIFE [name] (WIFE)

## 2021-06-20 NOTE — Telephone Encounter (Signed)
Returned call to wife (DPR) she states that she does notice some swelling bilat ankles. No redness or any symptoms (SOB, etc...) pt does not weigh daily. She states that the "swelling is not very bad" she was just wanting to call to see what to do if this gets worse. She was afraid NOT to call because of the upcoming holiday. She will start to weigh daily again to track this swelling. She states that she will call back tomorrow if weight is up or sx change.  She expresses thanks and would like to wait at this time before taking extra lasix because he is already in the bathroom "all the time". Notified that we will have someone on call thruout the holiday weekend. Call if sx change or worsen. Verbalized understanding.

## 2021-10-03 ENCOUNTER — Ambulatory Visit: Payer: Medicare Other | Admitting: Cardiology

## 2021-10-17 NOTE — Progress Notes (Signed)
?Cardiology Office Note:   ? ?Date:  10/21/2021  ? ?ID:  Bruce Mann, DOB 25-Aug-1930, MRN [id number] ? ?PCP:  Haywood Pao, MD  ?Cardiologist:  Zaryah Seckel [place], MD  ?Electrophysiologist:  None  ? ?Referring MD: Haywood Pao, MD  ? ?Chief Complaint  ?Patient presents with  ? Atrial Fibrillation  ? Coronary Artery Disease  ? ? ?History of Present Illness:   ? ?Bruce Mann is a 88 y.o. male with a hx of CAD, diastolic heart failure, and permanent atrial fibrillation on low-dose Xarelto adjusted for age and CKD. He had angioplasty of OM1 in 2001.  He had abnormal stress test in 2009 that led to another cardiac catheterization.  This demonstrated three-vessel disease with normal EF.  He was managed medically.  He was admitted in October 2017 with acute diastolic heart failure and left pleural effusion.  Echocardiogram at the time showed normal EF, biatrial enlargement, mild pulmonary hypertension.  He was diuresed and the discharged on carvedilol, ARB and diuretic.  This initially led to orthostatic hypotension, medication was later adjusted with improvement.  ? ?Heart rate is very well controlled on the low-dose carvedilol.  He is compliant with Xarelto.   ? ?He is seen today with his wife. He reports he has been inactive. Mostly watches TV. Is very slow when he does walk. Memory is poor.  Denies any chest pain, dyspnea, palpitations. No dizziness.  ? ? ?Past Medical History:  ?Diagnosis Date  ? Anemia   ? Atrial fibrillation ([place]) 2014  ? CAD (coronary artery disease)   ? CHF (congestive heart failure) ([place]) 03/2016  ? NEW ONSET  ? Diabetes mellitus type II 2005  ? Dyslipidemia   ? Erectile dysfunction   ? Gait instability   ? Hiatal hernia   ? Hyperlipidemia   ? Hypertension   ? Shingles 05/2017  ? Weakness 03/2016  ? ? ?Past Surgical History:  ?Procedure Laterality Date  ? CARDIAC CATHETERIZATION    ? Ejection Fraction 60%  ? CATARACT EXTRACTION, BILATERAL    ? COLONOSCOPY    ? TONSILLECTOMY     ? ? ?Current Medications: ?Current Meds  ?Medication Sig  ? Acetaminophen (TYLENOL 8 HOUR PO) Take 2 tablets by mouth as needed.  ? calcium carbonate (TUMS - DOSED IN MG ELEMENTAL CALCIUM) 500 MG chewable tablet Chew 1 tablet by mouth daily as needed for indigestion or heartburn.  ? Calcium Carbonate-Vitamin D (CALCIUM PLUS VITAMIN D PO) Take 1 tablet by mouth daily.   ? carvedilol (COREG) 3.125 MG tablet Take 1 tablet (3.125 mg total) by mouth 2 (two) times daily with a meal.  ? diphenhydrAMINE (BENADRYL) 25 MG tablet Take 25 mg by mouth at bedtime. Takes as needed  ? Docusate Calcium (STOOL SOFTENER PO) Take 1-3 Doses by mouth daily.  ? donepezil (ARICEPT) 10 MG tablet Take 1 tablet (10 mg total) by mouth at bedtime. Please call us to increase this dose after a 3 month period.  ? furosemide (LASIX) 40 MG tablet Take 0.5 tablets (20 mg total) by mouth daily.  ? memantine (NAMENDA) 10 MG tablet Take 1 tablet (10 mg total) by mouth 2 (two) times daily.  ? metFORMIN (GLUCOPHAGE) 500 MG tablet Take 1 tablet (500 mg total) by mouth 2 (two) times daily with a meal.  ? Multiple Vitamin (MULTIVITAMIN) tablet Take 1 tablet by mouth daily.  ? nitroGLYCERIN (NITROSTAT) 0.4 MG SL tablet DISSOLVE ONE TABLET UNDER THE TONGUE EVERY 5 MINUTES AS NEEDED  FOR CHEST PAIN.  DO NOT EXCEED A TOTAL OF 3 DOSES IN 15 MINUTES  ? Omega-3 Fatty Acids (OMEGA 3 500 PO) Take 1 capsule by mouth daily.  ? oxybutynin (DITROPAN-XL) 5 MG 24 hr tablet Take 5 mg by mouth 2 (two) times daily.  ? oxybutynin (DITROPAN-XL) 5 MG 24 hr tablet Take 1 tablet by mouth 2 (two) times daily.  ? potassium chloride SA (KLOR-CON) 20 MEQ tablet TAKE 1  BY MOUTH ONCE DAILY  ? Rivaroxaban (XARELTO) 15 MG TABS tablet Take 1 tablet (15 mg total) by mouth daily with supper.  ? [DISCONTINUED] atorvastatin (LIPITOR) 80 MG tablet Take 1 tablet (80 mg total) by mouth daily.  ? [DISCONTINUED] Choline Fenofibrate (FENOFIBRIC ACID) 135 MG CPDR Take 1 capsule by mouth daily.  ?   ? ?Allergies:   Patient has no known allergies.  ? ?Social History  ? ?Socioeconomic History  ? Marital status: Married  ?  Spouse name: Izora Gala  ? Number of children: 4  ? Years of education: Not on file  ? Highest education level: Not on file  ?Occupational History  ? Occupation: Personal assistant  ?Tobacco Use  ? Smoking status: Never  ? Smokeless tobacco: Never  ?Vaping Use  ? Vaping Use: Never used  ?Substance and Sexual Activity  ? Alcohol use: Yes  ?  Comment: Occasional glass of wine  ? Drug use: No  ? Sexual activity: Not on file  ?Other Topics Concern  ? Not on file  ?Social History Narrative  ? Not on file  ? ?Social Determinants of Health  ? ?Financial Resource Strain: Not on file  ?Food Insecurity: Not on file  ?Transportation Needs: Not on file  ?Physical Activity: Not on file  ?Stress: Not on file  ?Social Connections: Not on file  ?  ? ?Family History: ?The patient's family history includes Heart attack in his brother and father; Neuropathy in his brother; Stroke in his mother. There is no history of Colon cancer. ? ?ROS:   ?Please see the history of present illness.    ? All other systems reviewed and are negative. ? ?EKGs/Labs/Other Studies Reviewed:   ? ?The following studies were reviewed today: ? ?Echo 04/17/2016 ?LV EF: 55% -   60% ?Study Conclusions ?  ?- Left ventricle: The cavity size was normal. Wall thickness was ?  increased in a pattern of moderate LVH. Systolic function was ?  normal. The estimated ejection fraction was in the range of 55% ?  to 60%. Wall motion was normal; there were no regional wall ?  motion abnormalities. ?- Mitral valve: Moderately calcified annulus. ?- Left atrium: The atrium was mildly dilated. ?- Right ventricle: The cavity size was mildly dilated. ?- Right atrium: The atrium was severely dilated. ?- Pulmonary arteries: Systolic pressure was moderately increased. ?  PA peak pressure: 44 mm Hg (S). ?- Pericardium, extracardiac: There was a left pleural  effusion. ? ?EKG:  EKG is not ordered today.   ? ? ?Recent Labs: ?02/09/2021: ALT 30; BUN 37; Creatinine, Ser 1.30; Hemoglobin 12.6; Platelets 146; Potassium 4.4; Sodium 141  ?Recent Lipid Panel ?No results found for: CHOL, TRIG, HDL, CHOLHDL, VLDL, LDLCALC, LDLDIRECT  ? ?Dated 10/18/19: creatinine 1.3. otherwise CMET and CBC normal. ?Dated 03/02/20: cholesterol 96, triglycerides 68, HDL 35, LDL 47.  ?Dated 06/13/20: A1c 5.6% ?Dated 11//30/22: cholesterol 103, triglycerides 62, HDL 43, LDL 48. A1c 5.4%.  ? ?Physical Exam:   ? ?VS:  BP (!) 140/50 (BP Location:  Left Arm)   Pulse 64   Ht 5\' 8"  (1.727 m)   Wt 141 lb 9.6 oz (64.2 kg)   SpO2 99%   BMI 21.53 kg/m?    ? ?Wt Readings from Last 3 Encounters:  ?10/21/21 141 lb 9.6 oz (64.2 kg)  ?05/15/21 141 lb (64 kg)  ?02/09/21 147 lb 4.3 oz (66.8 kg)  ?  ? ?GEN:  Well nourished, well developed in no acute distress ?HEENT: Normal ?NECK: No JVD; No carotid bruits ?LYMPHATICS: No lymphadenopathy ?CARDIAC: Irregularly irregular, no murmurs, rubs, gallops ?RESPIRATORY:  Clear to auscultation without rales, wheezing or rhonchi  ?ABDOMEN: Soft, non-tender, non-distended ?MUSCULOSKELETAL:  No edema; No deformity  ?SKIN: Warm and dry ?NEUROLOGIC:  Alert and oriented x 3 ?PSYCHIATRIC:  Normal affect  ? ? ? ?ASSESSMENT:   ? ?1. Permanent atrial fibrillation ([place])   ?2. Coronary artery disease involving native coronary artery of native heart without angina pectoris   ?3. Chronic diastolic heart failure ([place])   ?4. Hyperlipidemia LDL goal <70   ? ? ?PLAN:   ? ?In order of problems listed above: ? ?Permanent atrial fibrillation: On low dose carvedilol and Xarelto.  Rate controlled. Asymptomatic. ? ?CAD: Known 3 vessel disease by cath in 2009. Denies any obvious anginal symptoms.  Continue Lipitor.  Not on aspirin given the need for Xarelto. ? ?Chronic diastolic heart failure:  appears to be euvolemic. ? ?Hyperlipidemia: Last lipid panel obtained in November  showed very well-controlled  cholesterol. LDL 43. Triglycerides have been normal. I have recommended stopping fenofibrate and reducing lipitor to 40 mg daily to help with polypharmacy.  ? ?5.   HTN. BP is acceptable. ? ? ?Medication Adjustments/Labs and

## 2021-10-21 ENCOUNTER — Encounter: Payer: Self-pay | Admitting: Cardiology

## 2021-10-21 ENCOUNTER — Ambulatory Visit: Payer: Medicare Other | Admitting: Cardiology

## 2021-10-21 VITALS — BP 140/50 | HR 64 | Ht 68.0 in | Wt 141.6 lb

## 2021-10-21 DIAGNOSIS — E785 Hyperlipidemia, unspecified: Secondary | ICD-10-CM | POA: Diagnosis not present

## 2021-10-21 DIAGNOSIS — I5032 Chronic diastolic (congestive) heart failure: Secondary | ICD-10-CM

## 2021-10-21 DIAGNOSIS — I2510 Atherosclerotic heart disease of native coronary artery without angina pectoris: Secondary | ICD-10-CM | POA: Diagnosis not present

## 2021-10-21 DIAGNOSIS — I4821 Permanent atrial fibrillation: Secondary | ICD-10-CM

## 2021-10-21 MED ORDER — ATORVASTATIN CALCIUM 40 MG PO TABS: 40.0000 mg | ORAL_TABLET | Freq: Every day | ORAL | 3 refills | Status: DC

## 2021-10-21 NOTE — Patient Instructions (Signed)
Stop taking fenofibrate ? ?Reduce lipitor dose to 40 mg daily ? ? ?

## 2021-10-28 ENCOUNTER — Other Ambulatory Visit: Payer: Self-pay | Admitting: Cardiology

## 2021-11-12 NOTE — Progress Notes (Signed)
? ? ?Chief Complaint  ?Patient presents with  ? Follow-up  ?  RM 2 with spouse. Memory f/u. Last MMSE 15/30. Ambulates with walker. Today's MMSE 14/30.   ? ? ?HISTORY OF PRESENT ILLNESS: ? ?11/13/21 ALL: ?Marissa returns for follow up for dementia. He continues donepezil and memantine. He feels symptoms are stable. Mrs Stendahl agrees. She reports he had good days and bad days. She reports that he woke up yesterday morning and was asking her questions of who she was and where she was from. He seemed to be better once fully awake and moving around. He is eating well. Sleeping well. He does need help with ADLs. She doses meds. He does not drive. Gait has been stable with walker. No additional falls.  ? ?05/15/2021 ALL:  ?Royal returns for follow up for dementia. He continues Aricept 10mg  daily. We increased Namenda to 10mg  BID at last visit 10/2020. He was seen in the ER 02/09/2021 following an unwitnessed fall in the bathroom at [facility]. He was without his walker that day. EMS was concerned of lower extremity weakness and code stroke initiated. CT was unremarkable. Neurology consulted and exam not consistent with acute stroke. Right middle finger dislocation was reduced and patient was discharged home. Since, he is doing well. Memory is about the same. He is tolerating meds. No significant changes and no additional falls.  ? ?11/12/2020 ALL:  ?Raney returns for dementia follow up. We continues Aricept 10mg  daily and added Namenda 5mg  BID at last visit 07/2020. He has tolerated it well. Mrs Vandekamp isn't sure if it has helped much. Memory seems to wax and wane. He continues to be independent with ADLs. He does not drive. He is eating normally. He is sleeping more but seems to rest better.  He is not very active. He did start PT on his own accord for strength training. He is going 2-3 times a week. He does feel this has been helpful. He denies falls. He is using a walker. He is meeting with his friends for coffee regularly.   ? ?08/01/2020 ALL:  ?CURLEY FALLO is a 88 y.o. male here today for follow up for dementia. He was started on Aricept 5mg  in 01/2020 and increased dose to 10mg  in 04/2020. He has tolerated medication fairly well. Uncertain if they have noticed any improvements. He continues to have difficulty with short term memory is very repetitive in questioning. He is doing well, otherwise. He is considering going back to the gym to participate in a senior exercise program. He is not driving at this time due to some pain concerns. He continues to perform ADL's independently. He is walking without difficulty.  ? ? ?HISTORY (copied from Dr Dohmeier's previous note) ? ?ABDI ROLLE is a 88 year old Caucasian male patient seen here as a referral on 01/30/2020 .  ?Chief concern according to patient : memory loss - ?  ?I have the pleasure of seeing NIRAN CONEY today, a right-handed White or Caucasian male with a possible dementia  disorder.  he has a past medical history of Anemia, Atrial fibrillation ([place]) Oct 17, 2012), CAD (coronary artery disease), CHF (congestive heart failure) ([place]) (03/2016), Diabetes mellitus type II 10/18/03), Dyslipidemia,Hiatal hernia, Hyperlipidemia, Hypertension, Shingles (05/2017) on the right upper back.  ? Family medical history: No other family member with memory loss. Brother died in 2022/10/18 and had few memory issues at age 51.  ?  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 He lives in a household with 2 persons. Family status is married with 4 children, 5 grandchildren.  ?The patient currently works a little in Personal assistant.  ?Pets are not present. ?Tobacco use-  none.  ? ETOH use : seldomly,  ?Caffeine intake in form of Coffee( 1 cup decaffeinated ) Soda( social ) Tea ( /) or energy drinks. ?  ?Patient has noted difficulties to answer questions, is unsure about number of grandchildren. ?Twice a week in the gym. He does drive in town, not  highway.  He has had days when he felt unable to imaging to plan the route to a location. ?He is a native [place] resident.  ? ? ? ?REVIEW OF SYSTEMS: Out of a complete 14 system review of symptoms, the patient complains only of the following symptoms, memory loss, bursitis and all other reviewed systems are negative. ? ? ?ALLERGIES: ?No Known Allergies ? ? ?HOME MEDICATIONS: ?Outpatient Medications Prior to Visit  ?Medication Sig Dispense Refill  ? Acetaminophen (TYLENOL 8 HOUR PO) Take 2 tablets by mouth as needed.    ? atorvastatin (LIPITOR) 40 MG tablet Take 1 tablet (40 mg total) by mouth daily. 90 tablet 3  ? calcium carbonate (TUMS - DOSED IN MG ELEMENTAL CALCIUM) 500 MG chewable tablet Chew 1 tablet by mouth daily as needed for indigestion or heartburn.    ? Calcium Carbonate-Vitamin D (CALCIUM PLUS VITAMIN D PO) Take 1 tablet by mouth daily.     ? carvedilol (COREG) 3.125 MG tablet Take 1 tablet (3.125 mg total) by mouth 2 (two) times daily with a meal. 180 tablet 3  ? diphenhydrAMINE (BENADRYL) 25 MG tablet Take 25 mg by mouth at bedtime. Takes as needed    ? Docusate Calcium (STOOL SOFTENER PO) Take 1-3 Doses by mouth daily.    ? furosemide (LASIX) 40 MG tablet Take 1/2 (one-half) tablet by mouth once daily 45 tablet 2  ? metFORMIN (GLUCOPHAGE) 500 MG tablet Take 1 tablet (500 mg total) by mouth 2 (two) times daily with a meal.    ? Multiple Vitamin (MULTIVITAMIN) tablet Take 1 tablet by mouth daily.    ? nitroGLYCERIN (NITROSTAT) 0.4 MG SL tablet DISSOLVE ONE TABLET UNDER THE TONGUE EVERY 5 MINUTES AS NEEDED FOR CHEST PAIN.  DO NOT EXCEED A TOTAL OF 3 DOSES IN 15 MINUTES 25 tablet 3  ? Omega-3 Fatty Acids (OMEGA 3 500 PO) Take 1 capsule by mouth daily.    ? oxybutynin (DITROPAN-XL) 5 MG 24 hr tablet Take 5 mg by mouth 2 (two) times daily.    ? potassium chloride SA (KLOR-CON) 20 MEQ tablet TAKE 1  BY MOUTH ONCE DAILY 90 tablet 3  ? Rivaroxaban (XARELTO) 15 MG TABS tablet Take 1 tablet (15 mg total) by  mouth daily with supper. 90 tablet 3  ? donepezil (ARICEPT) 10 MG tablet Take 1 tablet (10 mg total) by mouth at bedtime. Please call us to increase this dose after a 3 month period. 90 tablet 3  ? memantine (NAMENDA) 10 MG tablet Take 1 tablet (10 mg total) by mouth 2 (two) times daily. 180 tablet 3  ? oxybutynin (DITROPAN-XL) 5 MG 24 hr tablet Take 1 tablet by mouth 2 (two) times daily.    ? raNITIdine HCl (ZANTAC 75 PO) Take by mouth.    ? valACYclovir (VALTREX) 1000 MG tablet Take 1 tablet (1,000 mg total) by mouth 3 (three) times daily. 21 tablet 0  ?  losartan (COZAAR) 25 MG tablet Take 1 tablet (25 mg total) by mouth daily. 90 tablet 3  ? ?No facility-administered medications prior to visit.  ? ? ? ?PAST MEDICAL HISTORY: ?Past Medical History:  ?Diagnosis Date  ? Anemia   ? Atrial fibrillation ([place]) 2014  ? CAD (coronary artery disease)   ? CHF (congestive heart failure) ([place]) 03/2016  ? NEW ONSET  ? Diabetes mellitus type II 2005  ? Dyslipidemia   ? Erectile dysfunction   ? Gait instability   ? Hiatal hernia   ? Hyperlipidemia   ? Hypertension   ? Shingles 05/2017  ? Weakness 03/2016  ? ? ? ?PAST SURGICAL HISTORY: ?Past Surgical History:  ?Procedure Laterality Date  ? CARDIAC CATHETERIZATION    ? Ejection Fraction 60%  ? CATARACT EXTRACTION, BILATERAL    ? COLONOSCOPY    ? TONSILLECTOMY    ? ? ? ?FAMILY HISTORY: ?Family History  ?Problem Relation Age of Onset  ? Stroke Mother   ? Heart attack Father   ? Heart attack Brother   ? Neuropathy Brother   ? Colon cancer Neg Hx   ? ? ? ?SOCIAL HISTORY: ?Social History  ? ?Socioeconomic History  ? Marital status: Married  ?  Spouse name: Izora Gala  ? Number of children: 4  ? Years of education: Not on file  ? Highest education level: Not on file  ?Occupational History  ? Occupation: Personal assistant  ?Tobacco Use  ? Smoking status: Never  ? Smokeless tobacco: Never  ?Vaping Use  ? Vaping Use: Never used  ?Substance and Sexual Activity  ? Alcohol use: Yes  ?  Comment:  Occasional glass of wine  ? Drug use: No  ? Sexual activity: Not on file  ?Other Topics Concern  ? Not on file  ?Social History Narrative  ? Not on file  ? ?Social Determinants of Health  ? ?[place]

## 2021-11-12 NOTE — Patient Instructions (Signed)
Below is our plan:  We will continue donepezil 10mg daily and memantine 10mg twice daily.   Please make sure you are staying well hydrated. I recommend 50-60 ounces daily. Well balanced diet and regular exercise encouraged. Consistent sleep schedule with 6-8 hours recommended.   Please continue follow up with care team as directed.   Follow up with me in 1 year   You may receive a survey regarding today's visit. I encourage you to leave honest feed back as I do use this information to improve patient care. Thank you for seeing me today!   Management of Memory Problems   There are some general things you can do to help manage your memory problems.  Your memory may not in fact recover, but by using techniques and strategies you will be able to manage your memory difficulties better.   1)  Establish a routine. Try to establish and then stick to a regular routine.  By doing this, you will get used to what to expect and you will reduce the need to rely on your memory.  Also, try to do things at the same time of day, such as taking your medication or checking your calendar first thing in the morning. Think about think that you can do as a part of a regular routine and make a list.  Then enter them into a daily planner to remind you.  This will help you establish a routine.   2)  Organize your environment. Organize your environment so that it is uncluttered.  Decrease visual stimulation.  Place everyday items such as keys or cell phone in the same place every day (ie.  Basket next to front door) Use post it notes with a brief message to yourself (ie. Turn off light, lock the door) Use labels to indicate where things go (ie. Which cupboards are for food, dishes, etc.) Keep a notepad and pen by the telephone to take messages   3)  Memory Aids A diary or journal/notebook/daily planner Making a list (shopping list, chore list, to do list that needs to be done) Using an alarm as a reminder (kitchen  timer or cell phone alarm) Using cell phone to store information (Notes, Calendar, Reminders) Calendar/White board placed in a prominent position Post-it notes   In order for memory aids to be useful, you need to have good habits.  It's no good remembering to make a note in your journal if you don't remember to look in it.  Try setting aside a certain time of day to look in journal.   4)  Improving mood and managing fatigue. There may be other factors that contribute to memory difficulties.  Factors, such as anxiety, depression and tiredness can affect memory. Regular gentle exercise can help improve your mood and give you more energy. Simple relaxation techniques may help relieve symptoms of anxiety Try to get back to completing activities or hobbies you enjoyed doing in the past. Learn to pace yourself through activities to decrease fatigue. Find out about some local support groups where you can share experiences with others. Try and achieve 7-8 hours of sleep at night.  

## 2021-11-13 ENCOUNTER — Encounter: Payer: Self-pay | Admitting: Family Medicine

## 2021-11-13 ENCOUNTER — Ambulatory Visit: Payer: Medicare Other | Admitting: Family Medicine

## 2021-11-13 VITALS — BP 150/67 | HR 63 | Wt 145.0 lb

## 2021-11-13 DIAGNOSIS — F0390 Unspecified dementia without behavioral disturbance: Secondary | ICD-10-CM

## 2021-11-13 MED ORDER — DONEPEZIL HCL 10 MG PO TABS: 10.0000 mg | ORAL_TABLET | Freq: Every day | ORAL | 3 refills | Status: DC

## 2021-11-13 MED ORDER — MEMANTINE HCL 10 MG PO TABS: 10.0000 mg | ORAL_TABLET | Freq: Two times a day (BID) | ORAL | 3 refills | Status: DC

## 2021-11-28 ENCOUNTER — Other Ambulatory Visit: Payer: Self-pay | Admitting: Cardiology

## 2021-12-05 ENCOUNTER — Emergency Department (INDEPENDENT_AMBULATORY_CARE_PROVIDER_SITE_OTHER)
Admission: EM | Admit: 2021-12-05 | Discharge: 2021-12-05 | Disposition: A | Discharge status: Home / Self Care | Payer: Medicare Other | Source: Home / Self Care

## 2021-12-05 DIAGNOSIS — B029 Zoster without complications: Secondary | ICD-10-CM

## 2021-12-05 MED ORDER — VALACYCLOVIR HCL 1 G PO TABS: 1000.0000 mg | ORAL_TABLET | Freq: Three times a day (TID) | ORAL | 0 refills | Status: DC

## 2021-12-05 NOTE — Discharge Instructions (Addendum)
Instructed patient to take medication as directed with food to completion.  Encouraged to increase daily water intake while taking this medication.  Advised patient/wife if symptoms worsen and/or unresolved please follow-up with PCP or here for further evaluation.

## 2021-12-05 NOTE — ED Triage Notes (Signed)
Pt presents with a rash located on his left hip that is painful to the touch. Pt notices rash this morning.

## 2021-12-05 NOTE — ED Provider Notes (Signed)
[facility]    CSN: [id number] Arrival date & time: 12/05/21  1411      History   Chief Complaint Chief Complaint  Patient presents with   Rash    HPI Bruce Mann is a 88 y.o. male.   HPI Very pleasant 88 year-old male presents with rash of left hip noticed this morning painful to the touch.  PMH significant for A-fib, CHF, and HTN.  Patient has history of previous shingles rash.  Patient is accompanied by his wife this afternoon.  Past Medical History:  Diagnosis Date   Anemia    Atrial fibrillation ([place]) 2014   CAD (coronary artery disease)    CHF (congestive heart failure) ([place]) 03/2016   NEW ONSET   Diabetes mellitus type II 2005   Dyslipidemia    Erectile dysfunction    Gait instability    Hiatal hernia    Hyperlipidemia    Hypertension    Shingles 05/2017   Weakness 03/2016    Patient Active Problem List   Diagnosis Date Noted   Dementia without behavioral disturbance ([place]) 01/30/2020   Iron deficiency anemia 04/28/2016   Chronic diastolic CHF (congestive heart failure) ([place]) 04/16/2016   Anemia    AKI (acute kidney injury) ([place])    Dyslipidemia    Hypertension    CAD (coronary artery disease)    Hyperlipidemia    Atrial fibrillation ([facility])     Past Surgical History:  Procedure Laterality Date   CARDIAC CATHETERIZATION     Ejection Fraction 60%   CATARACT EXTRACTION, BILATERAL     COLONOSCOPY     TONSILLECTOMY         Home Medications    Prior to Admission medications   Medication Sig Start Date End Date Taking? Authorizing Provider  valACYclovir (VALTREX) 1000 MG tablet Take 1 tablet (1,000 mg total) by mouth 3 (three) times daily. 12/05/21  Yes Eliezer Lofts, FNP  Acetaminophen (TYLENOL 8 HOUR PO) Take 2 tablets by mouth as needed.    [provider]  atorvastatin (LIPITOR) 40 MG tablet Take 1 tablet (40 mg total) by mouth daily. 10/21/21   [place], Peter M, MD  calcium carbonate (TUMS - DOSED IN MG ELEMENTAL CALCIUM)  500 MG chewable tablet Chew 1 tablet by mouth daily as needed for indigestion or heartburn.    [provider]  Calcium Carbonate-Vitamin D (CALCIUM PLUS VITAMIN D PO) Take 1 tablet by mouth daily.     [provider]  carvedilol (COREG) 3.125 MG tablet Take 1 tablet (3.125 mg total) by mouth 2 (two) times daily with a meal. 03/05/17   [place], Peter M, MD  diphenhydrAMINE (BENADRYL) 25 MG tablet Take 25 mg by mouth at bedtime. Takes as needed    [provider]  Docusate Calcium (STOOL SOFTENER PO) Take 1-3 Doses by mouth daily.    [provider]  donepezil (ARICEPT) 10 MG tablet Take 1 tablet (10 mg total) by mouth at bedtime. Please call us to increase this dose after a 3 month period. 11/13/21   Lomax, Amy, NP  furosemide (LASIX) 40 MG tablet Take 1/2 (one-half) tablet by mouth once daily 10/28/21   [place], Peter M, MD  losartan (COZAAR) 25 MG tablet Take 1 tablet (25 mg total) by mouth daily. 09/24/20 12/23/20  [place], Peter M, MD  memantine (NAMENDA) 10 MG tablet Take 1 tablet (10 mg total) by mouth 2 (two) times daily. 11/13/21   Lomax, Amy, NP  metFORMIN (GLUCOPHAGE) 500  MG tablet Take 1 tablet (500 mg total) by mouth 2 (two) times daily with a meal. 03/22/18   [place], Peter M, MD  Multiple Vitamin (MULTIVITAMIN) tablet Take 1 tablet by mouth daily.    [provider]  nitroGLYCERIN (NITROSTAT) 0.4 MG SL tablet DISSOLVE ONE TABLET UNDER THE TONGUE EVERY 5 MINUTES AS NEEDED FOR CHEST PAIN.  DO NOT EXCEED A TOTAL OF 3 DOSES IN 15 MINUTES 03/28/21   [place], Peter M, MD  Omega-3 Fatty Acids (OMEGA 3 500 PO) Take 1 capsule by mouth daily.    [provider]  oxybutynin (DITROPAN-XL) 5 MG 24 hr tablet Take 5 mg by mouth 2 (two) times daily. 10/11/19   [provider]  potassium chloride SA (KLOR-CON M) 20 MEQ tablet TAKE 1  BY MOUTH ONCE DAILY 11/28/21   [place], Peter M, MD  Rivaroxaban (XARELTO) 15 MG TABS tablet Take 1 tablet (15 mg total) by mouth  daily with supper. 03/05/17   [place], Peter M, MD    Family History Family History  Problem Relation Age of Onset   Stroke Mother    Heart attack Father    Heart attack Brother    Neuropathy Brother    Colon cancer Neg Hx     Social History Social History   Tobacco Use   Smoking status: Never   Smokeless tobacco: Never  Vaping Use   Vaping Use: Never used  Substance Use Topics   Alcohol use: Yes    Comment: Occasional glass of wine   Drug use: No     Allergies   Patient has no known allergies.   Review of Systems Review of Systems  Skin:  Positive for rash.  All other systems reviewed and are negative.    Physical Exam Triage Vital Signs ED Triage Vitals  Enc Vitals Group     BP 12/05/21 1417 (!) 160/68     Pulse Rate 12/05/21 1417 60     Resp 12/05/21 1417 14     Temp 12/05/21 1417 98 F (36.7 C)     Temp Source 12/05/21 1417 Oral     SpO2 12/05/21 1417 100 %     Weight --      Height --      Head Circumference --      Peak Flow --      Pain Score 12/05/21 1419 0     Pain Loc --      Pain Edu? --      Excl. in [place]? --    No data found.  Updated Vital Signs BP (!) 160/68 (BP Location: Right Arm)   Pulse 60   Temp 98 F (36.7 C) (Oral)   Resp 14   SpO2 100%     Physical Exam Vitals and nursing note reviewed.  Constitutional:      Appearance: Normal appearance. He is normal weight.  HENT:     Head: Normocephalic and atraumatic.     Mouth/Throat:     Mouth: Mucous membranes are moist.     Pharynx: Oropharynx is clear.  Eyes:     Extraocular Movements: Extraocular movements intact.     Conjunctiva/sclera: Conjunctivae normal.     Pupils: Pupils are equal, round, and reactive to light.  Cardiovascular:     Rate and Rhythm: Normal rate and regular rhythm.     Pulses: Normal pulses.     Heart sounds: Normal heart sounds.  Pulmonary:     Effort: Pulmonary effort is normal.  Breath sounds: Normal breath sounds. No wheezing, rhonchi or  rales.  Musculoskeletal:     Cervical back: Normal range of motion and neck supple.  Skin:    General: Skin is warm and dry.     Comments: Left Hip: Painful grouped slightly raised erythematous vesicular lesions noted  Neurological:     General: No focal deficit present.     Mental Status: He is alert and oriented to person, place, and time.      UC Treatments / Results  Labs (all labs ordered are listed, but only abnormal results are displayed) Labs Reviewed - No data to display  EKG   Radiology No results found.  Procedures Procedures (including critical care time)  Medications Ordered in UC Medications - No data to display  Initial Impression / Assessment and Plan / UC Course  I have reviewed the triage vital signs and the nursing notes.  Pertinent labs & imaging results that were available during my care of the patient were reviewed by me and considered in my medical decision making (see chart for details).      MDM: 1.  Herpes zoster without complication-Rx'd Valtrex. Instructed patient to take medication as directed with food to completion.  Encouraged to increase daily water intake while taking this medication.  Advised patient/wife if symptoms worsen and/or unresolved please follow-up with PCP or here for further evaluation.  Patient discharged home, hemodynamically stable. Final Clinical Impressions(s) / UC Diagnoses   Final diagnoses:  Herpes zoster without complication     Discharge Instructions      Instructed patient to take medication as directed with food to completion.  Encouraged to increase daily water intake while taking this medication.  Advised patient/wife if symptoms worsen and/or unresolved please follow-up with PCP or here for further evaluation.     ED Prescriptions     Medication Sig Dispense Auth. Provider   valACYclovir (VALTREX) 1000 MG tablet Take 1 tablet (1,000 mg total) by mouth 3 (three) times daily. 21 tablet Eliezer Lofts,  FNP      PDMP not reviewed this encounter.   Eliezer Lofts, [place] 12/05/21 1455

## 2021-12-10 ENCOUNTER — Emergency Department
Admission: EM | Admit: 2021-12-10 | Discharge: 2021-12-10 | Disposition: A | Discharge status: Home / Self Care | Payer: Medicare Other | Source: Home / Self Care | Attending: Family Medicine | Admitting: Family Medicine

## 2021-12-10 DIAGNOSIS — M542 Cervicalgia: Secondary | ICD-10-CM | POA: Diagnosis not present

## 2021-12-10 NOTE — ED Triage Notes (Signed)
Pt states that he has some left sided neck pain. X2 days

## 2021-12-10 NOTE — Discharge Instructions (Signed)
May continue Aleve 2 tabs twice daily with food.  Apply ice pack for 15 to 20 minutes, 3 to 4 times daily  Continue until pain decreases.  Wear soft cervical collar for about 7 to 10 days.

## 2021-12-10 NOTE — ED Provider Notes (Signed)
[facility]    CSN: [id number] Arrival date & time: 12/10/21  1300      History   Chief Complaint Chief Complaint  Patient presents with   Neck Pain    Left side neck pain. X2 days    HPI Bruce Mann is a 88 y.o. male.   Patient complains of left-sided neck pain for two days without recent history of injury.  He feels well otherwise, denying paresthesias or distal weakness.  He apparently has had difficulty finding a comfortable position on his pillow at night.  The history is provided by the patient and a relative.    Past Medical History:  Diagnosis Date   Anemia    Atrial fibrillation (HCC) 2014   CAD (coronary artery disease)    CHF (congestive heart failure) (HCC) 03/2016   NEW ONSET   Diabetes mellitus type II 2005   Dyslipidemia    Erectile dysfunction    Gait instability    Hiatal hernia    Hyperlipidemia    Hypertension    Shingles 05/2017   Weakness 03/2016    Patient Active Problem List   Diagnosis Date Noted   Dementia without behavioral disturbance (HCC) 01/30/2020   Iron deficiency anemia 04/28/2016   Chronic diastolic CHF (congestive heart failure) (HCC) 04/16/2016   Anemia    AKI (acute kidney injury) (HCC)    Dyslipidemia    Hypertension    CAD (coronary artery disease)    Hyperlipidemia    Atrial fibrillation (HCC)     Past Surgical History:  Procedure Laterality Date   CARDIAC CATHETERIZATION     Ejection Fraction 60%   CATARACT EXTRACTION, BILATERAL     COLONOSCOPY     TONSILLECTOMY         Home Medications    Prior to Admission medications   Medication Sig Start Date End Date Taking? Authorizing Provider  Acetaminophen (TYLENOL 8 HOUR PO) Take 2 tablets by mouth as needed.   Yes [provider]  atorvastatin (LIPITOR) 40 MG tablet Take 1 tablet (40 mg total) by mouth daily. 10/21/21  Yes [place], Peter M, MD  calcium carbonate (TUMS - DOSED IN MG ELEMENTAL CALCIUM) 500 MG chewable tablet Chew 1  tablet by mouth daily as needed for indigestion or heartburn.   Yes [provider]  Calcium Carbonate-Vitamin D (CALCIUM PLUS VITAMIN D PO) Take 1 tablet by mouth daily.    Yes [provider]  carvedilol (COREG) 3.125 MG tablet Take 1 tablet (3.125 mg total) by mouth 2 (two) times daily with a meal. 03/05/17  Yes [place], Peter M, MD  diphenhydrAMINE (BENADRYL) 25 MG tablet Take 25 mg by mouth at bedtime. Takes as needed   Yes [provider]  Docusate Calcium (STOOL SOFTENER PO) Take 1-3 Doses by mouth daily.   Yes [provider]  donepezil (ARICEPT) 10 MG tablet Take 1 tablet (10 mg total) by mouth at bedtime. Please call us to increase this dose after a 3 month period. 11/13/21  Yes Lomax, Amy, NP  furosemide (LASIX) 40 MG tablet Take 1/2 (one-half) tablet by mouth once daily 10/28/21  Yes [place], Peter M, MD  memantine (NAMENDA) 10 MG tablet Take 1 tablet (10 mg total) by mouth 2 (two) times daily. 11/13/21  Yes Lomax, Amy, NP  metFORMIN (GLUCOPHAGE) 500 MG tablet Take 1 tablet (500 mg total) by mouth 2 (two) times daily with a meal. 03/22/18  Yes [place], Peter M, MD  Multiple Vitamin (MULTIVITAMIN)  tablet Take 1 tablet by mouth daily.   Yes [provider]  nitroGLYCERIN (NITROSTAT) 0.4 MG SL tablet DISSOLVE ONE TABLET UNDER THE TONGUE EVERY 5 MINUTES AS NEEDED FOR CHEST PAIN.  DO NOT EXCEED A TOTAL OF 3 DOSES IN 15 MINUTES 03/28/21  Yes [place], Peter M, MD  Omega-3 Fatty Acids (OMEGA 3 500 PO) Take 1 capsule by mouth daily.   Yes [provider]  oxybutynin (DITROPAN-XL) 5 MG 24 hr tablet Take 5 mg by mouth 2 (two) times daily. 10/11/19  Yes [provider]  potassium chloride SA (KLOR-CON M) 20 MEQ tablet TAKE 1  BY MOUTH ONCE DAILY 11/28/21  Yes [place], Peter M, MD  Rivaroxaban (XARELTO) 15 MG TABS tablet Take 1 tablet (15 mg total) by mouth daily with supper. 03/05/17  Yes [place], Peter M, MD  valACYclovir (VALTREX) 1000 MG tablet Take 1  tablet (1,000 mg total) by mouth 3 (three) times daily. 12/05/21  Yes Trevor Iha, FNP  losartan (COZAAR) 25 MG tablet Take 1 tablet (25 mg total) by mouth daily. 09/24/20 12/23/20  [place], Peter M, MD    Family History Family History  Problem Relation Age of Onset   Stroke Mother    Heart attack Father    Heart attack Brother    Neuropathy Brother    Colon cancer Neg Hx     Social History Social History   Tobacco Use   Smoking status: Never   Smokeless tobacco: Never  Vaping Use   Vaping Use: Never used  Substance Use Topics   Alcohol use: Yes    Comment: Occasional glass of wine   Drug use: No     Allergies   Patient has no known allergies.   Review of Systems Review of Systems  Constitutional:  Negative for activity change, appetite change, fatigue and fever.  HENT: Negative.    Eyes: Negative.   Respiratory: Negative.    Cardiovascular: Negative.   Gastrointestinal: Negative.   Genitourinary: Negative.   Musculoskeletal:  Positive for neck pain.  Skin:  Negative for rash.  Neurological:  Negative for dizziness, syncope, weakness, light-headedness, numbness and headaches.     Physical Exam Triage Vital Signs ED Triage Vitals  Enc Vitals Group     BP 12/10/21 1332 (!) 155/77     Pulse Rate 12/10/21 1332 87     Resp 12/10/21 1332 18     Temp 12/10/21 1332 97.9 F (36.6 C)     Temp Source 12/10/21 1332 Oral     SpO2 12/10/21 1332 98 %     Weight 12/10/21 1331 143 lb (64.9 kg)     Height 12/10/21 1331 5\' 8"  (1.727 m)     Head Circumference --      Peak Flow --      Pain Score 12/10/21 1330 6     Pain Loc --      Pain Edu? --      Excl. in [place]? --    No data found.  Updated Vital Signs BP (!) 155/77 (BP Location: Left Arm)   Pulse 87   Temp 97.9 F (36.6 C) (Oral)   Resp 18   Ht 5\' 8"  (1.727 m)   Wt 64.9 kg   SpO2 98%   BMI 21.74 kg/m   Visual Acuity Right Eye Distance:   Left Eye Distance:   Bilateral Distance:    Right Eye Near:    Left Eye Near:    Bilateral Near:  Physical Exam Vitals and nursing note reviewed.  Constitutional:      General: He is not in acute distress.    Appearance: He is not ill-appearing.  HENT:     Head: Normocephalic.     Right Ear: External ear normal.     Left Ear: External ear normal.     Nose: Nose normal.  Eyes:     Pupils: Pupils are equal, round, and reactive to light.  Neck:      Comments: There is mild tenderness to palpation over the left trapezius muscle and sternocleidomastoid muscle. Cardiovascular:     Rate and Rhythm: Normal rate.  Pulmonary:     Effort: Pulmonary effort is normal.  Musculoskeletal:     Cervical back: Neck supple. Tenderness present. No erythema or rigidity. Muscular tenderness present. No spinous process tenderness. Decreased range of motion.  Lymphadenopathy:     Cervical: No cervical adenopathy.  Neurological:     Mental Status: He is alert.      UC Treatments / Results  Labs (all labs ordered are listed, but only abnormal results are displayed) Labs Reviewed - No data to display  EKG   Radiology No results found.  Procedures Procedures (including critical care time)  Medications Ordered in UC Medications - No data to display  Initial Impression / Assessment and Plan / UC Course  I have reviewed the triage vital signs and the nursing notes.  Pertinent labs & imaging results that were available during my care of the patient were reviewed by me and considered in my medical decision making (see chart for details).    Review of past records reveals that patient had a CT C-spine without contrast done 02/09/21 after a fall.  Results were negative for acute fracture or dislocation.  There was moderate narrowing of the C5-6 interspace with posterior protrusion. There was multilevel facet DJD which may have accounted for mild grade 1 anterolisthesis C5-6. Will avoid ordering x-rays today because of no recent history of  injury. Dispensed soft cervical collar. Followup with Family Doctor if not improved in one week.   Final Clinical Impressions(s) / UC Diagnoses   Final diagnoses:  Cervical pain     Discharge Instructions      May continue Aleve 2 tabs twice daily with food.  Apply ice pack for 15 to 20 minutes, 3 to 4 times daily  Continue until pain decreases.  Wear soft cervical collar for about 7 to 10 days.    ED Prescriptions   None       Lattie HawBeese, Gracianna Vink A, MD 12/11/21 1153

## 2021-12-20 ENCOUNTER — Other Ambulatory Visit: Payer: Self-pay | Admitting: Cardiology

## 2021-12-23 ENCOUNTER — Other Ambulatory Visit: Payer: Self-pay

## 2022-01-28 ENCOUNTER — Encounter: Payer: Self-pay | Admitting: Cardiology

## 2022-01-29 ENCOUNTER — Other Ambulatory Visit: Payer: Self-pay

## 2022-01-29 MED ORDER — LOSARTAN POTASSIUM 50 MG PO TABS: 50.0000 mg | ORAL_TABLET | Freq: Every day | ORAL | 3 refills | Status: DC

## 2022-01-29 NOTE — Telephone Encounter (Signed)
Spoke to patient's wife.Advised to finish Losartan 25 mg take 2 tablets daily.New prescription for 50 mg take 1 tablet daily sent to pharmacy.

## 2022-05-17 ENCOUNTER — Ambulatory Visit
Admission: EM | Admit: 2022-05-17 | Discharge: 2022-05-17 | Disposition: A | Discharge status: Home / Self Care | Payer: Medicare Other | Attending: Family Medicine | Admitting: Family Medicine

## 2022-05-17 ENCOUNTER — Other Ambulatory Visit: Payer: Self-pay

## 2022-05-17 DIAGNOSIS — M542 Cervicalgia: Secondary | ICD-10-CM | POA: Diagnosis not present

## 2022-05-17 MED ORDER — TRAMADOL HCL 50 MG PO TABS: 50.0000 mg | ORAL_TABLET | Freq: Three times a day (TID) | ORAL | 0 refills | Status: AC | PRN

## 2022-05-17 NOTE — ED Triage Notes (Addendum)
Pt here today with wife who says he seemed more confused than normal. HX age related memory loss. Also c/o LT sided neck pain. States he appeared to be breathing shallow and rapidly before bringing him. Told wife "he felt like he was going to die" Didn't have breakfast this am. Neighbor who is a Materials engineer checked him out and stated ED probably wasn't necessary, could take to UC for eval. Gave banana on the way here as well as nitro at 230p and breathing seems to have improved since.

## 2022-05-17 NOTE — ED Notes (Signed)
Pt alert and oriented to self only, which wife reports as his norm lately. Resp are regular and unlabored, VSS. Pt able to move all four extremities, although L hand grip is slightly stronger than right hand. Smile is symmetrical and tongue midline.

## 2022-05-17 NOTE — Discharge Instructions (Addendum)
Advised patient/wife may take Tramadol daily or as needed for neck pain.  Encouraged increase daily water intake while taking this medication.  Patient's wife present has been counseled on sedative properties of this medication.  Advised if symptoms worsen and/or unresolved please follow-up with PCP or here for further evaluation.

## 2022-05-17 NOTE — ED Provider Notes (Signed)
[facility]    CSN: [id number] Arrival date & time: 05/17/22  1520      History   Chief Complaint Chief Complaint  Patient presents with   Shortness of Breath   Neck Pain    HPI Bruce Mann is a 88 y.o. male.   HPI [name] PRESENTS WITH CONFUSION LEFT-SIDED NECK PAIN AND SHORTNESS OF BREATH SINCE EARLIER THIS MORNING.  WIFE REPORTS THAT HE DID NOT HAVE BREAKFAST.  WIFE REPORTS THAT HER HUSBAND SAID HE FELT LIKE HE WAS GOING TO DIE THIS MORNING.  REPORTS NEXT-DOOR NEIGHBOR IS AN RN AT ED AND CHECKED HIM OUT AND BELIEVE THAT HE SHOULD GO TO URGENT CARE FOR FURTHER EVALUATION.  PMH significant for A-fib, new onset CHF, and CAD.  Past Medical History:  Diagnosis Date   Anemia    Atrial fibrillation ([place]) 2014   CAD (coronary artery disease)    CHF (congestive heart failure) ([place]) 03/2016   NEW ONSET   Diabetes mellitus type II 2005   Dyslipidemia    Erectile dysfunction    Gait instability    Hiatal hernia    Hyperlipidemia    Hypertension    Shingles 05/2017   Weakness 03/2016    Patient Active Problem List   Diagnosis Date Noted   Dementia without behavioral disturbance ([place]) 01/30/2020   Iron deficiency anemia 04/28/2016   Chronic diastolic CHF (congestive heart failure) ([place]) 04/16/2016   Anemia    AKI (acute kidney injury) ([place])    Dyslipidemia    Hypertension    CAD (coronary artery disease)    Hyperlipidemia    Atrial fibrillation (HCC)     Past Surgical History:  Procedure Laterality Date   CARDIAC CATHETERIZATION     Ejection Fraction 60%   CATARACT EXTRACTION, BILATERAL     COLONOSCOPY     TONSILLECTOMY         Home Medications    Prior to Admission medications   Medication Sig Start Date End Date Taking? Authorizing Provider  traMADol (ULTRAM) 50 MG tablet Take 1 tablet (50 mg total) by mouth every 8 (eight) hours as needed. 05/17/22  Yes Eliezer Lofts, FNP  Acetaminophen (TYLENOL 8 HOUR PO) Take 2  tablets by mouth as needed.    [provider]  atorvastatin (LIPITOR) 40 MG tablet Take 1 tablet (40 mg total) by mouth daily. 10/21/21   [place], Peter M, MD  calcium carbonate (TUMS - DOSED IN MG ELEMENTAL CALCIUM) 500 MG chewable tablet Chew 1 tablet by mouth daily as needed for indigestion or heartburn.    [provider]  Calcium Carbonate-Vitamin D (CALCIUM PLUS VITAMIN D PO) Take 1 tablet by mouth daily.     [provider]  carvedilol (COREG) 3.125 MG tablet Take 1 tablet (3.125 mg total) by mouth 2 (two) times daily with a meal. 03/05/17   [place], Peter M, MD  diphenhydrAMINE (BENADRYL) 25 MG tablet Take 25 mg by mouth at bedtime. Takes as needed    [provider]  Docusate Calcium (STOOL SOFTENER PO) Take 1-3 Doses by mouth daily.    [provider]  donepezil (ARICEPT) 10 MG tablet Take 1 tablet (10 mg total) by mouth at bedtime. Please call us to increase this dose after a 3 month period. 11/13/21   Lomax, Amy, NP  furosemide (LASIX) 40 MG tablet Take 1/2 (one-half) tablet by mouth once daily 10/28/21   [place], Peter M, MD  losartan (COZAAR) 50 MG tablet  Take 1 tablet (50 mg total) by mouth daily. 01/29/22 01/24/23  [place], Peter M, MD  memantine (NAMENDA) 10 MG tablet Take 1 tablet (10 mg total) by mouth 2 (two) times daily. 11/13/21   Lomax, Amy, NP  metFORMIN (GLUCOPHAGE) 500 MG tablet Take 1 tablet (500 mg total) by mouth 2 (two) times daily with a meal. 03/22/18   [place], Peter M, MD  Multiple Vitamin (MULTIVITAMIN) tablet Take 1 tablet by mouth daily.    [provider]  nitroGLYCERIN (NITROSTAT) 0.4 MG SL tablet DISSOLVE ONE TABLET UNDER THE TONGUE EVERY 5 MINUTES AS NEEDED FOR CHEST PAIN.  DO NOT EXCEED A TOTAL OF 3 DOSES IN 15 MINUTES 03/28/21   [place], Peter M, MD  Omega-3 Fatty Acids (OMEGA 3 500 PO) Take 1 capsule by mouth daily.    [provider]  oxybutynin (DITROPAN-XL) 5 MG 24 hr tablet Take 5 mg by mouth 2 (two) times  daily. 10/11/19   [provider]  potassium chloride SA (KLOR-CON M) 20 MEQ tablet TAKE 1  BY MOUTH ONCE DAILY 11/28/21   [place], Peter M, MD  Rivaroxaban (XARELTO) 15 MG TABS tablet Take 1 tablet (15 mg total) by mouth daily with supper. 03/05/17   [place], Peter M, MD  valACYclovir (VALTREX) 1000 MG tablet Take 1 tablet (1,000 mg total) by mouth 3 (three) times daily. 12/05/21   Eliezer Lofts, FNP    Family History Family History  Problem Relation Age of Onset   Stroke Mother    Heart attack Father    Heart attack Brother    Neuropathy Brother    Colon cancer Neg Hx     Social History Social History   Tobacco Use   Smoking status: Never   Smokeless tobacco: Never  Vaping Use   Vaping Use: Never used  Substance Use Topics   Alcohol use: Yes    Comment: Occasional glass of wine   Drug use: No     Allergies   Patient has no known allergies.   Review of Systems Review of Systems   Physical Exam Triage Vital Signs ED Triage Vitals  Enc Vitals Group     BP      Pulse      Resp      Temp      Temp src      SpO2      Weight      Height      Head Circumference      Peak Flow      Pain Score      Pain Loc      Pain Edu?      Excl. in [place]?    No data found.  Updated Vital Signs BP (!) 168/76 (BP Location: Left Arm)   Pulse (!) 58   Temp (!) 97.4 F (36.3 C) (Oral)   Resp 17   SpO2 98%    Physical Exam Vitals and nursing note reviewed.  Constitutional:      General: He is not in acute distress.    Appearance: Normal appearance. He is normal weight. He is not ill-appearing.  HENT:     Head: Normocephalic and atraumatic.     Right Ear: Tympanic membrane, ear canal and external ear normal.     Left Ear: Tympanic membrane, ear canal and external ear normal.     Nose: Nose normal.     Mouth/Throat:     Mouth: Mucous membranes are moist.  Pharynx: Oropharynx is clear.  Eyes:     Extraocular Movements: Extraocular movements intact.      Conjunctiva/sclera: Conjunctivae normal.     Pupils: Pupils are equal, round, and reactive to light.  Neck:     Comments: Patient reporting neck pain left-sided, limited range of motion noted Cardiovascular:     Rate and Rhythm: Normal rate and regular rhythm.     Pulses: Normal pulses.     Heart sounds: Normal heart sounds.  Pulmonary:     Effort: Pulmonary effort is normal.     Breath sounds: Normal breath sounds. No wheezing, rhonchi or rales.  Musculoskeletal:        General: Normal range of motion.  Lymphadenopathy:     Cervical: No cervical adenopathy.  Skin:    General: Skin is warm and dry.  Neurological:     General: No focal deficit present.     Mental Status: He is alert and oriented to person, place, and time. Mental status is at baseline.     Motor: No weakness.     Comments: Patient ambulating well with a four-point rolling walker.      UC Treatments / Results  Labs (all labs ordered are listed, but only abnormal results are displayed) Labs Reviewed - No data to display  EKG   Radiology No results found.  Procedures Procedures (including critical care time)  Medications Ordered in UC Medications - No data to display  Initial Impression / Assessment and Plan / UC Course  I have reviewed the triage vital signs and the nursing notes.  Pertinent labs & imaging results that were available during my care of the patient were reviewed by me and considered in my medical decision making (see chart for details).    MDM: 1.  Cervicalgia-Rx'd tramadol. Advised patient/wife may take Tramadol daily or as needed for neck pain. Encouraged increase daily water intake while taking this medication.  Patient's wife present has been counseled on sedative properties of this medication.  Advised if symptoms worsen and/or unresolved please follow-up with PCP or here for further evaluation.  Patient discharged home, hemodynamically stable. Final Clinical Impressions(s) / UC  Diagnoses   Final diagnoses:  Cervicalgia     Discharge Instructions      Advised patient/wife may take Tramadol daily or as needed for neck pain.  Encouraged increase daily water intake while taking this medication.  Patient's wife present has been counseled on sedative properties of this medication.  Advised if symptoms worsen and/or unresolved please follow-up with PCP or here for further evaluation.     ED Prescriptions     Medication Sig Dispense Auth. Provider   traMADol (ULTRAM) 50 MG tablet Take 1 tablet (50 mg total) by mouth every 8 (eight) hours as needed. 15 tablet Trevor Iha, FNP      I have reviewed the PDMP during this encounter.   Trevor Iha, FNP 05/17/22 [phone]

## 2022-06-04 ENCOUNTER — Encounter: Payer: Self-pay | Admitting: Cardiology

## 2022-06-04 MED ORDER — LOSARTAN POTASSIUM 100 MG PO TABS: 100.0000 mg | ORAL_TABLET | Freq: Every day | ORAL | 3 refills | Status: DC

## 2022-06-04 NOTE — Telephone Encounter (Signed)
Medication samples have been provided to the patient.  Drug name: Xarelto 15mg  Qty: 3 bottles LOT: Exp.Date: 08/2023  Samples left at front desk for patient pick-up. Patient notified.

## 2022-07-29 ENCOUNTER — Emergency Department (HOSPITAL_COMMUNITY): Payer: Medicare Other

## 2022-07-29 ENCOUNTER — Encounter (HOSPITAL_COMMUNITY): Payer: Self-pay | Admitting: Emergency Medicine

## 2022-07-29 ENCOUNTER — Emergency Department (HOSPITAL_COMMUNITY)
Admission: EM | Admit: 2022-07-29 | Discharge: 2022-07-29 | Disposition: A | Discharge status: Home / Self Care | Payer: Medicare Other | Attending: Emergency Medicine | Admitting: Emergency Medicine

## 2022-07-29 ENCOUNTER — Other Ambulatory Visit: Payer: Self-pay

## 2022-07-29 DIAGNOSIS — F0390 Unspecified dementia without behavioral disturbance: Secondary | ICD-10-CM | POA: Insufficient documentation

## 2022-07-29 DIAGNOSIS — Z7901 Long term (current) use of anticoagulants: Secondary | ICD-10-CM | POA: Insufficient documentation

## 2022-07-29 DIAGNOSIS — R079 Chest pain, unspecified: Secondary | ICD-10-CM | POA: Diagnosis not present

## 2022-07-29 DIAGNOSIS — I2510 Atherosclerotic heart disease of native coronary artery without angina pectoris: Secondary | ICD-10-CM | POA: Insufficient documentation

## 2022-07-29 DIAGNOSIS — R072 Precordial pain: Secondary | ICD-10-CM | POA: Diagnosis present

## 2022-07-29 DIAGNOSIS — Z79899 Other long term (current) drug therapy: Secondary | ICD-10-CM | POA: Diagnosis not present

## 2022-07-29 DIAGNOSIS — I509 Heart failure, unspecified: Secondary | ICD-10-CM | POA: Diagnosis not present

## 2022-07-29 HISTORY — DX: Unspecified dementia, unspecified severity, without behavioral disturbance, psychotic disturbance, mood disturbance, and anxiety: F03.90

## 2022-07-29 LAB — BASIC METABOLIC PANEL
Anion gap: 9 (ref 5–15)
BUN: 29 mg/dL — ABNORMAL HIGH (ref 8–23)
CO2: 22 mmol/L (ref 22–32)
Calcium: 9.5 mg/dL (ref 8.9–10.3)
Chloride: 107 mmol/L (ref 98–111)
Creatinine, Ser: 1.01 mg/dL (ref 0.61–1.24)
GFR, Estimated: >60 mL/min (ref >60)
Glucose, Bld: 111 mg/dL — ABNORMAL HIGH (ref 70–99)
Potassium: 4.7 mmol/L (ref 3.5–5.1)
Sodium: 138 mmol/L (ref 135–145)

## 2022-07-29 LAB — CBC
HCT: 40.3 % (ref 39.0–52.0)
Hemoglobin: 12.7 g/dL — ABNORMAL LOW (ref 13.0–17.0)
MCH: 32.2 pg (ref 26.0–34.0)
MCHC: 31.5 g/dL (ref 30.0–36.0)
MCV: 102 fL — ABNORMAL HIGH (ref 80.0–100.0)
Platelets: 120 10*3/uL — ABNORMAL LOW (ref 150–400)
RBC: 3.95 MIL/uL — ABNORMAL LOW (ref 4.22–5.81)
RDW: 13.8 % (ref 11.5–15.5)
WBC: 8.1 10*3/uL (ref 4.0–10.5)
nRBC: 0 % (ref 0.0–0.2)

## 2022-07-29 LAB — TROPONIN I (HIGH SENSITIVITY)
Troponin I (High Sensitivity): 22 ng/L — ABNORMAL HIGH (ref <18)
Troponin I (High Sensitivity): 23 ng/L — ABNORMAL HIGH (ref <18)

## 2022-07-29 LAB — BRAIN NATRIURETIC PEPTIDE: B Natriuretic Peptide: 395.2 pg/mL — ABNORMAL HIGH (ref 0.0–100.0)

## 2022-07-29 NOTE — ED Triage Notes (Signed)
Patient arrived with EMS from home spouse reported central chest pain this evening with mild SOB , he received ASA 324 mg and 3 NTG sl prior to arrival , history of CHF/Afib . CBG=112

## 2022-07-29 NOTE — Discharge Instructions (Signed)
You were seen in the emergency department for an sided chest pain.  You had lab work EKG and chest x-ray that did not show any evidence of heart attack.  Please continue your regular medications and follow-up with your primary care doctor and cardiologist.  Return to the emergency department if any worsening or concerning symptoms

## 2022-07-29 NOTE — ED Provider Notes (Signed)
[facility] Provider Note   CSN: [id number] Arrival date & time: 07/29/22  0351     History  Chief Complaint  Patient presents with   Chest Pain    Welsh is a 89 y.o. male.  Level 5 caveat secondary to dementia.  88 year old male brought in by ambulance for evaluation of chest pain.  His wife is giving most of the history.  She said he got up to go to the bathroom earlier this morning and when he returned he was agitated.  She given the Benadryl which sometimes helps.  He then began experiencing some pain in his chest and holding his fist to his chest.  She tried nitroglycerin without any improvement.  EMS was called and at that point he denied any pain in his chest but had some discomfort in his neck.  Currently he denies any chest pain and has no recollection of the episode.  He denies any cough or shortness of breath.  Wife states he is back at his baseline.  The history is provided by the patient and the spouse.  Chest Pain Pain location:  Substernal area Progression:  Resolved Chronicity:  New Context: at rest   Relieved by:  Nothing Worsened by:  Nothing Ineffective treatments:  Nitroglycerin Risk factors: coronary artery disease   Risk factors: no smoking        Home Medications Prior to Admission medications   Medication Sig Start Date End Date Taking? Authorizing Provider  Acetaminophen (TYLENOL 8 HOUR PO) Take 2 tablets by mouth as needed.    [provider]  atorvastatin (LIPITOR) 40 MG tablet Take 1 tablet (40 mg total) by mouth daily. 10/21/21   Martinique, Peter M, MD  calcium carbonate (TUMS - DOSED IN MG ELEMENTAL CALCIUM) 500 MG chewable tablet Chew 1 tablet by mouth daily as needed for indigestion or heartburn.    [provider]  Calcium Carbonate-Vitamin D (CALCIUM PLUS VITAMIN D PO) Take 1 tablet by mouth daily.     [provider]  carvedilol (COREG) 3.125 MG tablet Take 1 tablet  (3.125 mg total) by mouth 2 (two) times daily with a meal. 03/05/17   Martinique, Peter M, MD  diphenhydrAMINE (BENADRYL) 25 MG tablet Take 25 mg by mouth at bedtime. Takes as needed    [provider]  Docusate Calcium (STOOL SOFTENER PO) Take 1-3 Doses by mouth daily.    [provider]  donepezil (ARICEPT) 10 MG tablet Take 1 tablet (10 mg total) by mouth at bedtime. Please call us to increase this dose after a 3 month period. 11/13/21   Lomax, Amy, NP  furosemide (LASIX) 40 MG tablet Take 1/2 (one-half) tablet by mouth once daily 10/28/21   Martinique, Peter M, MD  losartan (COZAAR) 100 MG tablet Take 1 tablet (100 mg total) by mouth daily. 06/04/22   Martinique, Peter M, MD  memantine (NAMENDA) 10 MG tablet Take 1 tablet (10 mg total) by mouth 2 (two) times daily. 11/13/21   Lomax, Amy, NP  metFORMIN (GLUCOPHAGE) 500 MG tablet Take 1 tablet (500 mg total) by mouth 2 (two) times daily with a meal. 03/22/18   Martinique, Peter M, MD  Multiple Vitamin (MULTIVITAMIN) tablet Take 1 tablet by mouth daily.    [provider]  nitroGLYCERIN (NITROSTAT) 0.4 MG SL tablet DISSOLVE ONE TABLET UNDER THE TONGUE EVERY 5 MINUTES AS NEEDED FOR CHEST PAIN.  DO NOT EXCEED A TOTAL OF 3 DOSES  IN 15 MINUTES 03/28/21   Martinique, Peter M, MD  Omega-3 Fatty Acids (OMEGA 3 500 PO) Take 1 capsule by mouth daily.    [provider]  oxybutynin (DITROPAN-XL) 5 MG 24 hr tablet Take 5 mg by mouth 2 (two) times daily. 10/11/19   [provider]  potassium chloride SA (KLOR-CON M) 20 MEQ tablet TAKE 1  BY MOUTH ONCE DAILY 11/28/21   Martinique, Peter M, MD  Rivaroxaban (XARELTO) 15 MG TABS tablet Take 1 tablet (15 mg total) by mouth daily with supper. 03/05/17   Martinique, Peter M, MD  traMADol (ULTRAM) 50 MG tablet Take 1 tablet (50 mg total) by mouth every 8 (eight) hours as needed. 05/17/22   Eliezer Lofts, FNP  valACYclovir (VALTREX) 1000 MG tablet Take 1 tablet (1,000 mg total) by mouth 3 (three) times daily. 12/05/21    Eliezer Lofts, FNP      Allergies    Patient has no known allergies.    Review of Systems   Review of Systems  Unable to perform ROS: Dementia  Cardiovascular:  Positive for chest pain.    Physical Exam Updated Vital Signs BP (!) 159/67 (BP Location: Right Arm)   Pulse 66   Temp 97.7 F (36.5 C) (Oral)   Resp 19   SpO2 95%  Physical Exam Vitals and nursing note reviewed.  Constitutional:      General: He is not in acute distress.    Appearance: He is well-developed.  HENT:     Head: Normocephalic and atraumatic.  Eyes:     Conjunctiva/sclera: Conjunctivae normal.  Cardiovascular:     Rate and Rhythm: Normal rate. Rhythm irregular.     Heart sounds: Normal heart sounds. No murmur heard. Pulmonary:     Effort: Pulmonary effort is normal. No respiratory distress.     Breath sounds: Normal breath sounds.  Abdominal:     Palpations: Abdomen is soft.     Tenderness: There is no abdominal tenderness.  Musculoskeletal:        General: No swelling. Normal range of motion.     Cervical back: Neck supple.     Right lower leg: No tenderness. No edema.     Left lower leg: No tenderness. No edema.  Skin:    General: Skin is warm and dry.     Capillary Refill: Capillary refill takes less than 2 seconds.  Neurological:     General: No focal deficit present.     Mental Status: He is alert. He is disoriented.     ED Results / Procedures / Treatments   Labs (all labs ordered are listed, but only abnormal results are displayed) Labs Reviewed  BASIC METABOLIC PANEL - Abnormal; Notable for the following components:      Result Value   Glucose, Bld 111 (*)    BUN 29 (*)    All other components within normal limits  CBC - Abnormal; Notable for the following components:   RBC 3.95 (*)    Hemoglobin 12.7 (*)    MCV 102.0 (*)    Platelets 120 (*)    All other components within normal limits  BRAIN NATRIURETIC PEPTIDE - Abnormal; Notable for the following components:   B  Natriuretic Peptide 395.2 (*)    All other components within normal limits  TROPONIN I (HIGH SENSITIVITY) - Abnormal; Notable for the following components:   Troponin I (High Sensitivity) 22 (*)    All other components within normal limits  TROPONIN I (HIGH  SENSITIVITY) - Abnormal; Notable for the following components:   Troponin I (High Sensitivity) 23 (*)    All other components within normal limits    EKG EKG Interpretation  Date/Time:  Tuesday July 29 2022 03:52:43 EST Ventricular Rate:  69 PR Interval:  256 QRS Duration: 78 QT Interval:  408 QTC Calculation: 437 R Axis:   92 Text Interpretation: Atrial fibrillation with premature ventricular or aberrantly conducted complexes Rightward axis Nonspecific ST and T wave abnormality Abnormal ECG When compared with ECG of 29-Jul-2022 03:40,  improved ischemic changes now Confirmed by Aletta Edouard [phone]) on 07/29/2022 11:04:25 AM  Radiology DG Chest 2 View  Result Date: 07/29/2022 CLINICAL DATA:  Chest pain and shortness of breath. EXAM: CHEST - 2 VIEW COMPARISON:  February 09, 2021 FINDINGS: The cardiac silhouette is mildly enlarged and unchanged in size. There is moderate to marked severity calcification of the thoracic aorta. Mild atelectasis is seen within the retrocardiac region of the left lung base and lateral aspect of the right lung base. There is no evidence of a pleural effusion or pneumothorax. Multilevel degenerative changes seen throughout the thoracic spine with moderate severity levoscoliosis of the lower thoracic and upper lumbar spine. IMPRESSION: Stable cardiomegaly with mild bibasilar atelectasis. Electronically Signed   By: Virgina Norfolk M.D.   On: 07/29/2022 06:14    Procedures Procedures    Medications Ordered in ED Medications - No data to display  ED Course/ Medical Decision Making/ A&P                             Medical Decision Making  This patient complains of chest pain earlier today; this  involves an extensive number of treatment Options and is a complaint that carries with it a high risk of complications and morbidity. The differential includes ACS, pneumonia, pneumothorax, PE, vascular, reflux  I ordered, reviewed and interpreted labs, which included CBC with normal white count, hemoglobin low down from priors, chemistries unremarkable, troponins flat, BNP elevated better than priors I ordered imaging studies which included chest x-ray and I independently    visualized and interpreted imaging which showed cardiomegaly no acute findings Additional history obtained from patient's wife Previous records obtained and reviewed in epic no recent admissions  Cardiac monitoring reviewed, normal sinus rhythm Social determinants considered, no significant barriers Critical Interventions: None  After the interventions stated above, I reevaluated the patient and found patient to be awake alert in no distress.  Not having any active chest pain.  Recommended close follow-up with PCP. Admission and further testing considered, no indications for admission or further workup at this time.  Will have follow-up with PCP and return instructions discussed         Final Clinical Impression(s) / ED Diagnoses Final diagnoses:  Nonspecific chest pain    Rx / DC Orders ED Discharge Orders     None         Hayden Rasmussen, MD 07/29/22 1744

## 2022-07-29 NOTE — ED Provider Triage Note (Signed)
Emergency Medicine Provider Triage Evaluation Note  Bruce Mann , a 89 y.o. male  was evaluated in triage.  Pt with history of dementia, denies any current complaints. According to EMS, patient complained to family of chest pain and shortness of breath and they were concerned for same. Patient states that he remembers being short of breath but not the chest pain. States that his symptoms have resolved and he is currently asymptomatic. Patient alert to self only which is baseline.  Review of Systems  Positive:  Negative:   Physical Exam  BP (!) 182/67 (BP Location: Right Arm)   Pulse (!) 56   Temp 98 F (36.7 C) (Oral)   Resp 20   SpO2 100%  Gen:   Awake, no distress   Resp:  Normal effort  MSK:   Moves extremities without difficulty  Other:    Medical Decision Making  Medically screening exam initiated at 4:14 AM.  Appropriate orders placed.  [name] was informed that the remainder of the evaluation will be completed by another provider, this initial triage assessment does not replace that evaluation, and the importance of remaining in the ED until their evaluation is complete.     [name], PA-C 07/29/22 [phone]

## 2022-08-03 ENCOUNTER — Other Ambulatory Visit: Payer: Self-pay | Admitting: Cardiology

## 2022-08-27 ENCOUNTER — Encounter: Payer: Self-pay | Admitting: Cardiology

## 2022-08-27 MED ORDER — RIVAROXABAN 15 MG PO TABS: 15.0000 mg | ORAL_TABLET | Freq: Two times a day (BID) | ORAL | 0 refills | Status: DC

## 2022-09-15 ENCOUNTER — Telehealth: Payer: Self-pay | Admitting: Cardiology

## 2022-09-15 DIAGNOSIS — I2510 Atherosclerotic heart disease of native coronary artery without angina pectoris: Secondary | ICD-10-CM

## 2022-09-15 NOTE — Telephone Encounter (Signed)
*  STAT* If patient is at the pharmacy, call can be transferred to refill team.   1. Which medications need to be refilled? (please list name of each medication and dose if known) nitroGLYCERIN (NITROSTAT) 0.4 MG SL tablet  2. Which pharmacy/location (including street and city if local pharmacy) is medication to be sent to? [facility] - , [place] - [address]  3. Do they need a 30 day or 90 day supply? 30 day  

## 2022-09-17 MED ORDER — NITROGLYCERIN 0.4 MG SL SUBL
SUBLINGUAL_TABLET | SUBLINGUAL | 3 refills | Status: AC
Start: 2022-09-17 — End: ?

## 2022-09-22 ENCOUNTER — Encounter: Payer: Self-pay | Admitting: Cardiology

## 2022-09-22 MED ORDER — AMLODIPINE BESYLATE 2.5 MG PO TABS: 2.5000 mg | ORAL_TABLET | Freq: Every day | ORAL | 3 refills | Status: DC

## 2022-09-30 ENCOUNTER — Encounter (HOSPITAL_BASED_OUTPATIENT_CLINIC_OR_DEPARTMENT_OTHER): Payer: Self-pay | Admitting: Family

## 2022-09-30 ENCOUNTER — Ambulatory Visit (HOSPITAL_BASED_OUTPATIENT_CLINIC_OR_DEPARTMENT_OTHER): Payer: Medicare Other | Admitting: Family

## 2022-09-30 VITALS — BP 135/70 | HR 51 | Ht 68.0 in | Wt 147.5 lb

## 2022-09-30 DIAGNOSIS — I10 Essential (primary) hypertension: Secondary | ICD-10-CM | POA: Diagnosis not present

## 2022-09-30 DIAGNOSIS — I25118 Atherosclerotic heart disease of native coronary artery with other forms of angina pectoris: Secondary | ICD-10-CM

## 2022-09-30 DIAGNOSIS — E785 Hyperlipidemia, unspecified: Secondary | ICD-10-CM | POA: Diagnosis not present

## 2022-09-30 DIAGNOSIS — I5032 Chronic diastolic (congestive) heart failure: Secondary | ICD-10-CM | POA: Diagnosis not present

## 2022-09-30 DIAGNOSIS — D6859 Other primary thrombophilia: Secondary | ICD-10-CM

## 2022-09-30 DIAGNOSIS — I4821 Permanent atrial fibrillation: Secondary | ICD-10-CM

## 2022-09-30 MED ORDER — FUROSEMIDE 20 MG PO TABS: 20.0000 mg | ORAL_TABLET | Freq: Every day | ORAL | 3 refills | Status: DC

## 2022-09-30 MED ORDER — POTASSIUM CHLORIDE ER 10 MEQ PO CPCR: 10.0000 meq | ORAL_CAPSULE | Freq: Every day | ORAL | 3 refills | Status: DC

## 2022-09-30 MED ORDER — VALSARTAN 160 MG PO TABS: 160.0000 mg | ORAL_TABLET | Freq: Every day | ORAL | 3 refills | Status: DC

## 2022-09-30 NOTE — Patient Instructions (Addendum)
Medication Instructions:  Your physician has recommended you make the following change in your medication:   Stop: losartan   Start: Valsartan 160mg  daily   Change: lasix 20mg  daily ( 1 tablet)   Start: Potassium 61meq daily (micro size)   Follow-Up: At [facility], you and your health needs are our priority.  As part of our continuing mission to provide you with exceptional heart care, we have created designated Provider Care Teams.  These Care Teams include your primary Cardiologist (physician) and Advanced Practice Providers (APPs -  Physician Assistants and Nurse Practitioners) who all work together to provide you with the care you need, when you need it.  We recommend signing up for the patient portal called "MyChart".  Sign up information is provided on this After Visit Summary.  MyChart is used to connect with patients for Virtual Visits (Telemedicine).  Patients are able to view lab/test results, encounter notes, upcoming appointments, etc.  Non-urgent messages can be sent to your provider as well.   To learn more about what you can do with MyChart, go to [email or web address].    Your next appointment:   3-4 month(s) in person or virtual with Dr. Martinique, Laurann Montana, NP or [facility]

## 2022-09-30 NOTE — Progress Notes (Signed)
Office Visit    Patient Name: Bruce Mann Date of Encounter: 09/30/2022  PCP:  Haywood Pao, MD   [facility]  Cardiologist:  Peter Martinique, MD  Advanced Practice Provider:  No care team member to display Electrophysiologist:  None      Chief Complaint    Bruce Mann is a 89 y.o. male presents today for hypertension   Past Medical History    Past Medical History:  Diagnosis Date   Anemia    Atrial fibrillation 2014   CAD (coronary artery disease)    CHF (congestive heart failure) 03/2016   NEW ONSET   Dementia    Diabetes mellitus type II 2005   Dyslipidemia    Erectile dysfunction    Gait instability    Hiatal hernia    Hyperlipidemia    Hypertension    Shingles 05/2017   Weakness 03/2016   Past Surgical History:  Procedure Laterality Date   CARDIAC CATHETERIZATION     Ejection Fraction 60%   CATARACT EXTRACTION, BILATERAL     COLONOSCOPY     TONSILLECTOMY      Allergies  No Known Allergies  History of Present Illness    Bruce Mann is a 89 y.o. male with a hx of CAD s/p angioplasty of OM1 in 2001, hypertension, diastolic heart failure, permanent atrial fibrillation last seen 10/01/21 with Dr. Martinique.  Last seen 09/2421.  Heart rate was well-controlled with carvedilol and low-dose Xarelto continued.  BP was acceptable and lipid panel at goal.  To reduce polypharmacy was recommended to stop fenofibrate and reduce Lipitor to 40 mg daily.  His wife contact the office noting elevated blood pressure readings.  Presents today for follow-up with his wife who assist with history taking. He is taking Losartan with his evening pills. The Amlodipine has been taking in the morning with his Carvedilol, Lasix.  Brings BP log from home with readings over the last 3 weeks as low as 121/60 and as high as 179/85.  Average reading 150/52.   Reports no shortness of breath nor dyspnea on exertion. Reports no chest pain, pressure, or  tightness. No edema, orthopnea, PND. Reports no palpitations.    EKGs/Labs/Other Studies Reviewed:   The following studies were reviewed today: Cardiac Studies & Procedures       ECHOCARDIOGRAM  ECHOCARDIOGRAM COMPLETE 04/17/2016  Narrative *[facility]* [address],  [address] [phone]  ------------------------------------------------------------------- Transthoracic Echocardiography  Patient:    Riko, Muffler MR #:       [id number] Study Date: 04/17/2016 Gender:     M Age:        32 Height:     177.8 cm Weight:     74.8 kg BSA:        1.93 m^2 Pt. Status: Room:       3E05C  SONOGRAPHER  Rhona Raider PERFORMING   [facility], Inpatient ATTENDING    Tomi Bamberger, Md  cc:  ------------------------------------------------------------------- LV EF: 55% -   60%  ------------------------------------------------------------------- Indications:      CHF - 428.0.  ------------------------------------------------------------------- History:   PMH:   Atrial fibrillation.  Coronary artery disease. Risk factors:  Hypertension. Diabetes mellitus. Dyslipidemia.  ------------------------------------------------------------------- Study Conclusions  - Left ventricle: The cavity size was normal. Wall thickness was increased in a pattern of moderate LVH. Systolic function was normal. The estimated ejection fraction was in the range of  55% to 60%. Wall motion was normal; there were no regional wall motion abnormalities. - Mitral valve: Moderately calcified annulus. - Left atrium: The atrium was mildly dilated. - Right ventricle: The cavity size was mildly dilated. - Right atrium: The atrium was severely dilated. - Pulmonary arteries: Systolic pressure was moderately increased. PA peak pressure: 44 mm Hg (S). - Pericardium, extracardiac: There was a left pleural  effusion.  ------------------------------------------------------------------- Study data:  Comparison was made to the study of 07/13/2012.  Study status:  Routine.  Procedure:  Transthoracic echocardiography. Image quality was adequate.  Study completion:  There were no complications.          Transthoracic echocardiography.  M-mode, complete 2D, spectral Doppler, and color Doppler.  Birthdate: Patient birthdate: [birth year].  Age:  Patient is 89 yr old.  Sex: Gender: male.    BMI: 23.6 kg/m^2.  Blood pressure:     116/49 Patient status:  Inpatient.  Study date:  Study date: 04/17/2016. Study time: 09:26 AM.  Location:  Echo laboratory.  -------------------------------------------------------------------  ------------------------------------------------------------------- Left ventricle:  The cavity size was normal. Wall thickness was increased in a pattern of moderate LVH. Systolic function was normal. The estimated ejection fraction was in the range of 55% to 60%. Wall motion was normal; there were no regional wall motion abnormalities. The study was not technically sufficient to allow evaluation of LV diastolic dysfunction due to atrial fibrillation.  ------------------------------------------------------------------- Aortic valve:   Mildly thickened leaflets.  Doppler:   There was no stenosis.   There was no regurgitation.  ------------------------------------------------------------------- Aorta:  Aortic root: The aortic root was normal in size. Ascending aorta: The ascending aorta was normal in size.  ------------------------------------------------------------------- Mitral valve:   Moderately calcified annulus.  Doppler:  There was no significant regurgitation.    Peak gradient (D): 5 mm Hg.  ------------------------------------------------------------------- Left atrium:  The atrium was mildly  dilated.  ------------------------------------------------------------------- Right ventricle:  The cavity size was mildly dilated. Systolic function was normal.  ------------------------------------------------------------------- Pulmonic valve:    The valve appears to be grossly normal. Doppler:  There was mild to moderate regurgitation.  ------------------------------------------------------------------- Tricuspid valve:   The valve appears to be grossly normal. Doppler:  There was trivial regurgitation.  ------------------------------------------------------------------- Pulmonary artery:   Systolic pressure was moderately increased.  ------------------------------------------------------------------- Right atrium:  The atrium was severely dilated.  ------------------------------------------------------------------- Pericardium:  There was no pericardial effusion.  ------------------------------------------------------------------- Pleura:  There was a left pleural effusion.  ------------------------------------------------------------------- Measurements  Left ventricle                         Value        Reference LV ID, ED, PLAX chordal        (L)     34    mm     43 - 52 LV ID, ES, PLAX chordal        (L)     20.9  mm     23 - 38 LV fx shortening, PLAX chordal         39    %      >=29 LV PW thickness, ED                    16.4  mm     --------- IVS/LV PW ratio, ED                    0.78         <=  1.3  Ventricular septum                     Value        Reference IVS thickness, ED                      12.8  mm     ---------  LVOT                                   Value        Reference LVOT ID, S                             20    mm     --------- LVOT area                              3.14  cm^2   ---------  Aorta                                  Value        Reference Aortic root ID, ED                     28    mm     ---------  Left atrium                             Value        Reference LA ID, A-P, ES                         39    mm     --------- LA ID/bsa, A-P                         2.03  cm/m^2 <=2.2 LA volume, S                           92.8  ml     --------- LA volume/bsa, S                       48.2  ml/m^2 --------- LA volume, ES, 1-p A4C                 83    ml     --------- LA volume/bsa, ES, 1-p A4C             43.1  ml/m^2 --------- LA volume, ES, 1-p A2C                 104   ml     --------- LA volume/bsa, ES, 1-p A2C             54    ml/m^2 ---------  Mitral valve                           Value        Reference Mitral deceleration time       (H)     271  ms     150 - 230 Mitral peak gradient, D                5     mm Hg  ---------  Pulmonary arteries                     Value        Reference PA pressure, S, DP             (H)     44    mm Hg  <=30  Tricuspid valve                        Value        Reference Tricuspid regurg peak velocity         302   cm/s   --------- Tricuspid peak RV-RA gradient          36    mm Hg  ---------  Systemic veins                         Value        Reference Estimated CVP                          8     mm Hg  ---------  Right ventricle                        Value        Reference RV pressure, S, DP             (H)     44    mm Hg  <=30  Legend: (L)  and  (H)  mark values outside specified reference range.  ------------------------------------------------------------------- Prepared and Electronically Authenticated by  Mertie Moores, M.D. 2017-10-19T11:26:39              EKG:  EKG is not ordered today.   Recent Labs: 07/29/2022: B Natriuretic Peptide 395.2; BUN 29; Creatinine, Ser 1.01; Hemoglobin 12.7; Platelets 120; Potassium 4.7; Sodium 138  Recent Lipid Panel No results found for: "CHOL", "TRIG", "HDL", "CHOLHDL", "VLDL", "LDLCALC", "LDLDIRECT"   Home Medications   Current Meds  Medication Sig   Acetaminophen (TYLENOL 8 HOUR PO) Take 2 tablets by mouth as  needed.   amLODipine (NORVASC) 2.5 MG tablet Take 1 tablet (2.5 mg total) by mouth daily.   atorvastatin (LIPITOR) 40 MG tablet Take 1 tablet (40 mg total) by mouth daily.   calcium carbonate (TUMS - DOSED IN MG ELEMENTAL CALCIUM) 500 MG chewable tablet Chew 1 tablet by mouth daily as needed for indigestion or heartburn.   Calcium Carbonate-Vitamin D (CALCIUM PLUS VITAMIN D PO) Take 1 tablet by mouth daily.    carvedilol (COREG) 3.125 MG tablet Take 1 tablet (3.125 mg total) by mouth 2 (two) times daily with a meal.   diphenhydrAMINE (BENADRYL) 25 MG tablet Take 25 mg by mouth at bedtime. Takes as needed   Docusate Calcium (STOOL SOFTENER PO) Take 1-3 Doses by mouth daily.   donepezil (ARICEPT) 10 MG tablet Take 1 tablet (10 mg total) by mouth at bedtime. Please call us to increase this dose after a 3 month period.   furosemide (LASIX) 20 MG tablet Take 1 tablet (20 mg total) by mouth daily.   memantine (NAMENDA) 10 MG tablet Take 1 tablet (  10 mg total) by mouth 2 (two) times daily.   metFORMIN (GLUCOPHAGE) 500 MG tablet Take 1 tablet (500 mg total) by mouth 2 (two) times daily with a meal.   Multiple Vitamin (MULTIVITAMIN) tablet Take 1 tablet by mouth daily.   nitroGLYCERIN (NITROSTAT) 0.4 MG SL tablet DISSOLVE ONE TABLET UNDER THE TONGUE EVERY 5 MINUTES AS NEEDED FOR CHEST PAIN.  DO NOT EXCEED A TOTAL OF 3 DOSES IN 15 MINUTES   Omega-3 Fatty Acids (OMEGA 3 500 PO) Take 1 capsule by mouth daily.   oxybutynin (DITROPAN-XL) 5 MG 24 hr tablet Take 5 mg by mouth 2 (two) times daily.   potassium chloride (MICRO-K) 10 MEQ CR capsule Take 1 capsule (10 mEq total) by mouth daily.   Rivaroxaban (XARELTO) 15 MG TABS tablet Take 1 tablet (15 mg total) by mouth daily with supper.   traMADol (ULTRAM) 50 MG tablet Take 1 tablet (50 mg total) by mouth every 8 (eight) hours as needed.   valsartan (DIOVAN) 160 MG tablet Take 1 tablet (160 mg total) by mouth daily.   [DISCONTINUED] furosemide (LASIX) 40 MG  tablet Take 1/2 (one-half) tablet by mouth once daily   [DISCONTINUED] losartan (COZAAR) 100 MG tablet Take 1 tablet (100 mg total) by mouth daily.   [DISCONTINUED] potassium chloride SA (KLOR-CON M) 20 MEQ tablet TAKE 1  BY MOUTH ONCE DAILY   [DISCONTINUED] Rivaroxaban (XARELTO) 15 MG TABS tablet Take 1 tablet (15 mg total) by mouth 2 (two) times daily with a meal.   [DISCONTINUED] valACYclovir (VALTREX) 1000 MG tablet Take 1 tablet (1,000 mg total) by mouth 3 (three) times daily.     Review of Systems      All other systems reviewed and are otherwise negative except as noted above.  Physical Exam    VS:  BP 135/70 (BP Location: Left Arm, Patient Position: Sitting, Cuff Size: Normal)   Pulse (!) 51   Ht 5\' 8"  (1.727 m)   Wt 147 lb 8 oz (66.9 kg)   SpO2 98%   BMI 22.43 kg/m  , BMI Body mass index is 22.43 kg/m.  Wt Readings from Last 3 Encounters:  09/30/22 147 lb 8 oz (66.9 kg)  12/10/21 143 lb (64.9 kg)  11/13/21 145 lb (65.8 kg)     GEN: Well nourished, well developed, in no acute distress. HEENT: normal. Neck: Supple, no JVD, carotid bruits, or masses. Cardiac: IRIR, no murmurs, rubs, or gallops. No clubbing, cyanosis, edema.  Radials/PT 2+ and equal bilaterally.  Respiratory:  Respirations regular and unlabored, clear to auscultation bilaterally. GI: Soft, nontender, nondistended. MS: No deformity or atrophy. Skin: Warm and dry, no rash. Neuro:  Strength and sensation are intact. Psych: Normal affect.  Assessment & Plan    HTN - BP not at goal <130/80. Careful titration of antihypertensives to avoid hypotension due to risk for falls.  Encouraged to monitor BP at home at least 1 hour after medications and sitting and resting 5 to 10 minutes.  Will stop losartan 100 mg and start valsartan 160 mg daily.  Continue amlodipine 2.5 mg daily.  Check in via MyChart in 1 week.  If BP not at goal could consider further increasing valsartan or amlodipine.  Given age, frailty blood  pressure goal less than 140/90 is reasonable.  Permanent atrial fibrillation/hypercoagulable state- rate controlled on low-dose carvedilol.  Asymptomatic in regards to bradycardia and atrial fibrillation.  Continue Xarelto.  Denies bleeding applications.  CAD / HLD- three-vessel coronaries by cardiac cath  2009. Stable with no anginal symptoms. No indication for ischemic evaluation.  Continue Lipitor.   Chronic diastolic heart failure /Hypokalemia - Euvolemic and well compensated on exam. Continue Lasix 20mg  QD (refill provided). Continue potassium - will Rx MicroK 46mEq daily to assist with pill size.         Disposition: Follow up  in 3-4 months  with Peter Martinique, MD or APP.  Signed, Loel Dubonnet, NP 09/30/2022, 4:59 PM [place]

## 2022-10-07 ENCOUNTER — Encounter (HOSPITAL_BASED_OUTPATIENT_CLINIC_OR_DEPARTMENT_OTHER): Payer: Self-pay

## 2022-10-09 MED ORDER — AMLODIPINE BESYLATE 5 MG PO TABS: 5.0000 mg | ORAL_TABLET | Freq: Every day | ORAL | 3 refills | Status: DC

## 2022-10-15 ENCOUNTER — Other Ambulatory Visit: Payer: Self-pay | Admitting: Cardiology

## 2022-10-24 MED ORDER — AMLODIPINE BESYLATE 5 MG PO TABS: 5.0000 mg | ORAL_TABLET | Freq: Every day | ORAL | 3 refills | Status: DC

## 2022-10-24 NOTE — Addendum Note (Signed)
Addended by: Marlene Lard on: 10/24/2022 02:43 PM   Modules accepted: Orders

## 2022-11-13 NOTE — Patient Instructions (Signed)
Below is our plan:  We will continue memantine 10mg  twice daily and donepezil 10mg  daily.   Please make sure you are staying well hydrated. I recommend 50-60 ounces daily. Well balanced diet and regular exercise encouraged. Consistent sleep schedule with 6-8 hours recommended.   Please continue follow up with care team as directed.   Follow up with me in 1 year   You may receive a survey regarding today's visit. I encourage you to leave honest feed back as I do use this information to improve patient care. Thank you for seeing me today!   Management of Memory Problems   There are some general things you can do to help manage your memory problems.  Your memory may not in fact recover, but by using techniques and strategies you will be able to manage your memory difficulties better.   1)  Establish a routine. Try to establish and then stick to a regular routine.  By doing this, you will get used to what to expect and you will reduce the need to rely on your memory.  Also, try to do things at the same time of day, such as taking your medication or checking your calendar first thing in the morning. Think about think that you can do as a part of a regular routine and make a list.  Then enter them into a daily planner to remind you.  This will help you establish a routine.   2)  Organize your environment. Organize your environment so that it is uncluttered.  Decrease visual stimulation.  Place everyday items such as keys or cell phone in the same place every day (ie.  Basket next to front door) Use post it notes with a brief message to yourself (ie. Turn off light, lock the door) Use labels to indicate where things go (ie. Which cupboards are for food, dishes, etc.) Keep a notepad and pen by the telephone to take messages   3)  Memory Aids A diary or journal/notebook/daily planner Making a list (shopping list, chore list, to do list that needs to be done) Using an alarm as a reminder (kitchen  timer or cell phone alarm) Using cell phone to store information (Notes, Calendar, Reminders) Calendar/White board placed in a prominent position Post-it notes   In order for memory aids to be useful, you need to have good habits.  It's no good remembering to make a note in your journal if you don't remember to look in it.  Try setting aside a certain time of day to look in journal.   4)  Improving mood and managing fatigue. There may be other factors that contribute to memory difficulties.  Factors, such as anxiety, depression and tiredness can affect memory. Regular gentle exercise can help improve your mood and give you more energy. Exercise: there are short videos created by the [employer] on Health specially for older adults: https://bit.ly/2I30q97.  Mediterranean diet: which emphasizes fruits, vegetables, whole grains, legumes, fish, and other seafood; unsaturated fats such as olive oils; and low amounts of red meat, eggs, and sweets. A variation of this, called MIND (Mediterranean-DASH Intervention for Neurodegenerative Delay) incorporates the DASH (Dietary Approaches to Stop Hypertension) diet, which has been shown to lower high blood pressure, a risk factor for Alzheimer's disease. More information at: ExitMarketing.de.  Aerobic exercise that improve heart health is also good for the mind.  [employer] on Aging have short videos for exercises that you can do at home: BlindWorkshop.com.pt Simple relaxation techniques  may help relieve symptoms of anxiety Try to get back to completing activities or hobbies you enjoyed doing in the past. Learn to pace yourself through activities to decrease fatigue. Find out about some local support groups where you can share experiences with others. Try and achieve 7-8 hours of sleep at night.   Resources for Family/Caregiver  Online caregiver support groups can  be found at WesternTunes.it or call Alzheimer's Association's 24/7 hotline: 681-461-2916. [facility] offers in-person, virtual support groups and individual counseling for both care partners and persons with memory loss. Call for more information at [phone].   Advanced care plan: there are two types of Power of Attorney: healthcare and durable. Healthcare POA is a designated person to make healthcare decisions on your behalf if you were too sick to make them yourself. This person can be selected and documented by your physician. Durable POA has to be set up with a lawyer who takes charge of your finances and estate if you were too sick or cognitively impaired to manage your finances accurately. You can find a local Elder Therapist, art here: [place]Ranch.at.  Check out www.planyourlifespan.org, which will help you plan before a crisis and decide who will take care of life considerations in a circumstance where you may not be able to speak for yourself.   Helpful books (available on [employer] or your local bookstore):  By Dr. Carl Best: Keeping Love Alive as Memories Fade: The 5 Love Languages and the Alzheimer's Journey Mar 31, 2015 The Dementia Care Partner's Workbook: A Guide for Understanding, Education, and [employer] - November 28, 2017.  Both available for less than $15.   "Coping with behavior change in dementia: a family caregiver's guide" by Ricardo Jericho & Valora Piccolo "A Caregiver's Guide to Dementia: Using Activities and Other Strategies to Prevent, Reduce and Manage Behavioral Symptoms" by Mahala Menghini Gitlin and [employer].  Youth worker of Joy for the Person with Alzheimer's or Dementia" 4th edition by Tama High  Caregiver videos on common behaviors related to dementia: PopulationGame.pl  [place] Caregiver Portal: free to sign up, links to local resources: [email or web address]

## 2022-11-13 NOTE — Progress Notes (Signed)
Chief Complaint  Patient presents with   Follow-up    Pt in room 1, wife in room. Here for Dementia follow up, uses walker. Wife said pt asks about routine things, ask same questions several times in a row. MMSE:14     HISTORY OF PRESENT ILLNESS:  11/17/22 ALL: Bruce Mann returns for follow up for dementia. He was last seen 10/2021 and doing well. He continues donepezil 10mg  QHS and memantine 10mg  BID. Memory is fairly stable. He may be a little more repetitive in questioning. More confusion at times with how to perform ADLs. He seems to be sleeping well. May be a little less sleepy during the day. He is eating normally. Does have a preference for sweets. Mood is good. No unusual behaviors. Gait is stable with walker. No falls.   11/13/2021 ALL: Bruce Mann returns for follow up for dementia. He continues donepezil and memantine. He feels symptoms are stable. [name] agrees. She reports he had good days and bad days. She reports that he woke up yesterday morning and was asking her questions of who she was and where she was from. He seemed to be better once fully awake and moving around. He is eating well. Sleeping well. He does need help with ADLs. She doses meds. He does not drive. Gait has been stable with walker. No additional falls.   05/15/2021 ALL:  Bruce Mann returns for follow up for dementia. He continues Aricept 10mg  daily. We increased Namenda to 10mg  BID at last visit 10/2020. He was seen in the ER 02/09/2021 following an unwitnessed fall in the bathroom at [facility]. He was without his walker that day. EMS was concerned of lower extremity weakness and code stroke initiated. CT was unremarkable. Neurology consulted and exam not consistent with acute stroke. Right middle finger dislocation was reduced and patient was discharged home. Since, he is doing well. Memory is about the same. He is tolerating meds. No significant changes and no additional falls.   11/12/2020 ALL:  Bruce Mann returns for dementia  follow up. We continues Aricept 10mg  daily and added Namenda 5mg  BID at last visit 07/2020. He has tolerated it well. [name] isn't sure if it has helped much. Memory seems to wax and wane. He continues to be independent with ADLs. He does not drive. He is eating normally. He is sleeping more but seems to rest better.  He is not very active. He did start PT on his own accord for strength training. He is going 2-3 times a week. He does feel this has been helpful. He denies falls. He is using a walker. He is meeting with his friends for coffee regularly.   08/01/2020 ALL:  Bruce Mann is a 89 y.o. male here today for follow up for dementia. He was started on Aricept 5mg  in 01/2020 and increased dose to 10mg  in 04/2020. He has tolerated medication fairly well. Uncertain if they have noticed any improvements. He continues to have difficulty with short term memory is very repetitive in questioning. He is doing well, otherwise. He is considering going back to the gym to participate in a senior exercise program. He is not driving at this time due to some pain concerns. He continues to perform ADL's independently. He is walking without difficulty.    HISTORY (copied from Dr Dohmeier's previous note)  Bruce Mann is a 89 year old Caucasian male patient seen here as a referral on 01/30/2020 .  Chief concern according to patient : memory loss -  I have the pleasure of seeing Bruce Mann today, a right-handed White or Caucasian male with a possible dementia  disorder.  he has a past medical history of Anemia, Atrial fibrillation (HCC) (2014), CAD (coronary artery disease), CHF (congestive heart failure) (HCC) (03/2016), Diabetes mellitus type II (2005), Dyslipidemia,Hiatal hernia, Hyperlipidemia, Hypertension, Shingles (05/2017) on the right upper back.   Family medical history: No other family member with memory loss. Brother died in 11/15/2022 and had few memory issues at age 53.    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 He lives in a household with 2 persons. Family status is married with 4 children, 5 grandchildren.  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  Pets are not present. Tobacco use-  none.   ETOH use : seldomly,  Caffeine intake in form of Coffee( 1 cup decaffeinated ) Soda( social ) Tea ( /) or energy drinks.   Patient has noted difficulties to answer questions, is unsure about number of grandchildren. Twice a week in the gym. He does drive in town, not highway.  He has had days when he felt unable to imaging to plan the route to a location. He is a native [place] resident.     REVIEW OF SYSTEMS: Out of a complete 14 system review of symptoms, the patient complains only of the following symptoms, memory loss, bursitis and all other reviewed systems are negative.   ALLERGIES: No Known Allergies   HOME MEDICATIONS: Outpatient Medications Prior to Visit  Medication Sig Dispense Refill   Acetaminophen (TYLENOL 8 HOUR PO) Take 2 tablets by mouth as needed.     amLODipine (NORVASC) 5 MG tablet Take 1 tablet (5 mg total) by mouth daily. 90 tablet 3   atorvastatin (LIPITOR) 40 MG tablet Take 1 tablet by mouth once daily 90 tablet 0   calcium carbonate (TUMS - DOSED IN MG ELEMENTAL CALCIUM) 500 MG chewable tablet Chew 1 tablet by mouth daily as needed for indigestion or heartburn.     Calcium Carbonate-Vitamin D (CALCIUM PLUS VITAMIN D PO) Take 1 tablet by mouth daily.      carvedilol (COREG) 3.125 MG tablet Take 1 tablet (3.125 mg total) by mouth 2 (two) times daily with a meal. 180 tablet 3   diphenhydrAMINE (BENADRYL) 25 MG tablet Take 25 mg by mouth at bedtime. Takes as needed     Docusate Calcium (STOOL SOFTENER PO) Take 1-3 Doses by mouth daily.     furosemide (LASIX) 20 MG tablet Take 1 tablet (20 mg total) by mouth daily. 90 tablet 3   metFORMIN (GLUCOPHAGE) 500 MG tablet Take 1 tablet (500  mg total) by mouth 2 (two) times daily with a meal.     Multiple Vitamin (MULTIVITAMIN) tablet Take 1 tablet by mouth daily.     nitroGLYCERIN (NITROSTAT) 0.4 MG SL tablet DISSOLVE ONE TABLET UNDER THE TONGUE EVERY 5 MINUTES AS NEEDED FOR CHEST PAIN.  DO NOT EXCEED A TOTAL OF 3 DOSES IN 15 MINUTES 25 tablet 3   Omega-3 Fatty Acids (OMEGA 3 500 PO) Take 1 capsule by mouth daily.     oxybutynin (DITROPAN-XL) 5 MG 24 hr tablet Take 5 mg by mouth 2 (two) times daily.     potassium chloride (MICRO-K) 10 MEQ CR capsule Take 1 capsule (10 mEq total) by mouth daily. 90 capsule 3   Rivaroxaban (XARELTO) 15 MG TABS tablet Take 1 tablet (  15 mg total) by mouth daily with supper. 90 tablet 3   traMADol (ULTRAM) 50 MG tablet Take 1 tablet (50 mg total) by mouth every 8 (eight) hours as needed. 15 tablet 0   valsartan (DIOVAN) 160 MG tablet Take 1 tablet (160 mg total) by mouth daily. 90 tablet 3   donepezil (ARICEPT) 10 MG tablet Take 1 tablet (10 mg total) by mouth at bedtime. Please call us to increase this dose after a 3 month period. 90 tablet 3   memantine (NAMENDA) 10 MG tablet Take 1 tablet (10 mg total) by mouth 2 (two) times daily. 180 tablet 3   No facility-administered medications prior to visit.     PAST MEDICAL HISTORY: Past Medical History:  Diagnosis Date   Anemia    Atrial fibrillation (HCC) 2014   CAD (coronary artery disease)    CHF (congestive heart failure) (HCC) 03/2016   NEW ONSET   Dementia (HCC)    Diabetes mellitus type II 2005   Dyslipidemia    Erectile dysfunction    Gait instability    Hiatal hernia    Hyperlipidemia    Hypertension    Shingles 05/2017   Weakness 03/2016     PAST SURGICAL HISTORY: Past Surgical History:  Procedure Laterality Date   CARDIAC CATHETERIZATION     Ejection Fraction 60%   CATARACT EXTRACTION, BILATERAL     COLONOSCOPY     TONSILLECTOMY       FAMILY HISTORY: Family History  Problem Relation Age of Onset   Stroke Mother     Heart attack Father    Heart attack Brother    Neuropathy Brother    Colon cancer Neg Hx      SOCIAL HISTORY: Social History   Socioeconomic History   Marital status: Married    Spouse name: Bruce Mann   Number of children: 4   Years of education: Not on file   Highest education level: Not on file  Occupational History   Occupation: real Engineer, drilling  Tobacco Use   Smoking status: Never   Smokeless tobacco: Never  Vaping Use   Vaping Use: Never used  Substance and Sexual Activity   Alcohol use: Yes    Comment: Occasional glass of wine   Drug use: No   Sexual activity: Not on file  Other Topics Concern   Not on file  Social History Narrative   Not on file   Social Determinants of Health   Financial Resource Strain: Not on file  Food Insecurity: Not on file  Transportation Needs: Not on file  Physical Activity: Not on file  Stress: Not on file  Social Connections: Not on file  Intimate Partner Violence: Not on file      PHYSICAL EXAM  Vitals:   11/17/22 1423  BP: (!) 136/55  Pulse: (!) 53  Weight: 149 lb (67.6 kg)  Height: 5\' 5"  (1.651 m)     Body mass index is 24.79 kg/m.   Generalized: Well developed, in no acute distress  Cardiology: normal rate and rhythm, no murmur auscultated  Respiratory: clear to auscultation bilaterally    Neurological examination  Mentation: Alert, not oriented to time, but he is to place and history taking. Follows all commands speech and language fluent Cranial nerve II-XII: Pupils were equal round reactive to light. Extraocular movements were full, visual field were full on confrontational test. Facial sensation and strength were normal. Head turning and shoulder shrug  were normal and symmetric. Motor: The motor testing  reveals 5 over 5 strength of all 4 extremities. Good symmetric motor tone is noted throughout.  Sensory: Sensory testing is intact to soft touch on all 4 extremities. No evidence of extinction is noted.   Coordination: Cerebellar testing reveals good finger-nose-finger and heel-to-shin bilaterally.  Gait and station: Pushes to standing position. Gait is stable with walker. He does have a slightly stooped posture    DIAGNOSTIC DATA (LABS, IMAGING, TESTING) - I reviewed patient records, labs, notes, testing and imaging myself where available.  Lab Results  Component Value Date   WBC 8.1 07/29/2022   HGB 12.7 (L) 07/29/2022   HCT 40.3 07/29/2022   MCV 102.0 (H) 07/29/2022   PLT 120 (L) 07/29/2022      Component Value Date/Time   NA 138 07/29/2022 0415   K 4.7 07/29/2022 0415   CL 107 07/29/2022 0415   CO2 22 07/29/2022 0415   GLUCOSE 111 (H) 07/29/2022 0415   BUN 29 (H) 07/29/2022 0415   CREATININE 1.01 07/29/2022 0415   CREATININE 1.43 (H) 04/25/2016 1143   CALCIUM 9.5 07/29/2022 0415   PROT 6.5 02/09/2021 1350   ALBUMIN 3.5 02/09/2021 1350   AST 50 (H) 02/09/2021 1350   ALT 30 02/09/2021 1350   ALKPHOS 42 02/09/2021 1350   BILITOT 0.8 02/09/2021 1350   GFRNONAA >60 07/29/2022 0415   GFRNONAA 45 (L) 04/25/2016 1143   GFRAA 52 (L) 04/25/2016 1143   No results found for: "CHOL", "HDL", "LDLCALC", "LDLDIRECT", "TRIG", "CHOLHDL" No results found for: "HGBA1C" Lab Results  Component Value Date   VITAMINB12 583 04/17/2016   Lab Results  Component Value Date   TSH 5.557 (H) 04/16/2016       11/17/2022    2:29 PM 11/13/2021    2:42 PM 05/15/2021    2:36 PM  MMSE - Mini Mental State Exam  Orientation to time 0 1 0  Orientation to Place 3 3 4   Registration 3 3 3   Attention/ Calculation 0 1 1  Recall 0 0 0  Language- name 2 objects 2 2 2   Language- repeat 1 1 1   Language- follow 3 step command 3 1 3   Language- read & follow direction 1 1 1   Write a sentence 1 1 0  Copy design 0 0 0  Total score 14 14 15          No data to display           ASSESSMENT AND PLAN  89 y.o. year old male  has a past medical history of Anemia, Atrial fibrillation (HCC) (2014),  CAD (coronary artery disease), CHF (congestive heart failure) (HCC) (03/2016), Dementia (HCC), Diabetes mellitus type II (2005), Dyslipidemia, Erectile dysfunction, Gait instability, Hiatal hernia, Hyperlipidemia, Hypertension, Shingles (05/2017), and Weakness (03/2016). here with   Dementia without behavioral disturbance [facility])  Mr Portnoy is doing fairly well, today.  He has tolerated Aricept 10 mg daily and Namenda 10mg  BID. We will continue current treatment plan. MMSE stable, 14/30. Fall precautions reviewed. He will continue healthy lifestyle habits. Memory compensation strategies reviewed. I have encouraged him to continue regular physical and mental activity.  He will continue close follow-up with primary care.  I will have him follow-up with me in 1 year.  He verbalizes understanding and agreement with this plan.  No orders of the defined types were placed in this encounter.    Meds ordered this encounter  Medications   donepezil (ARICEPT) 10 MG tablet    Sig: Take 1 tablet (10  mg total) by mouth at bedtime.    Dispense:  90 tablet    Refill:  3    Order Specific Question:   Supervising Provider    Answer:   Anson Fret [[id number]]   memantine (NAMENDA) 10 MG tablet    Sig: Take 1 tablet (10 mg total) by mouth 2 (two) times daily.    Dispense:  180 tablet    Refill:  3    Order Specific Question:   Supervising Provider    Answer:   Anson Fret [id number]     I spent 30 minutes of face-to-face and non-face-to-face time with patient.  This included previsit chart review, lab review, study review, order entry, electronic health record documentation, patient education.   Shawnie Dapper, MSN, FNP-C 11/17/2022, 3:05 PM  [facility] [address], [address] [place], [place] [address] [phone]

## 2022-11-17 ENCOUNTER — Encounter: Payer: Self-pay | Admitting: Family Medicine

## 2022-11-17 ENCOUNTER — Ambulatory Visit: Payer: Medicare Other | Admitting: Family Medicine

## 2022-11-17 VITALS — BP 136/55 | HR 53 | Ht 65.0 in | Wt 149.0 lb

## 2022-11-17 DIAGNOSIS — F0390 Unspecified dementia without behavioral disturbance: Secondary | ICD-10-CM

## 2022-11-17 MED ORDER — MEMANTINE HCL 10 MG PO TABS: 10.0000 mg | ORAL_TABLET | Freq: Two times a day (BID) | ORAL | 3 refills | Status: DC

## 2022-11-17 MED ORDER — DONEPEZIL HCL 10 MG PO TABS: 10.0000 mg | ORAL_TABLET | Freq: Every day | ORAL | 3 refills | Status: DC

## 2022-12-15 ENCOUNTER — Telehealth (HOSPITAL_BASED_OUTPATIENT_CLINIC_OR_DEPARTMENT_OTHER): Payer: Self-pay | Admitting: *Deleted

## 2022-12-15 NOTE — Telephone Encounter (Signed)
Reviewed  mychart message Denies any shortness of breath or weight gain  Discussed with Ronn Melena NP and agreed with plan  Advised to also reach out to PCP

## 2022-12-15 NOTE — Telephone Encounter (Signed)
Appointment Request From: Bernadene Bell   With Provider: Alver Sorrow, NP [facility] [address]]   Preferred Date Range: 12/15/2022 - 12/17/2022   Preferred Times: Any Time   Reason for visit: Office Visit   Comments: Bruce Mann is walking VERY slowly (shuffling) reminiscent of how he was walking before he had CHF in 2017, but no swelling observed. He is sleeping almost all of the time except when eating or going to bathroom. I would like him checked out asap. Recent BP checks: 129/67, 124/62, 126/65. Thank you.  Above message received under appointment request Scheduled next available with Loleta Books PA 7/16  Mychart message asking if any shortness of breath or weight gain

## 2023-01-07 ENCOUNTER — Other Ambulatory Visit: Payer: Self-pay | Admitting: Cardiology

## 2023-01-10 NOTE — Progress Notes (Signed)
Cardiology Office Note:  .   Date:  01/13/2023  ID:  Bernadene Bell, DOB [birth year], MRN [id number] PCP: Gaspar Garbe, MD  [facility] Providers Cardiologist:  Peter Swaziland, MD  History of Present Illness: .   ARNIE CLINGENPEEL is a 89 y.o. male with a past medical history of CAD s/p angioplasty of OM1 in 2001, HTN, diastolic heart failure, permanent atrial fibrillation. Patient is followed by Dr. Swaziland and presents today for evaluation of BP.   Per chart review, patient previously had angioplasty of OM1 in 2001. Later, patient had an abnormal nuclear stress test in 2009. Underwent cardiac catheterization that showed three-vessel CAD with normal EF. He was managed medically. Later in 03/2016, patient was admitted with acute diastolic heart failure. Echocardiogram on 04/17/16 showed EF 55-60%, no regional wall motion abnormalities, moderate LVH. His heart rate has been well-controlled on low-dose carvedilol. He is compliant with xarelto.   Patient was last seen by cardiology on 09/30/22 for evaluation of HTN. Patient denied shortness of breath, DOE, chest pain, edema, orthopnea. He was transitioned from losartan to valsartan 160 mg daily. Remained on amlodipine 5 mg daily and carvedilol 3.125 mg BID.   Today, patient presents accompanied by his wife. She reports that when she initially called in June to schedule the appointment, she was concerned that he was shuffling his feet/moving very slowly, and had been sleeping all day. Now, he is back to walking normally and is sleeping less. She is no longer concerned about these symptoms, but she wants to make sure his BP is well controlled. She keeps an excellent record of his BP, and his BP has been running between the 120s-140s systolic. Today, BP is 108/54, which is lower than usual. He denies dizziness, lightheadedness, fatigue.  Patient denies chest pain, palpitations, dizziness, syncope, near syncope, shortness of breath. Occasionally gets  some nasal congestion that goes away with nasal sprays. Denies any dizziness or lightheadedness upon standing. He has not fallen in over 2 years. No bleeding issues on Xarelto   ROS: Denies chest pain, palpitations, shortness of breath, ankle edema, dizziness, syncope, near syncope. No lightheadedness upon standing   Studies Reviewed: .   Cardiac Studies & Procedures       ECHOCARDIOGRAM  ECHOCARDIOGRAM COMPLETE 04/17/2016  Narrative *[facility]* *[facility]* [address] [place], [place] [address] [phone]  ------------------------------------------------------------------- Transthoracic Echocardiography  Patient:    Kempton, Milne MR #:       [id number] Study Date: 04/17/2016 Gender:     M Age:        55 Height:     177.8 cm Weight:     74.8 kg BSA:        1.93 m^2 Pt. Status: Room:       3E05C  SONOGRAPHER  Laruth Bouchard PERFORMING   [facility], Inpatient ATTENDING    Lynelle Doctor, Md  cc:  ------------------------------------------------------------------- LV EF: 55% -   60%  ------------------------------------------------------------------- Indications:      CHF - 428.0.  ------------------------------------------------------------------- History:   PMH:   Atrial fibrillation.  Coronary artery disease. Risk factors:  Hypertension. Diabetes mellitus. Dyslipidemia.  ------------------------------------------------------------------- Study Conclusions  - Left ventricle: The cavity size was normal. Wall thickness was increased in a pattern of moderate LVH. Systolic function was normal. The estimated ejection fraction was in the range of 55% to 60%. Wall motion was normal; there were no regional wall motion abnormalities. - Mitral  valve: Moderately calcified annulus. - Left atrium: The atrium was mildly dilated. - Right ventricle: The cavity size was mildly dilated. - Right atrium: The atrium was severely  dilated. - Pulmonary arteries: Systolic pressure was moderately increased. PA peak pressure: 44 mm Hg (S). - Pericardium, extracardiac: There was a left pleural effusion.  ------------------------------------------------------------------- Study data:  Comparison was made to the study of 07/13/2012.  Study status:  Routine.  Procedure:  Transthoracic echocardiography. Image quality was adequate.  Study completion:  There were no complications.          Transthoracic echocardiography.  M-mode, complete 2D, spectral Doppler, and color Doppler.  Birthdate: Patient birthdate: 1930/08/24.  Age:  Patient is 89 yr old.  Sex: Gender: male.    BMI: 23.6 kg/m^2.  Blood pressure:     116/49 Patient status:  Inpatient.  Study date:  Study date: 04/17/2016. Study time: 09:26 AM.  Location:  Echo laboratory.  -------------------------------------------------------------------  ------------------------------------------------------------------- Left ventricle:  The cavity size was normal. Wall thickness was increased in a pattern of moderate LVH. Systolic function was normal. The estimated ejection fraction was in the range of 55% to 60%. Wall motion was normal; there were no regional wall motion abnormalities. The study was not technically sufficient to allow evaluation of LV diastolic dysfunction due to atrial fibrillation.  ------------------------------------------------------------------- Aortic valve:   Mildly thickened leaflets.  Doppler:   There was no stenosis.   There was no regurgitation.  ------------------------------------------------------------------- Aorta:  Aortic root: The aortic root was normal in size. Ascending aorta: The ascending aorta was normal in size.  ------------------------------------------------------------------- Mitral valve:   Moderately calcified annulus.  Doppler:  There was no significant regurgitation.    Peak gradient (D): 5 mm  Hg.  ------------------------------------------------------------------- Left atrium:  The atrium was mildly dilated.  ------------------------------------------------------------------- Right ventricle:  The cavity size was mildly dilated. Systolic function was normal.  ------------------------------------------------------------------- Pulmonic valve:    The valve appears to be grossly normal. Doppler:  There was mild to moderate regurgitation.  ------------------------------------------------------------------- Tricuspid valve:   The valve appears to be grossly normal. Doppler:  There was trivial regurgitation.  ------------------------------------------------------------------- Pulmonary artery:   Systolic pressure was moderately increased.  ------------------------------------------------------------------- Right atrium:  The atrium was severely dilated.  ------------------------------------------------------------------- Pericardium:  There was no pericardial effusion.  ------------------------------------------------------------------- Pleura:  There was a left pleural effusion.  ------------------------------------------------------------------- Measurements  Left ventricle                         Value        Reference LV ID, ED, PLAX chordal        (L)     34    mm     43 - 52 LV ID, ES, PLAX chordal        (L)     20.9  mm     23 - 38 LV fx shortening, PLAX chordal         39    %      >=29 LV PW thickness, ED                    16.4  mm     --------- IVS/LV PW ratio, ED                    0.78         <=1.3  Ventricular septum  Value        Reference IVS thickness, ED                      12.8  mm     ---------  LVOT                                   Value        Reference LVOT ID, S                             20    mm     --------- LVOT area                              3.14  cm^2   ---------  Aorta                                  Value         Reference Aortic root ID, ED                     28    mm     ---------  Left atrium                            Value        Reference LA ID, A-P, ES                         39    mm     --------- LA ID/bsa, A-P                         2.03  cm/m^2 <=2.2 LA volume, S                           92.8  ml     --------- LA volume/bsa, S                       48.2  ml/m^2 --------- LA volume, ES, 1-p A4C                 83    ml     --------- LA volume/bsa, ES, 1-p A4C             43.1  ml/m^2 --------- LA volume, ES, 1-p A2C                 104   ml     --------- LA volume/bsa, ES, 1-p A2C             54    ml/m^2 ---------  Mitral valve                           Value        Reference Mitral deceleration time       (H)     271   ms     150 - 230 Mitral peak gradient, D  5     mm Hg  ---------  Pulmonary arteries                     Value        Reference PA pressure, S, DP             (H)     44    mm Hg  <=30  Tricuspid valve                        Value        Reference Tricuspid regurg peak velocity         302   cm/s   --------- Tricuspid peak RV-RA gradient          36    mm Hg  ---------  Systemic veins                         Value        Reference Estimated CVP                          8     mm Hg  ---------  Right ventricle                        Value        Reference RV pressure, S, DP             (H)     44    mm Hg  <=30  Legend: (L)  and  (H)  mark values outside specified reference range.  ------------------------------------------------------------------- Prepared and Electronically Authenticated by  Kristeen Miss, M.D. 2017-10-19T11:26:39             Risk Assessment/Calculations:    CHA2DS2-VASc Score = 5  This indicates a 7.2% annual risk of stroke. The patient's score is based upon: CHF History: 1 HTN History: 1 Diabetes History: 0 Stroke History: 0 Vascular Disease History: 1 Age Score: 2 Gender Score: 0        Physical Exam:    VS:  BP (!) 108/54 (BP Location: Left Arm, Patient Position: Sitting, Cuff Size: Normal)   Pulse 65   Ht 5\' 5"  (1.651 m)   Wt 144 lb 12.8 oz (65.7 kg)   SpO2 97%   BMI 24.10 kg/m    Wt Readings from Last 3 Encounters:  01/13/23 144 lb 12.8 oz (65.7 kg)  11/17/22 149 lb (67.6 kg)  09/30/22 147 lb 8 oz (66.9 kg)    GEN: Frail, elderly male. Sitting comfortably in the chair in no acute distress  NECK: No JVD CARDIAC: Irregular rate and rhythm, no murmurs, rubs, gallops. Radial pulses 2+ bilaterally  RESPIRATORY:  rhonchi in Bilateral lung bases. No crackles or wheezing. Normal work of breathing on room air  ABDOMEN: Soft, non-tender, non-distended EXTREMITIES:  No edema in BLE; No deformity   ASSESSMENT AND PLAN: .    HTN  - Current BP regiment includes amlodipine 5 mg daily, carvedilol 3.125 mg BID, valsartan 160 mg daily - Review of BP log shows that his SBP has been predominantly in the 120s-140s  - Given advanced age, frailty, BP goal is less than 140/90  - BP today 108/58- patient denies dizziness, fatigue, lightheadedness, dizziness upon standing  - Encouraged patient/wife to maintain BP log and to call the office if SBP  is sustaining less than 105. Also instructed them to call us if he starts to have dizziness, lightheadedness upon standing, fatigue  - Check BMP today   Chronic Diastolic Heart Failure  - Most recent echocardiogram from 2017 showed EF 55-60%, no regional wall motion abnormalities, moderate LVH  - Patient has been on lasix 20 mg daily - He is euvolemic on exam- denies recent DOE, ankle edema, shortness of breath  - Continue lasix 20 mg daily and potassium chloride 10 meq daily  - Discussed importance of daily weights  - Check BMP   Permanent atrial fibrillation  - HR is well controlled on low-dose carvedilol. 65 BPM in office today. Denies palpitations, episodes of fast/racing heart  - Continue xarelto 15 mg daily-- denies bleeding issues   CAD  -  Cardiac catheterization from 2009 showed three-vessel disease  - Patient denies anginal symptoms  - Continue carvedilol 3.125 mg BID and lipitor 40 mg daily  - Patient not on ASA due to eliquis use   Dispo: 3-4 month follow up  Signed, Jonita Albee, PA-C

## 2023-01-13 ENCOUNTER — Ambulatory Visit: Payer: Medicare Other | Attending: Cardiology | Admitting: Cardiology

## 2023-01-13 ENCOUNTER — Encounter: Payer: Self-pay | Admitting: Cardiology

## 2023-01-13 VITALS — BP 108/54 | HR 65 | Ht 65.0 in | Wt 144.8 lb

## 2023-01-13 DIAGNOSIS — I4821 Permanent atrial fibrillation: Secondary | ICD-10-CM

## 2023-01-13 DIAGNOSIS — Z79899 Other long term (current) drug therapy: Secondary | ICD-10-CM | POA: Diagnosis not present

## 2023-01-13 DIAGNOSIS — I5032 Chronic diastolic (congestive) heart failure: Secondary | ICD-10-CM

## 2023-01-13 DIAGNOSIS — I10 Essential (primary) hypertension: Secondary | ICD-10-CM | POA: Diagnosis not present

## 2023-01-13 DIAGNOSIS — I2510 Atherosclerotic heart disease of native coronary artery without angina pectoris: Secondary | ICD-10-CM

## 2023-01-13 NOTE — Patient Instructions (Signed)
Medication Instructions:  The current medical regimen is effective;  continue present plan and medications as directed. Please refer to the Current Medication list given to you today.  *If you need a refill on your cardiac medications before your next appointment, please call your pharmacy*  Lab Work: BMET TODAY If you have labs (blood work) drawn today and your tests are completely normal, you will receive your results only by:  MyChart Message (if you have MyChart) OR  A paper copy in the mail If you have any lab test that is abnormal or we need to change your treatment, we will call you to review the results.  Testing/Procedures: NONE  Follow-Up: At [facility], you and your health needs are our priority.  As part of our continuing mission to provide you with exceptional heart care, we have created designated Provider Care Teams.  These Care Teams include your primary Cardiologist (physician) and Advanced Practice Providers (APPs -  Physician Assistants and Nurse Practitioners) who all work together to provide you with the care you need, when you need it.  Your next appointment:   3-4 month(s)  Provider:   Peter Swaziland, MD  or  any APP         Other Instructions NONE AT THIS TIME

## 2023-01-14 LAB — BASIC METABOLIC PANEL
BUN/Creatinine Ratio: 31 — ABNORMAL HIGH (ref 10–24)
BUN: 33 mg/dL — ABNORMAL HIGH (ref 8–27)
CO2: 26 mmol/L (ref 20–29)
Calcium: 9.7 mg/dL (ref 8.6–10.2)
Chloride: 104 mmol/L (ref 96–106)
Creatinine, Ser: 1.05 mg/dL (ref 0.76–1.27)
Glucose: 143 mg/dL — ABNORMAL HIGH (ref 70–99)
Potassium: 5.3 mmol/L — ABNORMAL HIGH (ref 3.5–5.2)
Sodium: 143 mmol/L (ref 134–144)
eGFR: 68 mL/min/{1.73_m2} (ref >59)

## 2023-03-16 ENCOUNTER — Ambulatory Visit
Admission: EM | Admit: 2023-03-16 | Discharge: 2023-03-16 | Disposition: A | Discharge status: Home / Self Care | Payer: Medicare Other | Attending: Emergency Medicine | Admitting: Emergency Medicine

## 2023-03-16 ENCOUNTER — Ambulatory Visit (INDEPENDENT_AMBULATORY_CARE_PROVIDER_SITE_OTHER): Payer: Medicare Other

## 2023-03-16 DIAGNOSIS — M25542 Pain in joints of left hand: Secondary | ICD-10-CM | POA: Diagnosis not present

## 2023-03-16 DIAGNOSIS — M79645 Pain in left finger(s): Secondary | ICD-10-CM

## 2023-03-16 NOTE — ED Provider Notes (Signed)
[facility]    CSN: [id number] Arrival date & time: 03/16/23  3235      History   Chief Complaint Chief Complaint  Patient presents with   Hand Pain    LT middle finger    HPI Bruce Mann is a 89 y.o. male.  Here with wife who reports she noticed redness and swelling of his left hand middle finger yesterday The area has been tender to touch Unknown if any injury or trauma. No falls  Hx dementia, afib, CHF, arthritis  No history of gout  Past Medical History:  Diagnosis Date   Anemia    Atrial fibrillation (HCC) 2014   CAD (coronary artery disease)    CHF (congestive heart failure) (HCC) 03/2016   NEW ONSET   Dementia (HCC)    Diabetes mellitus type II 2005   Dyslipidemia    Erectile dysfunction    Gait instability    Hiatal hernia    Hyperlipidemia    Hypertension    Shingles 05/2017   Weakness 03/2016    Patient Active Problem List   Diagnosis Date Noted   Dementia without behavioral disturbance (HCC) 01/30/2020   Iron deficiency anemia 04/28/2016   Chronic diastolic CHF (congestive heart failure) (HCC) 04/16/2016   Anemia    AKI (acute kidney injury) (HCC)    Dyslipidemia    Hypertension    CAD (coronary artery disease)    Hyperlipidemia    Atrial fibrillation (HCC)     Past Surgical History:  Procedure Laterality Date   CARDIAC CATHETERIZATION     Ejection Fraction 60%   CATARACT EXTRACTION, BILATERAL     COLONOSCOPY     TONSILLECTOMY         Home Medications    Prior to Admission medications   Medication Sig Start Date End Date Taking? Authorizing Provider  Acetaminophen (TYLENOL 8 HOUR PO) Take 2 tablets by mouth 2 (two) times daily.    [provider]  amLODipine (NORVASC) 5 MG tablet Take 1 tablet (5 mg total) by mouth daily. 10/24/22 10/19/23  Alver Sorrow, NP  atorvastatin (LIPITOR) 40 MG tablet Take 1 tablet by mouth once daily 01/07/23   Sw[place]iland, Peter M, MD  calcium carbonate (TUMS - DOSED IN MG  ELEMENTAL CALCIUM) 500 MG chewable tablet Chew 1 tablet by mouth daily as needed for indigestion or heartburn. Patient not taking: Reported on 01/13/2023    [provider]  Calcium Carbonate-Vitamin D (CALCIUM PLUS VITAMIN D PO) Take 1 tablet by mouth daily.     [provider]  carvedilol (COREG) 3.125 MG tablet Take 1 tablet (3.125 mg total) by mouth 2 (two) times daily with a meal. 03/05/17   [place], Peter M, MD  diphenhydrAMINE (BENADRYL) 25 MG tablet Take 25 mg by mouth at bedtime. Takes as needed Patient not taking: Reported on 01/13/2023    [provider]  Docusate Calcium (STOOL SOFTENER PO) Take 1-3 Doses by mouth daily.    [provider]  donepezil (ARICEPT) 10 MG tablet Take 1 tablet (10 mg total) by mouth at bedtime. 11/17/22   Lomax, Amy, NP  fluticasone (FLONASE ALLERGY RELIEF) 50 MCG/ACT nasal spray Place 2 sprays into both nostrils as needed. Patient not taking: Reported on 01/13/2023 07/29/18   [provider]  furosemide (LASIX) 20 MG tablet Take 1 tablet (20 mg total) by mouth daily. 09/30/22 09/25/23  Alver Sorrow, NP  LORazepam (ATIVAN) 0.5 MG tablet Take 0.5 mg by mouth at  bedtime as needed for sleep. Patient not taking: Reported on 01/13/2023    [provider]  memantine (NAMENDA) 10 MG tablet Take 1 tablet (10 mg total) by mouth 2 (two) times daily. 11/17/22   Lomax, Amy, NP  metFORMIN (GLUCOPHAGE) 500 MG tablet Take 1 tablet (500 mg total) by mouth 2 (two) times daily with a meal. 03/22/18   [place], Peter M, MD  Multiple Vitamin (MULTIVITAMIN) tablet Take 1 tablet by mouth daily.    [provider]  nitroGLYCERIN (NITROSTAT) 0.4 MG SL tablet DISSOLVE ONE TABLET UNDER THE TONGUE EVERY 5 MINUTES AS NEEDED FOR CHEST PAIN.  DO NOT EXCEED A TOTAL OF 3 DOSES IN 15 MINUTES Patient not taking: Reported on 01/13/2023 09/17/22   [place], Peter M, MD  Omega-3 Fatty Acids (OMEGA 3 500 PO) Take 1 capsule by mouth daily.     [provider]  oxybutynin (DITROPAN-XL) 5 MG 24 hr tablet Take 5 mg by mouth 2 (two) times daily. 10/11/19   [provider]  potassium chloride (MICRO-K) 10 MEQ CR capsule Take 1 capsule (10 mEq total) by mouth daily. 09/30/22   Alver Sorrow, NP  Rivaroxaban (XARELTO) 15 MG TABS tablet Take 1 tablet (15 mg total) by mouth daily with supper. 03/05/17   [place], Peter M, MD  traMADol (ULTRAM) 50 MG tablet Take 1 tablet (50 mg total) by mouth every 8 (eight) hours as needed. Patient not taking: Reported on 01/13/2023 05/17/22   Trevor Iha, FNP  valsartan (DIOVAN) 160 MG tablet Take 1 tablet (160 mg total) by mouth daily. 09/30/22   Alver Sorrow, NP    Family History Family History  Problem Relation Age of Onset   Stroke Mother    Heart attack Father    Heart attack Brother    Neuropathy Brother    Colon cancer Neg Hx     Social History Social History   Tobacco Use   Smoking status: Never   Smokeless tobacco: Never  Vaping Use   Vaping status: Never Used  Substance Use Topics   Alcohol use: Yes    Comment: Occasional glass of wine   Drug use: No     Allergies   Patient has no known allergies.   Review of Systems Review of Systems Per HPI  Physical Exam Triage Vital Signs ED Triage Vitals  Encounter Vitals Group     BP 03/16/23 0852 130/72     Systolic BP Percentile --      Diastolic BP Percentile --      Pulse Rate 03/16/23 0852 (!) 57     Resp 03/16/23 0852 17     Temp 03/16/23 0852 (!) 97.5 F (36.4 C)     Temp Source 03/16/23 0852 Oral     SpO2 03/16/23 0852 97 %     Weight --      Height --      Head Circumference --      Peak Flow --      Pain Score 03/16/23 0850 3     Pain Loc --      Pain Education --      Exclude from Growth Chart --    No data found.  Updated Vital Signs BP 130/72 (BP Location: Right Arm)   Pulse (!) 57   Temp (!) 97.5 F (36.4 C) (Oral)   Resp 17   SpO2 97%   Physical Exam Vitals and nursing  note reviewed.  Constitutional:  General: He is not in acute distress. Cardiovascular:     Rate and Rhythm: Normal rate and regular rhythm.     Pulses: Normal pulses.  Pulmonary:     Effort: Pulmonary effort is normal.  Musculoskeletal:     Left hand: Tenderness present. Decreased range of motion. Normal strength. Normal sensation. Normal capillary refill. Normal pulse.       Hands:     Comments: Small area of erythema on dorsal aspect of L hand, middle finger DIP. Tender to palpation. Reduced ROM at DIP. Normal ROM at PIP and MCP. Grip strength intact. Cap refill < 2 seconds.  Skin:    General: Skin is warm and dry.     Capillary Refill: Capillary refill takes less than 2 seconds.  Neurological:     Mental Status: He is alert. Mental status is at baseline.     UC Treatments / Results  Labs (all labs ordered are listed, but only abnormal results are displayed) Labs Reviewed - No data to display  EKG   Radiology DG Finger Middle Left  Result Date: 03/16/2023 CLINICAL DATA:  Left middle digit pain since yesterday. No known injury. EXAM: LEFT MIDDLE FINGER 2+V COMPARISON:  None Available. FINDINGS: There is an age indeterminate displaced ossicle adjacent to the dorsal aspect of the PIP joint of the middle digit. This finding is associated with adjacent soft tissue swelling. Otherwise, no fracture or dislocation. Moderate to severe degenerative change involving the D IP joint of the middle digit as well as the incidentally imaged PIP and D IP joints of the second digit with joint space loss, subchondral sclerosis and osteophytosis. No opaque foreign body. IMPRESSION: 1. Age indeterminate displaced ossicle adjacent to the dorsal aspect of the PIP joint of the middle digit with associated soft tissue swelling. Correlation for point tenderness at this location is advised. 2. Moderate to severe degenerative change involving the DIP joint of the middle digit as well as the incidentally  imaged PIP and DIP joints of the second digit, potentially degenerative in etiology though conceivably inflammatory arthritis, such as erosive osteoarthritis arthritis, could have a similar appearance. Clinical correlation is advised. Electronically Signed   By: Simonne Come M.D.   On: 03/16/2023 10:43    Procedures Procedures (including critical care time)  Medications Ordered in UC Medications - No data to display  Initial Impression / Assessment and Plan / UC Course  I have reviewed the triage vital signs and the nursing notes.  Pertinent labs & imaging results that were available during my care of the patient were reviewed by me and considered in my medical decision making (see chart for details).   Xray with age indeterminate displaced ossicle at dorsal DIP. Other degenerative changes and arthritis. No acute fracture. Discussed findings with patient and wife. Recommend follow up with orthopedics if still bothering him. It's only tender if he presses directly on the joint. Can use tylenol, discussed avoidance of other medications. No indication for splint at this time. Provided with [facility] information  Final Clinical Impressions(s) / UC Diagnoses   Final diagnoses:  Finger pain, left     Discharge Instructions      There is no fracture or dislocation. However there is a bone fragment in the area of pain, and it's unknown how long that has been there. It may have been a previous injury.  There are also some degenerative change (which happen over time) and signs of arthritis.  For now I recommend using tylenol  for any discomfort  Please follow up with the orthopedic specialists for further evaluation. I recommend [facility], you can walk-in during business hours.      ED Prescriptions   None    PDMP not reviewed this encounter.   Kathrine Haddock 03/16/23 1130

## 2023-03-16 NOTE — ED Triage Notes (Signed)
Pt c/o LT middle finger pain since yesterday. Redness and swelling noted. Denies injury.

## 2023-03-16 NOTE — Discharge Instructions (Addendum)
There is no fracture or dislocation. However there is a bone fragment in the area of pain, and it's unknown how long that has been there. It may have been a previous injury.  There are also some degenerative change (which happen over time) and signs of arthritis.  For now I recommend using tylenol for any discomfort  Please follow up with the orthopedic specialists for further evaluation. I recommend [facility], you can walk-in during business hours.

## 2023-04-06 ENCOUNTER — Encounter: Payer: Self-pay | Admitting: Cardiology

## 2023-04-06 ENCOUNTER — Ambulatory Visit: Payer: Medicare Other | Attending: Cardiology | Admitting: Cardiology

## 2023-04-06 VITALS — BP 144/72 | HR 48 | Ht 68.0 in | Wt 150.8 lb

## 2023-04-06 DIAGNOSIS — I5032 Chronic diastolic (congestive) heart failure: Secondary | ICD-10-CM | POA: Diagnosis not present

## 2023-04-06 DIAGNOSIS — I10 Essential (primary) hypertension: Secondary | ICD-10-CM

## 2023-04-06 DIAGNOSIS — I4821 Permanent atrial fibrillation: Secondary | ICD-10-CM

## 2023-04-06 DIAGNOSIS — I2510 Atherosclerotic heart disease of native coronary artery without angina pectoris: Secondary | ICD-10-CM | POA: Diagnosis not present

## 2023-04-06 MED ORDER — AMLODIPINE BESYLATE 5 MG PO TABS: 7.5000 mg | ORAL_TABLET | Freq: Every day | ORAL | 3 refills | Status: DC

## 2023-04-06 NOTE — Patient Instructions (Addendum)
Medication Instructions:  Stop Carvedilol  Increase Amlodipine to 7.5 mg (a tablet and half) once a day. *If you need a refill on your cardiac medications before your next appointment, please call your pharmacy*   Lab Work: Today: Non fasting BMP If you have labs (blood work) drawn today and your tests are completely normal, you will receive your results only by: MyChart Message (if you have MyChart) OR A paper copy in the mail If you have any lab test that is abnormal or we need to change your treatment, we will call you to review the results.   Testing/Procedures: No Testing   Follow-Up: At [facility], you and your health needs are our priority.  As part of our continuing mission to provide you with exceptional heart care, we have created designated Provider Care Teams.  These Care Teams include your primary Cardiologist (physician) and Advanced Practice Providers (APPs -  Physician Assistants and Nurse Practitioners) who all work together to provide you with the care you need, when you need it.  We recommend signing up for the patient portal called "MyChart".  Sign up information is provided on this After Visit Summary.  MyChart is used to connect with patients for Virtual Visits (Telemedicine).  Patients are able to view lab/test results, encounter notes, upcoming appointments, etc.  Non-urgent messages can be sent to your provider as well.   To learn more about what you can do with MyChart, go to [email or web address].    Your next appointment:   January 10th at 11:20 am   Provider:   Robet Leu, PA-C  Other Instructions Keep blood pressure log at home, if top number of blood pressure is above 145 or below 120 consistently please call our office.

## 2023-04-07 LAB — BASIC METABOLIC PANEL
BUN/Creatinine Ratio: 26 — ABNORMAL HIGH (ref 10–24)
BUN: 29 mg/dL (ref 10–36)
CO2: 22 mmol/L (ref 20–29)
Calcium: 9.8 mg/dL (ref 8.6–10.2)
Chloride: 106 mmol/L (ref 96–106)
Creatinine, Ser: 1.1 mg/dL (ref 0.76–1.27)
Glucose: 80 mg/dL (ref 70–99)
Potassium: 5 mmol/L (ref 3.5–5.2)
Sodium: 143 mmol/L (ref 134–144)
eGFR: 64 mL/min/{1.73_m2} (ref >59)

## 2023-04-09 ENCOUNTER — Other Ambulatory Visit: Payer: Self-pay

## 2023-04-09 DIAGNOSIS — Z79899 Other long term (current) drug therapy: Secondary | ICD-10-CM

## 2023-04-09 DIAGNOSIS — I2510 Atherosclerotic heart disease of native coronary artery without angina pectoris: Secondary | ICD-10-CM

## 2023-05-22 MED ORDER — AMLODIPINE BESYLATE 10 MG PO TABS: 10.0000 mg | ORAL_TABLET | Freq: Every day | ORAL | 3 refills | Status: AC

## 2023-05-22 NOTE — Addendum Note (Signed)
Addended by: Freddi Starr on: 05/22/2023 05:04 PM   Modules accepted: Orders

## 2023-06-29 ENCOUNTER — Encounter: Payer: Self-pay | Admitting: Cardiology

## 2023-07-10 ENCOUNTER — Ambulatory Visit: Payer: Medicare Other | Admitting: Cardiology

## 2023-07-26 NOTE — Progress Notes (Signed)
Cardiology Office Note:  .   Date:  07/31/2023  ID:  Bruce Mann, DOB 12-02-1930, MRN [id number] PCP: Gaspar Garbe, MD  [facility] Providers Cardiologist:  Peter Swaziland, MD   History of Present Illness: .   Bruce Mann is a 89 y.o. male with a past medical history of CAD s/p angioplasty of OM1 in 2001, HTN, diastolic heart failure, permanent atrial fibrillation. Patient is followed by Dr. Swaziland and presents today for HTN follow up.    Per chart review, patient previously had angioplasty of OM1 in 2001. Later, patient had an abnormal nuclear stress test in 2009. Underwent cardiac catheterization that showed three-vessel CAD with normal EF. He was managed medically. Later in 03/2016, patient was admitted with acute diastolic heart failure. Echocardiogram on 04/17/16 showed EF 55-60%, no regional wall motion abnormalities, moderate LVH. His heart rate has been well-controlled on low-dose carvedilol. He was compliant with xarelto.   I last saw patient in clinic on 01/13/23. At that time, patient's wife noted that a few months prior, he was very fatigued and was walking very slowly. However, his symptoms had improved a few weeks before his appointment. BP had been running in the 120s-140s at home. BP in clinic was 108/54. I asked patient and his wife to keep a BP log. He remained on amlodipine 5 mg daily, carvedilol 3.125 mg BID, valsartan 160 mg daily. He was euvolemic on lasix 20 mg daily  I saw patient again in clinic on 04/06/23 for BP follow up. He was feeling very well. However HR was in the 40s on exam. HR had been low at home. Carvedilol was stopped and his amlodipine was increased to 7.5 mg daily. Remained on valsartan 160 mg daily. Amlodipine was later increased to 10 mg daily.   Today, patient presents accompanied by his wife, [name]. Reports that he has been doing well overall. Denies chest pain, palpitations, dizziness, syncope, near syncope. He and his wife have been  monitoring his BP and HR at home. Per review of vital signs, his HR is predominantly in the 70s-80s. His systolic BP is predominantly in the 120s-140s. He had one night when his SBP was in the 170s. Wife reports that earlier that day, he had eaten shrimp and grits at a restaurant, and was not feeling well. Had some nausea. She gave him some lorazepam, and he was able to sleep. In the AM, his BP was back to normal. No shortness of breath or ankle swelling.   ROS: per HPI   Studies Reviewed: .   Cardiac Studies & Procedures      ECHOCARDIOGRAM  ECHOCARDIOGRAM COMPLETE 04/17/2016  Narrative *[place]* *[facility]* [address] [place], [place] [address] [phone]  ------------------------------------------------------------------- Transthoracic Echocardiography  Patient:    Bruce Mann MR #:       [id number] Study Date: 04/17/2016 Gender:     M Age:        44 Height:     177.8 cm Weight:     74.8 kg BSA:        1.93 m^2 Pt. Status: Room:       3E05C  SONOGRAPHER  Laruth Bouchard PERFORMING   [facility], Inpatient ATTENDING    Lynelle Doctor, Md  cc:  ------------------------------------------------------------------- LV EF: 55% -   60%  ------------------------------------------------------------------- Indications:      CHF - 428.0.  ------------------------------------------------------------------- History:   PMH:   Atrial fibrillation.  Coronary artery disease. Risk factors:  Hypertension. Diabetes mellitus. Dyslipidemia.  ------------------------------------------------------------------- Study Conclusions  - Left ventricle: The cavity size was normal. Wall thickness was increased in a pattern of moderate LVH. Systolic function was normal. The estimated ejection fraction was in the range of 55% to 60%. Wall motion was normal; there were no regional wall motion abnormalities. - Mitral valve: Moderately calcified  annulus. - Left atrium: The atrium was mildly dilated. - Right ventricle: The cavity size was mildly dilated. - Right atrium: The atrium was severely dilated. - Pulmonary arteries: Systolic pressure was moderately increased. PA peak pressure: 44 mm Hg (S). - Pericardium, extracardiac: There was a left pleural effusion.  ------------------------------------------------------------------- Study data:  Comparison was made to the study of 07/13/2012.  Study status:  Routine.  Procedure:  Transthoracic echocardiography. Image quality was adequate.  Study completion:  There were no complications.          Transthoracic echocardiography.  M-mode, complete 2D, spectral Doppler, and color Doppler.  Birthdate: Patient birthdate: 05-06-1931.  Age:  Patient is 89 yr old.  Sex: Gender: male.    BMI: 23.6 kg/m^2.  Blood pressure:     116/49 Patient status:  Inpatient.  Study date:  Study date: 04/17/2016. Study time: 09:26 AM.  Location:  Echo laboratory.  -------------------------------------------------------------------  ------------------------------------------------------------------- Left ventricle:  The cavity size was normal. Wall thickness was increased in a pattern of moderate LVH. Systolic function was normal. The estimated ejection fraction was in the range of 55% to 60%. Wall motion was normal; there were no regional wall motion abnormalities. The study was not technically sufficient to allow evaluation of LV diastolic dysfunction due to atrial fibrillation.  ------------------------------------------------------------------- Aortic valve:   Mildly thickened leaflets.  Doppler:   There was no stenosis.   There was no regurgitation.  ------------------------------------------------------------------- Aorta:  Aortic root: The aortic root was normal in size. Ascending aorta: The ascending aorta was normal in  size.  ------------------------------------------------------------------- Mitral valve:   Moderately calcified annulus.  Doppler:  There was no significant regurgitation.    Peak gradient (D): 5 mm Hg.  ------------------------------------------------------------------- Left atrium:  The atrium was mildly dilated.  ------------------------------------------------------------------- Right ventricle:  The cavity size was mildly dilated. Systolic function was normal.  ------------------------------------------------------------------- Pulmonic valve:    The valve appears to be grossly normal. Doppler:  There was mild to moderate regurgitation.  ------------------------------------------------------------------- Tricuspid valve:   The valve appears to be grossly normal. Doppler:  There was trivial regurgitation.  ------------------------------------------------------------------- Pulmonary artery:   Systolic pressure was moderately increased.  ------------------------------------------------------------------- Right atrium:  The atrium was severely dilated.  ------------------------------------------------------------------- Pericardium:  There was no pericardial effusion.  ------------------------------------------------------------------- Pleura:  There was a left pleural effusion.  ------------------------------------------------------------------- Measurements  Left ventricle                         Value        Reference LV ID, ED, PLAX chordal        (L)     34    mm     43 - 52 LV ID, ES, PLAX chordal        (L)     20.9  mm     23 - 38 LV fx shortening, PLAX chordal         39    %      >=29 LV PW thickness, ED  16.4  mm     --------- IVS/LV PW ratio, ED                    0.78         <=1.3  Ventricular septum                     Value        Reference IVS thickness, ED                      12.8  mm     ---------  LVOT                                    Value        Reference LVOT ID, S                             20    mm     --------- LVOT area                              3.14  cm^2   ---------  Aorta                                  Value        Reference Aortic root ID, ED                     28    mm     ---------  Left atrium                            Value        Reference LA ID, A-P, ES                         39    mm     --------- LA ID/bsa, A-P                         2.03  cm/m^2 <=2.2 LA volume, S                           92.8  ml     --------- LA volume/bsa, S                       48.2  ml/m^2 --------- LA volume, ES, 1-p A4C                 83    ml     --------- LA volume/bsa, ES, 1-p A4C             43.1  ml/m^2 --------- LA volume, ES, 1-p A2C                 104   ml     --------- LA volume/bsa, ES, 1-p A2C             54    ml/m^2 ---------  Mitral valve  Value        Reference Mitral deceleration time       (H)     271   ms     150 - 230 Mitral peak gradient, D                5     mm Hg  ---------  Pulmonary arteries                     Value        Reference PA pressure, S, DP             (H)     44    mm Hg  <=30  Tricuspid valve                        Value        Reference Tricuspid regurg peak velocity         302   cm/s   --------- Tricuspid peak RV-RA gradient          36    mm Hg  ---------  Systemic veins                         Value        Reference Estimated CVP                          8     mm Hg  ---------  Right ventricle                        Value        Reference RV pressure, S, DP             (H)     44    mm Hg  <=30  Legend: (L)  and  (H)  mark values outside specified reference range.  ------------------------------------------------------------------- Prepared and Electronically Authenticated by  Kristeen Miss, M.D. 2017-10-19T11:26:39             Risk Assessment/Calculations:    CHA2DS2-VASc Score = 5   This indicates a 7.2% annual risk  of stroke. The patient's score is based upon: CHF History: 1 HTN History: 1 Diabetes History: 0 Stroke History: 0 Vascular Disease History: 1 Age Score: 2 Gender Score: 0       Physical Exam:   VS:  BP 118/60 (BP Location: Left Arm, Patient Position: Sitting, Cuff Size: Normal)   Pulse 76   Ht 5\' 6"  (1.676 m)   Wt 150 lb (68 kg)   SpO2 100%   BMI 24.21 kg/m    Wt Readings from Last 3 Encounters:  07/31/23 150 lb (68 kg)  04/06/23 150 lb 12.8 oz (68.4 kg)  01/13/23 144 lb 12.8 oz (65.7 kg)    GEN: Elderly male, sitting comfortably in the chair. No acute distress  NECK: No JVD CARDIAC: Irregular rate and rhythm. no murmurs, rubs, gallops RESPIRATORY:  Clear to auscultation without rales, wheezing or rhonchi. Normal WOB on room air  ABDOMEN: Soft, non-tender, non-distended EXTREMITIES:  No edema in BLE; No deformity   ASSESSMENT AND PLAN: .    HTN  - Current BP regiment includes amlodipine 10 mg daily, valsartan 160 mg daily. Given advanced age and frailty, BP goal is less than 140/90  - Review of home BP log shows  that his BP is predominantly in the 120s-140s systolic. One night, his BP got up to 170s systolic. He had eaten shrimp and grits from a restaurant, and felt poorly that evening. BP had returned to normal by the next morning. Suspect BP was elevated due to high salt intake after eating restaurant food  - Discussed low sodium diets  - Overall, BP well controlled. No symptoms of hypotension  - Continue current regiment. Discussed that they should continue BP log, and let us know if BP consistently high or low  - BMP from 03/2023 showed creatinine 1.12, K 4.9    Chronic Diastolic Heart Failure  - Most recent echocardiogram from 2017 showed EF 55-60%, no regional wall motion abnormalities, moderate LVH  - Patient has been on lasix 20 mg daily - He is euvolemic on exam- denies recent DOE, ankle edema, shortness of breath  - Continue lasix 20 mg daily    Permanent  atrial fibrillation  - HR well controlled without use of AV nodal medications - mostly in the 70s-80s on home log  - No palpitations, tachycardia at home  - Continue xarelto 15 mg daily-- denies bleeding issues    CAD  - Cardiac catheterization from 2009 showed three-vessel disease  - Patient denies anginal symptoms  - Continue lipitor 40 mg daily. LDL 40 in 03/2023  - Patient not on ASA due to eliquis use  Dispo: Follow up in 6 months with Dr. Swaziland   Signed, Jonita Albee, PA-C

## 2023-07-31 ENCOUNTER — Ambulatory Visit: Payer: Medicare Other | Attending: Cardiology | Admitting: Cardiology

## 2023-07-31 ENCOUNTER — Encounter: Payer: Self-pay | Admitting: Cardiology

## 2023-07-31 VITALS — BP 118/60 | HR 76 | Ht 66.0 in | Wt 150.0 lb

## 2023-07-31 DIAGNOSIS — I4821 Permanent atrial fibrillation: Secondary | ICD-10-CM

## 2023-07-31 DIAGNOSIS — I10 Essential (primary) hypertension: Secondary | ICD-10-CM | POA: Diagnosis not present

## 2023-07-31 DIAGNOSIS — I5032 Chronic diastolic (congestive) heart failure: Secondary | ICD-10-CM

## 2023-07-31 DIAGNOSIS — I2510 Atherosclerotic heart disease of native coronary artery without angina pectoris: Secondary | ICD-10-CM

## 2023-07-31 NOTE — Patient Instructions (Signed)
 Medication Instructions:  No changes *If you need a refill on your cardiac medications before your next appointment, please call your pharmacy*  Lab Work: No labs  Testing/Procedures: No testing  Follow-Up: At [facility], you and your health needs are our priority.  As part of our continuing mission to provide you with exceptional heart care, we have created designated Provider Care Teams.  These Care Teams include your primary Cardiologist (physician) and Advanced Practice Providers (APPs -  Physician Assistants and Nurse Practitioners) who all work together to provide you with the care you need, when you need it.  We recommend signing up for the patient portal called "MyChart".  Sign up information is provided on this After Visit Summary.  MyChart is used to connect with patients for Virtual Visits (Telemedicine).  Patients are able to view lab/test results, encounter notes, upcoming appointments, etc.  Non-urgent messages can be sent to your provider as well.   To learn more about what you can do with MyChart, go to [email or web address].    Your next appointment:   6 month(s)  Provider:   Peter Swaziland, MD

## 2023-09-14 ENCOUNTER — Emergency Department (HOSPITAL_BASED_OUTPATIENT_CLINIC_OR_DEPARTMENT_OTHER): Admitting: Radiology

## 2023-09-14 ENCOUNTER — Encounter (HOSPITAL_BASED_OUTPATIENT_CLINIC_OR_DEPARTMENT_OTHER): Payer: Self-pay

## 2023-09-14 ENCOUNTER — Emergency Department (HOSPITAL_BASED_OUTPATIENT_CLINIC_OR_DEPARTMENT_OTHER)
Admission: EM | Admit: 2023-09-14 | Discharge: 2023-09-14 | Disposition: A | Discharge status: Home / Self Care | Attending: Emergency Medicine | Admitting: Emergency Medicine

## 2023-09-14 ENCOUNTER — Other Ambulatory Visit: Payer: Self-pay

## 2023-09-14 DIAGNOSIS — I4821 Permanent atrial fibrillation: Secondary | ICD-10-CM | POA: Diagnosis not present

## 2023-09-14 DIAGNOSIS — I4891 Unspecified atrial fibrillation: Secondary | ICD-10-CM

## 2023-09-14 DIAGNOSIS — R001 Bradycardia, unspecified: Secondary | ICD-10-CM

## 2023-09-14 DIAGNOSIS — R531 Weakness: Secondary | ICD-10-CM | POA: Diagnosis present

## 2023-09-14 LAB — RESP PANEL BY RT-PCR (RSV, FLU A&B, COVID)  RVPGX2
Influenza A by PCR: NEGATIVE
Influenza B by PCR: NEGATIVE
Resp Syncytial Virus by PCR: NEGATIVE
SARS Coronavirus 2 by RT PCR: NEGATIVE

## 2023-09-14 LAB — COMPREHENSIVE METABOLIC PANEL
ALT: 15 U/L (ref 0–44)
AST: 21 U/L (ref 15–41)
Albumin: 4.2 g/dL (ref 3.5–5.0)
Alkaline Phosphatase: 69 U/L (ref 38–126)
Anion gap: 7 (ref 5–15)
BUN: 38 mg/dL — ABNORMAL HIGH (ref 8–23)
CO2: 23 mmol/L (ref 22–32)
Calcium: 9.8 mg/dL (ref 8.9–10.3)
Chloride: 107 mmol/L (ref 98–111)
Creatinine, Ser: 1.33 mg/dL — ABNORMAL HIGH (ref 0.61–1.24)
GFR, Estimated: 51 mL/min — ABNORMAL LOW (ref >60)
Glucose, Bld: 152 mg/dL — ABNORMAL HIGH (ref 70–99)
Potassium: 4.7 mmol/L (ref 3.5–5.1)
Sodium: 137 mmol/L (ref 135–145)
Total Bilirubin: 0.5 mg/dL (ref 0.0–1.2)
Total Protein: 7.6 g/dL (ref 6.5–8.1)

## 2023-09-14 LAB — CBC WITH DIFFERENTIAL/PLATELET
Abs Immature Granulocytes: 0.03 10*3/uL (ref 0.00–0.07)
Basophils Absolute: 0.1 10*3/uL (ref 0.0–0.1)
Basophils Relative: 1 %
Eosinophils Absolute: 0.3 10*3/uL (ref 0.0–0.5)
Eosinophils Relative: 4 %
HCT: 34.9 % — ABNORMAL LOW (ref 39.0–52.0)
Hemoglobin: 11.1 g/dL — ABNORMAL LOW (ref 13.0–17.0)
Immature Granulocytes: 0 %
Lymphocytes Relative: 20 %
Lymphs Abs: 1.4 10*3/uL (ref 0.7–4.0)
MCH: 31.3 pg (ref 26.0–34.0)
MCHC: 31.8 g/dL (ref 30.0–36.0)
MCV: 98.3 fL (ref 80.0–100.0)
Monocytes Absolute: 0.7 10*3/uL (ref 0.1–1.0)
Monocytes Relative: 10 %
Neutro Abs: 4.5 10*3/uL (ref 1.7–7.7)
Neutrophils Relative %: 65 %
Platelets: 157 10*3/uL (ref 150–400)
RBC: 3.55 MIL/uL — ABNORMAL LOW (ref 4.22–5.81)
RDW: 14.6 % (ref 11.5–15.5)
WBC: 6.9 10*3/uL (ref 4.0–10.5)
nRBC: 0 % (ref 0.0–0.2)

## 2023-09-14 LAB — URINALYSIS, ROUTINE W REFLEX MICROSCOPIC
Bilirubin Urine: NEGATIVE
Glucose, UA: NEGATIVE mg/dL
Hgb urine dipstick: NEGATIVE
Ketones, ur: NEGATIVE mg/dL
Leukocytes,Ua: NEGATIVE
Nitrite: NEGATIVE
Protein, ur: NEGATIVE mg/dL
Specific Gravity, Urine: 1.014 (ref 1.005–1.030)
pH: 5 (ref 5.0–8.0)

## 2023-09-14 LAB — OCCULT BLOOD X 1 CARD TO LAB, STOOL: Fecal Occult Bld: NEGATIVE

## 2023-09-14 LAB — TROPONIN I (HIGH SENSITIVITY): Troponin I (High Sensitivity): 15 ng/L (ref <18)

## 2023-09-14 NOTE — Discharge Instructions (Addendum)
 It was our pleasure to provide your ER care today - we hope that you feel better.  We discussed your case with the cardiologist on-call for Dr Swaziland - for now, he/we recommend that you hold/stop taking the namenda and aricept, as they can cause the heart rate to be slow.   Follow up closely with your cardiologist and neurologist the next 1-2 weeks - call office to arrange appointment.   Return to ER if worse, new symptoms, fainting, chest pain, trouble breathing, or other concern.

## 2023-09-14 NOTE — ED Notes (Signed)
 Given

## 2023-09-14 NOTE — ED Triage Notes (Signed)
 Pt c/o "feeling real bad" yesterday. Wife states that he got up approx 8a, "showered, got dressed," & when she got him up for breakfast, "he said he just felt very wiped out."   Wife advises she has noticed a decline recently, today more abrupt.

## 2023-09-14 NOTE — ED Provider Notes (Addendum)
 [place] [facility]PARTMENT AT [facility] Provider Note   CSN: [id number] Arrival date & time: 09/14/23  1120     History  Chief Complaint  Patient presents with   Weakness    Bruce Mann is a 89 y.o. male.  Pt w hx mild dementia, has felt generally weak in past day. Pt limited historian - level 5 caveat. Denies any unilateral or focal weakness or numbness. No fever or chills. No cough or uri symptoms. No headache. No chest pain or discomfort. No sob or unusual doe. No abd pain or nvd. No blood in stools. No dysuria or gu c/o. No extremity pain or swelling. No recent change in meds. No trauma/fall. Has been eating/drinking. No wt loss.   The history is provided by the patient, medical records and the spouse. The history is limited by the condition of the patient.  Weakness Associated symptoms: no abdominal pain, no chest pain, no cough, no diarrhea, no dysuria, no fever, no headaches, no shortness of breath and no vomiting        Home Medications Prior to Admission medications   Medication Sig Start Date End Date Taking? Authorizing Provider  Acetaminophen (TYLENOL 8 HOUR PO) Take 2 tablets by mouth 2 (two) times daily.    [provider]  amLODipine (NORVASC) 10 MG tablet Take 1 tablet (10 mg total) by mouth daily. 05/22/23 05/16/24  Jonita Albee, PA-C  atorvastatin (LIPITOR) 40 MG tablet Take 1 tablet by mouth once daily 01/07/23   [place], Peter M, MD  calcium carbonate (TUMS - DOSED IN MG ELEMENTAL CALCIUM) 500 MG chewable tablet Chew 1 tablet by mouth daily as needed for indigestion or heartburn.    [provider]  Calcium Carbonate-Vitamin D (CALCIUM PLUS VITAMIN D PO) Take 1 tablet by mouth daily.     [provider]  cetirizine (ZYRTEC) 5 MG tablet Take 5 mg by mouth daily.    [provider]  diphenhydrAMINE (BENADRYL) 25 MG tablet Take 25 mg by mouth at bedtime. Takes as needed Patient not taking: Reported on  07/31/2023    [provider]  Docusate Calcium (STOOL SOFTENER PO) Take 1-3 Doses by mouth daily.    [provider]  donepezil (ARICEPT) 10 MG tablet Take 1 tablet (10 mg total) by mouth at bedtime. 11/17/22   Lomax, Amy, NP  fluticasone (FLONASE ALLERGY RELIEF) 50 MCG/ACT nasal spray Place 2 sprays into both nostrils as needed. 07/29/18   [provider]  furosemide (LASIX) 20 MG tablet Take 1 tablet (20 mg total) by mouth daily. 09/30/22 09/25/23  Alver Sorrow, NP  LORazepam (ATIVAN) 0.5 MG tablet Take 0.5 mg by mouth at bedtime as needed for sleep.    [provider]  memantine (NAMENDA) 10 MG tablet Take 1 tablet (10 mg total) by mouth 2 (two) times daily. 11/17/22   Lomax, Amy, NP  metFORMIN (GLUCOPHAGE) 500 MG tablet Take 1 tablet (500 mg total) by mouth 2 (two) times daily with a meal. 03/22/18   [place], Peter M, MD  Multiple Vitamin (MULTIVITAMIN) tablet Take 1 tablet by mouth daily.    [provider]  nitroGLYCERIN (NITROSTAT) 0.4 MG SL tablet DISSOLVE ONE TABLET UNDER THE TONGUE EVERY 5 MINUTES AS NEEDED FOR CHEST PAIN.  DO NOT EXCEED A TOTAL OF 3 DOSES IN 15 MINUTES 09/17/22   [place], Peter M, MD  Omega-3 Fatty Acids (OMEGA 3 500 PO) Take 1 capsule by mouth daily.  [provider]  oxybutynin (DITROPAN-XL) 5 MG 24 hr tablet Take 5 mg by mouth 2 (two) times daily. Patient not taking: Reported on 07/31/2023 10/11/19   [provider]  potassium chloride (MICRO-K) 10 MEQ CR capsule Take 1 capsule (10 mEq total) by mouth daily. Patient not taking: Reported on 07/31/2023 09/30/22   Alver Sorrow, NP  Rivaroxaban (XARELTO) 15 MG TABS tablet Take 1 tablet (15 mg total) by mouth daily with supper. 03/05/17   [place], Peter M, MD  traMADol (ULTRAM) 50 MG tablet Take 1 tablet (50 mg total) by mouth every 8 (eight) hours as needed. 05/17/22   Trevor Iha, FNP  valsartan (DIOVAN) 160 MG tablet Take 1 tablet (160 mg total) by mouth daily.  09/30/22   Alver Sorrow, NP      Allergies    Patient has no known allergies.    Review of Systems   Review of Systems  Unable to perform ROS: Dementia  Constitutional:  Negative for fever.  HENT:  Negative for sore throat.   Eyes:  Negative for visual disturbance.  Respiratory:  Negative for cough and shortness of breath.   Cardiovascular:  Negative for chest pain.  Gastrointestinal:  Negative for abdominal pain, blood in stool, diarrhea and vomiting.  Genitourinary:  Negative for dysuria and flank pain.  Musculoskeletal:  Negative for back pain and neck pain.  Neurological:  Positive for weakness. Negative for syncope, speech difficulty, numbness and headaches.    Physical Exam Updated Vital Signs BP (!) 136/47   Pulse (!) 50   Temp 98.2 F (36.8 C)   Resp 16   SpO2 97%  Physical Exam Vitals and nursing note reviewed.  Constitutional:      Appearance: Normal appearance. He is well-developed.  HENT:     Head: Atraumatic.     Nose: Nose normal.     Mouth/Throat:     Mouth: Mucous membranes are moist.     Pharynx: Oropharynx is clear.  Eyes:     General: No scleral icterus.    Conjunctiva/sclera: Conjunctivae normal.     Pupils: Pupils are equal, round, and reactive to light.  Neck:     Vascular: No carotid bruit.     Trachea: No tracheal deviation.     Comments: No stiffness or rigidity Cardiovascular:     Rate and Rhythm: Normal rate. Rhythm irregular.     Pulses: Normal pulses.     Heart sounds: Normal heart sounds. No murmur heard.    No friction rub. No gallop.  Pulmonary:     Effort: Pulmonary effort is normal. No accessory muscle usage or respiratory distress.     Breath sounds: Normal breath sounds.  Abdominal:     General: Bowel sounds are normal. There is no distension.     Palpations: Abdomen is soft. There is no mass.     Tenderness: There is no abdominal tenderness. There is no guarding.  Genitourinary:    Comments: No cva tenderness. Stool,  loose, light brown, grossly neg for blood - sent for hemoccult.  Musculoskeletal:        General: No swelling or tenderness.     Cervical back: Normal range of motion and neck supple. No rigidity.     Right lower leg: No edema.     Left lower leg: No edema.  Skin:    General: Skin is warm and dry.     Findings: No rash.  Neurological:     Mental Status: He  is alert.     Comments: Alert, speech clear. Motor/sens grossly intact bil, stre 5/5. Slow, steady gait.   Psychiatric:        Mood and Affect: Mood normal.     ED Results / Procedures / Treatments   Labs (all labs ordered are listed, but only abnormal results are displayed) Results for orders placed or performed during the hospital encounter of 09/14/23  Resp panel by RT-PCR (RSV, Flu A&B, Covid) Anterior Nasal Swab   Collection Time: 09/14/23 12:17 PM   Specimen: Anterior Nasal Swab  Result Value Ref Range   SARS Coronavirus 2 by RT PCR NEGATIVE NEGATIVE   Influenza A by PCR NEGATIVE NEGATIVE   Influenza B by PCR NEGATIVE NEGATIVE   Resp Syncytial Virus by PCR NEGATIVE NEGATIVE  Comprehensive metabolic panel   Collection Time: 09/14/23 12:17 PM  Result Value Ref Range   Sodium 137 135 - 145 mmol/L   Potassium 4.7 3.5 - 5.1 mmol/L   Chloride 107 98 - 111 mmol/L   CO2 23 22 - 32 mmol/L   Glucose, Bld 152 (H) 70 - 99 mg/dL   BUN 38 (H) 8 - 23 mg/dL   Creatinine, Ser 7.82 (H) 0.61 - 1.24 mg/dL   Calcium 9.8 8.9 - 95.6 mg/dL   Total Protein 7.6 6.5 - 8.1 g/dL   Albumin 4.2 3.5 - 5.0 g/dL   AST 21 15 - 41 U/L   ALT 15 0 - 44 U/L   Alkaline Phosphatase 69 38 - 126 U/L   Total Bilirubin 0.5 0.0 - 1.2 mg/dL   GFR, Estimated 51 (L) >60 mL/min   Anion gap 7 5 - 15  CBC with Differential   Collection Time: 09/14/23 12:17 PM  Result Value Ref Range   WBC 6.9 4.0 - 10.5 K/uL   RBC 3.55 (L) 4.22 - 5.81 MIL/uL   Hemoglobin 11.1 (L) 13.0 - 17.0 g/dL   HCT 21.3 (L) 08.6 - 57.8 %   MCV 98.3 80.0 - 100.0 fL   MCH 31.3 26.0 -  34.0 pg   MCHC 31.8 30.0 - 36.0 g/dL   RDW 46.9 62.9 - 52.8 %   Platelets 157 150 - 400 K/uL   nRBC 0.0 0.0 - 0.2 %   Neutrophils Relative % 65 %   Neutro Abs 4.5 1.7 - 7.7 K/uL   Lymphocytes Relative 20 %   Lymphs Abs 1.4 0.7 - 4.0 K/uL   Monocytes Relative 10 %   Monocytes Absolute 0.7 0.1 - 1.0 K/uL   Eosinophils Relative 4 %   Eosinophils Absolute 0.3 0.0 - 0.5 K/uL   Basophils Relative 1 %   Basophils Absolute 0.1 0.0 - 0.1 K/uL   Immature Granulocytes 0 %   Abs Immature Granulocytes 0.03 0.00 - 0.07 K/uL  Troponin I (High Sensitivity)   Collection Time: 09/14/23 12:17 PM  Result Value Ref Range   Troponin I (High Sensitivity) 15 <18 ng/L   DG Chest 2 View Result Date: 09/14/2023 CLINICAL DATA:  Weakness. EXAM: CHEST - 2 VIEW COMPARISON:  07/29/2022. FINDINGS: Bilateral lungs appear hyperexpanded and hyperlucent with coarse bronchovascular markings, in keeping with COPD. Bilateral lungs otherwise appear clear. No dense consolidation or lung collapse. Bilateral costophrenic angles are clear. Stable cardio-mediastinal silhouette. No acute osseous abnormalities. The soft tissues are within normal limits. IMPRESSION: No active cardiopulmonary disease. COPD. Electronically Signed   By: Jules Schick M.D.   On: 09/14/2023 14:29     EKG EKG Interpretation Date/Time:  Monday September 14 2023 12:13:48 EDT Ventricular Rate:  63 PR Interval:    QRS Duration:  86 QT Interval:  432 QTC Calculation: 442 R Axis:   119  Text Interpretation: Atrial fibrillation Right axis deviation Anterior infarct (cited on or before 14-Sep-2023) Abnormal ECG When compared with ECG of 31-Jul-2023 13:55, Current undetermined rhythm precludes rhythm comparison, needs review Confirmed by Virgina Norfolk [phone]) on 09/14/2023 12:16:23 PM  Radiology DG Chest 2 View Result Date: 09/14/2023 CLINICAL DATA:  Weakness. EXAM: CHEST - 2 VIEW COMPARISON:  07/29/2022. FINDINGS: Bilateral lungs appear hyperexpanded and  hyperlucent with coarse bronchovascular markings, in keeping with COPD. Bilateral lungs otherwise appear clear. No dense consolidation or lung collapse. Bilateral costophrenic angles are clear. Stable cardio-mediastinal silhouette. No acute osseous abnormalities. The soft tissues are within normal limits. IMPRESSION: No active cardiopulmonary disease. COPD. Electronically Signed   By: Jules Schick M.D.   On: 09/14/2023 14:29    Procedures Procedures    Medications Ordered in ED Medications - No data to display  ED Course/ Medical Decision Making/ A&P                                 Medical Decision Making Problems Addressed: Atrial fibrillation with slow ventricular response (HCC): acute illness or injury with systemic symptoms that poses a threat to life or bodily functions Generalized weakness: acute illness or injury with systemic symptoms that poses a threat to life or bodily functions Permanent atrial fibrillation (HCC): chronic illness or injury with exacerbation, progression, or side effects of treatment that poses a threat to life or bodily functions Symptomatic bradycardia: acute illness or injury with systemic symptoms that poses a threat to life or bodily functions  Amount and/or Complexity of Data Reviewed Independent Historian: spouse    Details: hx External Data Reviewed: notes. Labs: ordered. Decision-making details documented in ED Course. Radiology: ordered and independent interpretation performed. ECG/medicine tests: ordered and independent interpretation performed. Decision-making details documented in ED Course. Discussion of management or test interpretation with external provider(s): cardiology  Risk Prescription drug management. Decision regarding hospitalization.   Iv ns. Continuous pulse ox and cardiac monitoring. Labs ordered/sent.   Differential diagnosis includes dehydration, anemia, viral syndrome, etc. Dispo decision including potential need for  admission considered - will get labs and imaging and reassess.   Reviewed nursing notes and prior charts for additional history. External reports reviewed. Additional history from: spouse.   Cardiac monitor: afib rate 66.  Labs reviewed/interpreted by me - wbc normal. Hct 35, mildly lower than prior. Bun/cr mildly increased from prior.   Xrays reviewed/interpreted by me - no pna.   Pts heart rate on monitor noted to be as low as 35, generally in upper 30s when resting, in 45-50 range when awake - ? Cause of recent symptoms, symptomatic bradycardia. It appears from prior cardiology note that heart rate noted to be low previously - his carvedilol has been stopped since then, but HR remains low/lower than prior.  Will consult cardiology.   Discussed pt, hx, etc with cardiology on  call, Dr Izora Ribas - he indicates for now, would not consider for intervention/pacemaker, but rather, would recommend holding pts dementia meds as they can cause bradycardia, and have f/u with them and neurology.   Recheck pt, hr 50s-60. Bp normal. No faintness. Discussed plan w pt/spouse.  Return precautions provided.          Final Clinical Impression(s) /  ED Diagnoses Final diagnoses:  None    Rx / DC Orders ED Discharge Orders     None           Cathren Laine, MD 09/14/23 1520

## 2023-09-16 ENCOUNTER — Telehealth: Payer: Self-pay | Admitting: Family Medicine

## 2023-09-16 NOTE — Telephone Encounter (Signed)
 Patient recently in ED for generalized weakness. Noted to have bradycardia. Per ED notes "Discussed pt, hx, etc with cardiology on call, Dr Izora Ribas - he indicates for now, would not consider for intervention/pacemaker, but rather, would recommend holding pts dementia meds as they can cause bradycardia, and have f/u with them and neurology."  Pt has a f/u with Amy sched on 5/20, should this be moved up to discuss medications or ok to keep for now, do you have any recommendations?

## 2023-09-16 NOTE — Telephone Encounter (Signed)
 Bruce Mann

## 2023-09-23 ENCOUNTER — Encounter: Payer: Self-pay | Admitting: Cardiology

## 2023-09-25 ENCOUNTER — Ambulatory Visit: Admitting: Cardiology

## 2023-09-25 NOTE — Progress Notes (Signed)
 Cardiology Office Note:  .   Date:  10/08/2023  ID:  Bruce Mann, DOB 1931/06/10, MRN [id number] PCP: Gaspar Garbe, MD  [facility] Providers Cardiologist:  Peter Swaziland, MD   History of Present Illness: .   Bruce Mann is a 89 y.o. male  with a past medical history of CAD s/p angioplasty of OM1 in 2001, HTN, diastolic heart failure, permanent atrial fibrillation. Patient is followed by Dr. Swaziland and presents today for HTN follow up.    Per chart review, patient previously had angioplasty of OM1 in 2001. Later, patient had an abnormal nuclear stress test in 2009. Underwent cardiac catheterization that showed three-vessel CAD with normal EF. He was managed medically. Later in 03/2016, patient was admitted with acute diastolic heart failure. Echocardiogram on 04/17/16 showed EF 55-60%, no regional wall motion abnormalities, moderate LVH. His heart rate has been well-controlled on low-dose carvedilol. He was compliant with xarelto.    I saw patient in clinic on 04/06/23 for BP follow up. He was feeling very well. However HR was in the 40s on exam. HR had been low at home. Carvedilol was stopped and his amlodipine was increased to 7.5 mg daily. Remained on valsartan 160 mg daily. Amlodipine was later increased to 10 mg daily.   I saw him again in clinic on 07/31/23. At that time, patient reported doing well overall. His HR was in the 70s-80s at home, BP predominantly in the 120s-140s systolic. No shortness of breath or ankle swelling. He remained on amlodipine 10 mg daily, valsartan 160 mg daily, lasix 20 mg daily, xarelto 15 mg daily.   Patient was seen in the ED on 09/14/23 for generalized weakness. In the ED, COVID, flu, RSV were negative. K 4.7, creatinine 1.33, LFTs normal, WBC 6.9, hemoglobin 11.1. hsTn negative. CXR without active cardiopulmonary disease. EKG showed atrial fibrillation with HR 62 BPM. While in the ED, patient was noted to have bradycardia. HR dipped into the  upper 30s when sleeping. Seemed to be in the 50s-60s when awake. Cardiology was called, recommended holding some of his dementia medications that could cause bradycardia.   Today, patient presents for a follow-up after a recent ER visit in March. The patient had experienced generalized weakness and malaise, decided to go to the ED. In the ED, his HR ranged from the 30s-50s. Some of his dementia medications were stopped due to concerns that they were contributing to bradycardia. The patient's heart rate has since stabilized and is now in the seventies. The patient's creatinine was slightly elevated during the ER visit. This has not been rechecked. The patient is currently on Lasix for fluid retention and has been experiencing some swelling in the ankles and an increase in waist size. Denies chest pain, shortness of breath, dizziness, syncope, near syncope.  The patient's blood pressure is stable, often in the 130s at home  ROS: per HPI   Studies Reviewed: .   Cardiac Studies & Procedures   ______________________________________________________________________________________________     ECHOCARDIOGRAM  ECHOCARDIOGRAM COMPLETE 04/17/2016  Narrative *[place]* *[facility]* [address] [place], [place] [address] [phone]  ------------------------------------------------------------------- Transthoracic Echocardiography  Patient:    Bruce Mann, Bruce Mann MR #:       [id number] Study Date: 04/17/2016 Gender:     M Age:        67 Height:     177.8 cm Weight:     74.8 kg BSA:        1.93  m^2 Pt. Status: Room:       3E05C  SONOGRAPHER  Laruth Bouchard PERFORMING   [facility], Inpatient ATTENDING    Lynelle Doctor, Md  cc:  ------------------------------------------------------------------- LV EF: 55% -   60%  ------------------------------------------------------------------- Indications:      CHF -  428.0.  ------------------------------------------------------------------- History:   PMH:   Atrial fibrillation.  Coronary artery disease. Risk factors:  Hypertension. Diabetes mellitus. Dyslipidemia.  ------------------------------------------------------------------- Study Conclusions  - Left ventricle: The cavity size was normal. Wall thickness was increased in a pattern of moderate LVH. Systolic function was normal. The estimated ejection fraction was in the range of 55% to 60%. Wall motion was normal; there were no regional wall motion abnormalities. - Mitral valve: Moderately calcified annulus. - Left atrium: The atrium was mildly dilated. - Right ventricle: The cavity size was mildly dilated. - Right atrium: The atrium was severely dilated. - Pulmonary arteries: Systolic pressure was moderately increased. PA peak pressure: 44 mm Hg (S). - Pericardium, extracardiac: There was a left pleural effusion.  ------------------------------------------------------------------- Study data:  Comparison was made to the study of 07/13/2012.  Study status:  Routine.  Procedure:  Transthoracic echocardiography. Image quality was adequate.  Study completion:  There were no complications.          Transthoracic echocardiography.  M-mode, complete 2D, spectral Doppler, and color Doppler.  Birthdate: Patient birthdate: Nov 17, 1930.  Age:  Patient is 89 yr old.  Sex: Gender: male.    BMI: 23.6 kg/m^2.  Blood pressure:     116/49 Patient status:  Inpatient.  Study date:  Study date: 04/17/2016. Study time: 09:26 AM.  Location:  Echo laboratory.  -------------------------------------------------------------------  ------------------------------------------------------------------- Left ventricle:  The cavity size was normal. Wall thickness was increased in a pattern of moderate LVH. Systolic function was normal. The estimated ejection fraction was in the range of 55% to 60%. Wall motion was  normal; there were no regional wall motion abnormalities. The study was not technically sufficient to allow evaluation of LV diastolic dysfunction due to atrial fibrillation.  ------------------------------------------------------------------- Aortic valve:   Mildly thickened leaflets.  Doppler:   There was no stenosis.   There was no regurgitation.  ------------------------------------------------------------------- Aorta:  Aortic root: The aortic root was normal in size. Ascending aorta: The ascending aorta was normal in size.  ------------------------------------------------------------------- Mitral valve:   Moderately calcified annulus.  Doppler:  There was no significant regurgitation.    Peak gradient (D): 5 mm Hg.  ------------------------------------------------------------------- Left atrium:  The atrium was mildly dilated.  ------------------------------------------------------------------- Right ventricle:  The cavity size was mildly dilated. Systolic function was normal.  ------------------------------------------------------------------- Pulmonic valve:    The valve appears to be grossly normal. Doppler:  There was mild to moderate regurgitation.  ------------------------------------------------------------------- Tricuspid valve:   The valve appears to be grossly normal. Doppler:  There was trivial regurgitation.  ------------------------------------------------------------------- Pulmonary artery:   Systolic pressure was moderately increased.  ------------------------------------------------------------------- Right atrium:  The atrium was severely dilated.  ------------------------------------------------------------------- Pericardium:  There was no pericardial effusion.  ------------------------------------------------------------------- Pleura:  There was a left pleural  effusion.  ------------------------------------------------------------------- Measurements  Left ventricle                         Value        Reference LV ID, ED, PLAX chordal        (L)     34    mm  43 - 52 LV ID, ES, PLAX chordal        (L)     20.9  mm     23 - 38 LV fx shortening, PLAX chordal         39    %      >=29 LV PW thickness, ED                    16.4  mm     --------- IVS/LV PW ratio, ED                    0.78         <=1.3  Ventricular septum                     Value        Reference IVS thickness, ED                      12.8  mm     ---------  LVOT                                   Value        Reference LVOT ID, S                             20    mm     --------- LVOT area                              3.14  cm^2   ---------  Aorta                                  Value        Reference Aortic root ID, ED                     28    mm     ---------  Left atrium                            Value        Reference LA ID, A-P, ES                         39    mm     --------- LA ID/bsa, A-P                         2.03  cm/m^2 <=2.2 LA volume, S                           92.8  ml     --------- LA volume/bsa, S                       48.2  ml/m^2 --------- LA volume, ES, 1-p A4C                 83    ml     --------- LA volume/bsa, ES, 1-p A4C  43.1  ml/m^2 --------- LA volume, ES, 1-p A2C                 104   ml     --------- LA volume/bsa, ES, 1-p A2C             54    ml/m^2 ---------  Mitral valve                           Value        Reference Mitral deceleration time       (H)     271   ms     150 - 230 Mitral peak gradient, D                5     mm Hg  ---------  Pulmonary arteries                     Value        Reference PA pressure, S, DP             (H)     44    mm Hg  <=30  Tricuspid valve                        Value        Reference Tricuspid regurg peak velocity         302   cm/s   --------- Tricuspid peak RV-RA gradient           36    mm Hg  ---------  Systemic veins                         Value        Reference Estimated CVP                          8     mm Hg  ---------  Right ventricle                        Value        Reference RV pressure, S, DP             (H)     44    mm Hg  <=30  Legend: (L)  and  (H)  mark values outside specified reference range.  ------------------------------------------------------------------- Prepared and Electronically Authenticated by  Kristeen Miss, M.D. 2017-10-19T11:26:39          ______________________________________________________________________________________________      Risk Assessment/Calculations:    CHA2DS2-VASc Score = 5   This indicates a 7.2% annual risk of stroke. The patient's score is based upon: CHF History: 1 HTN History: 1 Diabetes History: 0 Stroke History: 0 Vascular Disease History: 1 Age Score: 2 Gender Score: 0    Physical Exam:   VS:  BP 120/60 (Cuff Size: Normal)   Pulse 75   Ht 5\' 5"  (1.651 m)   Wt 153 lb (69.4 kg)   SpO2 96%   BMI 25.46 kg/m    Wt Readings from Last 3 Encounters:  10/08/23 153 lb (69.4 kg)  07/31/23 150 lb (68 kg)  04/06/23 150 lb 12.8 oz (68.4 kg)    GEN: Elderly male, sitting comfortably in the chair. No acute distress  NECK: No JVD  CARDIAC: Irregular  rate and rhythm, no murmurs, rubs, gallops. Radial pulses 2+ bilaterally  RESPIRATORY:  Clear to auscultation without rales, wheezing or rhonchi. Normal WOB on room air  ABDOMEN: Soft, non-tender, non-distended EXTREMITIES:  Trace edema in BLE; No deformity   ASSESSMENT AND PLAN: .    Permanent atrial fibrillation  Bradycardia  - HR well controlled without use of AV nodal medications. He was previously on carvedilol but this was stopped due to bradycardia  - Was seen in the ED on 3/17 for weakness and generalized malaise. Found to have episodes of bradycardia in the ED with HR into the upper 30s when sleeping, in the 50s-60s while  awake. Patient's dementia medications that could cause bradycardia were held  - HR today in the 70s. Denies episodes of dizziness, syncope, near syncope - Considered cardiac monitor, but patient denies symptoms concerning for severe/symptomatic bradycardia and HR is normal on exam today. Additionally, he is no longer on any medications that could slow his HR and I do not think he would be a candidate for PPM. Monitor results are unlikely to change our treatment plan. Hold off on monitor for now  - Continue xarelto 15 mg daily-- denies bleeding issues   HTN  - Current BP regiment includes amlodipine 10 mg daily, valsartan 160 mg daily. Given advanced age and frailty, BP goal is less than 140/90  - Patient's BP has been well controlled at home, often in the 120s-130s systolic. Well controlled today in clinic  - No symptoms of hypotension  - Continue current regiment - BMP from 08/2023 showed creatinine 1.33, K 4.7 - Ordered repeat BMP today to monitor renal function   Chronic Diastolic Heart Failure  - Most recent echocardiogram from 2017 showed EF 55-60%, no regional wall motion abnormalities, moderate LVH  - Patient has been on lasix 20 mg daily - Patient has trace lower extremity swelling, otherwise euvolemic on exam. Denies shortness of breath, orthopnea.  - Creatinine was 1.33 in 08/2023 (previously 1.1 in 03/2023). Ordered BMP to make sure we are not over-diuresing  - Continue lasix 20 mg daily    CAD  - Cardiac catheterization from 2009 showed three-vessel disease  - Patient denies anginal symptoms  - Continue lipitor 40 mg daily. LDL 40 in 03/2023  - Patient not on ASA due to eliquis use  Dispo: Follow up in 3 month with Dr. Swaziland as previously scheduled   Signed, Jonita Albee, PA-C

## 2023-10-05 ENCOUNTER — Other Ambulatory Visit (HOSPITAL_BASED_OUTPATIENT_CLINIC_OR_DEPARTMENT_OTHER): Payer: Self-pay | Admitting: Family

## 2023-10-08 ENCOUNTER — Encounter: Payer: Self-pay | Admitting: Cardiology

## 2023-10-08 ENCOUNTER — Ambulatory Visit: Attending: Cardiology | Admitting: Cardiology

## 2023-10-08 VITALS — BP 120/60 | HR 75 | Ht 65.0 in | Wt 153.0 lb

## 2023-10-08 DIAGNOSIS — R001 Bradycardia, unspecified: Secondary | ICD-10-CM

## 2023-10-08 DIAGNOSIS — I10 Essential (primary) hypertension: Secondary | ICD-10-CM

## 2023-10-08 DIAGNOSIS — I4821 Permanent atrial fibrillation: Secondary | ICD-10-CM | POA: Diagnosis not present

## 2023-10-08 DIAGNOSIS — I2510 Atherosclerotic heart disease of native coronary artery without angina pectoris: Secondary | ICD-10-CM

## 2023-10-08 DIAGNOSIS — I5032 Chronic diastolic (congestive) heart failure: Secondary | ICD-10-CM | POA: Diagnosis not present

## 2023-10-08 NOTE — Patient Instructions (Addendum)
 Medication Instructions:  NO CHANGES *If you need a refill on your cardiac medications before your next appointment, please call your pharmacy*  Lab Work: BMP TODAY If you have labs (blood work) drawn today and your tests are completely normal, you will receive your results only by: MyChart Message (if you have MyChart) OR A paper copy in the mail If you have any lab test that is abnormal or we need to change your treatment, we will call you to review the results.  Testing/Procedures: NO TESTING  Follow-Up: At [facility], you and your health needs are our priority.  As part of our continuing mission to provide you with exceptional heart care, our providers are all part of one team.  This team includes your primary Cardiologist (physician) and Advanced Practice Providers or APPs (Physician Assistants and Nurse Practitioners) who all work together to provide you with the care you need, when you need it.  Your next appointment:   3 month(s)  Provider:   Peter Swaziland, MD   Other Instructions   1st Floor: - Lobby - Registration  - Pharmacy  - Lab - Cafe  2nd Floor: - PV Lab - Diagnostic Testing (echo, CT, nuclear med)  3rd Floor: - Vacant  4th Floor: - TCTS (cardiothoracic surgery) - AFib Clinic - Structural Heart Clinic - Vascular Surgery  - Vascular Ultrasound  5th Floor: - [facility] (general and EP) - Clinical Pharmacy for coumadin, hypertension, lipid, weight-loss medications, and med management appointments    Valet parking services will be available as well.

## 2023-10-09 LAB — BASIC METABOLIC PANEL WITH GFR
BUN/Creatinine Ratio: 27 — ABNORMAL HIGH (ref 10–24)
BUN: 33 mg/dL (ref 10–36)
CO2: 19 mmol/L — ABNORMAL LOW (ref 20–29)
Calcium: 9.6 mg/dL (ref 8.6–10.2)
Chloride: 104 mmol/L (ref 96–106)
Creatinine, Ser: 1.22 mg/dL (ref 0.76–1.27)
Glucose: 100 mg/dL — ABNORMAL HIGH (ref 70–99)
Potassium: 5.1 mmol/L (ref 3.5–5.2)
Sodium: 139 mmol/L (ref 134–144)
eGFR: 56 mL/min/{1.73_m2} — ABNORMAL LOW (ref >59)

## 2023-10-14 ENCOUNTER — Encounter: Payer: Self-pay | Admitting: Family Medicine

## 2023-10-14 ENCOUNTER — Telehealth: Payer: Self-pay

## 2023-10-14 NOTE — Telephone Encounter (Signed)
-----   Message from Debria Fang sent at 10/09/2023 10:14 AM EDT ----- Please tell patient that his lab work showed that his creatinine is back within normal limits at 1.22. This is good news! OK to continue his current dose of lasix.   Of note, his potassium is within normal limits, but does tend to run at the upper limit of normal. He should be cautious about eating too much dietary potassium- recommend decreasing intake of bananas, avocados, sweet potatoes, leafy greens   Thanks KJ

## 2023-10-14 NOTE — Telephone Encounter (Signed)
 Left message to call back

## 2023-10-15 NOTE — Telephone Encounter (Signed)
 Spoke to patient's wife Dr.Jordan agreed with seeing Neurology.Stated he has appointment with Neurology 4/22.

## 2023-10-20 ENCOUNTER — Ambulatory Visit: Admitting: Family Medicine

## 2023-10-20 ENCOUNTER — Encounter: Payer: Self-pay | Admitting: Family Medicine

## 2023-10-20 VITALS — BP 146/62 | HR 69 | Ht 65.0 in | Wt 148.0 lb

## 2023-10-20 DIAGNOSIS — I4811 Longstanding persistent atrial fibrillation: Secondary | ICD-10-CM

## 2023-10-20 DIAGNOSIS — R001 Bradycardia, unspecified: Secondary | ICD-10-CM | POA: Diagnosis not present

## 2023-10-20 DIAGNOSIS — F0390 Unspecified dementia without behavioral disturbance: Secondary | ICD-10-CM

## 2023-10-20 MED ORDER — MEMANTINE HCL 10 MG PO TABS: 10.0000 mg | ORAL_TABLET | Freq: Two times a day (BID) | ORAL | 3 refills | Status: AC

## 2023-10-20 NOTE — Progress Notes (Signed)
 Chief Complaint  Patient presents with   Follow-up    Pt in 1 with wife Pt here for some increase memory loss Wife states ED visit upon d/c stopped Aricept  and namenda      HISTORY OF PRESENT ILLNESS:  10/20/23 ALL: Bruce Mann returns for follow up for dementia. He was last seen 10/2022 and noted some progression but doing fairly well on donepezil  and memantine . He was seen by cardiology 03/2023 and noted to have HR in the 40s. Carvedilol  was stopped. At follow up 07/2023 HR reported ly improved. He was seen in the ER 09/14/2023 for generalized weakness. Creatinine slightly elevated and HR range from 30s (during sleep) to 60s. Donepezil  and memantine  were stopped.   Since, Bruce Mann reports feeling well. Bruce Mann presents with him and aids in history. No significant dizziness or lightheadedness. No changes in gait or concerns of falls. He continues to use his walker. He is sleeping well. Appetite is good. He has not been as active, recently. No longer exercising regularly. Mood seems good. He has had progressive memory loss but Bruce Mann feels it has been markedly worse since discontinuation of donepezil  and memantine . She restarted donepezil  about 4-5 days ago. HR has been in the 40s at home. He continues Xarelto  for persistent afib.   11/17/2022 ALL:  [name] returns for follow up for dementia. He was last seen 10/2021 and doing well. He continues donepezil  10mg  QHS and memantine  10mg  BID. Memory is fairly stable. He may be a little more repetitive in questioning. More confusion at times with how to perform ADLs. He seems to be sleeping well. May be a little less sleepy during the day. He is eating normally. Does have a preference for sweets. Mood is good. No unusual behaviors. Gait is stable with walker. No falls.   11/13/2021 ALL: Bruce Mann returns for follow up for dementia. He continues donepezil  and memantine . He feels symptoms are stable. Bruce Veron agrees. She reports he had good days and bad days.  She reports that he woke up yesterday morning and was asking her questions of who she was and where she was from. He seemed to be better once fully awake and moving around. He is eating well. Sleeping well. He does need help with ADLs. She doses meds. He does not drive. Gait has been stable with walker. No additional falls.   05/15/2021 ALL:  Bruce Mann returns for follow up for dementia. He continues Aricept  10mg  daily. We increased Namenda  to 10mg  BID at last visit 10/2020. He was seen in the ER 02/09/2021 following an unwitnessed fall in the bathroom at [facility]. He was without his walker that day. EMS was concerned of lower extremity weakness and code stroke initiated. CT was unremarkable. Neurology consulted and exam not consistent with acute stroke. Right middle finger dislocation was reduced and patient was discharged home. Since, he is doing well. Memory is about the same. He is tolerating meds. No significant changes and no additional falls.   11/12/2020 ALL:  Bruce Mann returns for dementia follow up. We continues Aricept  10mg  daily and added Namenda  5mg  BID at last visit 07/2020. He has tolerated it well. Bruce Looper isn't sure if it has helped much. Memory seems to wax and wane. He continues to be independent with ADLs. He does not drive. He is eating normally. He is sleeping more but seems to rest better.  He is not very active. He did start PT on his own accord for strength training. He is going  2-3 times a week. He does feel this has been helpful. He denies falls. He is using a walker. He is meeting with his friends for coffee regularly.   08/01/2020 ALL:  Bruce Mann is a 89 y.o. male here today for follow up for dementia. He was started on Aricept  5mg  in 01/2020 and increased dose to 10mg  in 04/2020. He has tolerated medication fairly well. Uncertain if they have noticed any improvements. He continues to have difficulty with short term memory is very repetitive in questioning. He is doing well,  otherwise. He is considering going back to the gym to participate in a senior exercise program. He is not driving at this time due to some pain concerns. He continues to perform ADL's independently. He is walking without difficulty.    HISTORY (copied from Dr Dohmeier's previous note)  Bruce Mann is a 89 year old Caucasian male patient seen here as a referral on 01/30/2020 .  Chief concern according to patient : memory loss -   I have the pleasure of seeing Bruce Mann today, a right-handed White or Caucasian male with a possible dementia  disorder.  he has a past medical history of Anemia, Atrial fibrillation (HCC) 2012/11/15), CAD (coronary artery disease), CHF (congestive heart failure) (HCC) (03/2016), Diabetes mellitus type II 2003-11-16), Dyslipidemia,Hiatal hernia, Hyperlipidemia, Hypertension, Shingles (05/2017) on the right upper back.   Family medical history: No other family member with memory loss. Brother died in Nov 16, 2023 and had few memory issues at age 70.    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 He lives in a household with 2 persons. Family status is married with 4 children, 5 grandchildren.  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  Pets are not present. Tobacco use-  none.   ETOH use : seldomly,  Caffeine intake in form of Coffee( 1 cup decaffeinated ) Soda( social ) Tea ( /) or energy drinks.   Patient has noted difficulties to answer questions, is unsure about number of grandchildren. Twice a week in the gym. He does drive in town, not highway.  He has had days when he felt unable to imaging to plan the route to a location. He is a native [place] resident.    REVIEW OF SYSTEMS: Out of a complete 14 system review of symptoms, the patient complains only of the following symptoms, memory loss, bursitis and all other reviewed systems are negative.   ALLERGIES: No Known Allergies   HOME  MEDICATIONS: Outpatient Medications Prior to Visit  Medication Sig Dispense Refill   Acetaminophen  (TYLENOL  8 HOUR PO) Take 2 tablets by mouth 2 (two) times daily.     amLODipine  (NORVASC ) 10 MG tablet Take 1 tablet (10 mg total) by mouth daily. 90 tablet 3   atorvastatin  (LIPITOR ) 40 MG tablet Take 1 tablet by mouth once daily 90 tablet 3   calcium  carbonate (TUMS - DOSED IN MG ELEMENTAL CALCIUM ) 500 MG chewable tablet Chew 1 tablet by mouth daily as needed for indigestion or heartburn.     Calcium  Carbonate-Vitamin D (CALCIUM  PLUS VITAMIN D PO) Take 1 tablet by mouth daily.      cetirizine (ZYRTEC) 5 MG tablet Take 5 mg by mouth daily.     Docusate Calcium  (STOOL SOFTENER PO) Take 1-3 Doses by mouth daily.     fluticasone  (FLONASE  ALLERGY RELIEF) 50 MCG/ACT nasal spray Place 2 sprays into both nostrils  as needed.     furosemide  (LASIX ) 20 MG tablet Take 1 tablet by mouth once daily 90 tablet 1   LORazepam (ATIVAN) 0.5 MG tablet Take 0.5 mg by mouth at bedtime as needed for sleep.     metFORMIN  (GLUCOPHAGE ) 500 MG tablet Take 1 tablet (500 mg total) by mouth 2 (two) times daily with a meal.     Multiple Vitamin (MULTIVITAMIN) tablet Take 1 tablet by mouth daily.     nitroGLYCERIN  (NITROSTAT ) 0.4 MG SL tablet DISSOLVE ONE TABLET UNDER THE TONGUE EVERY 5 MINUTES AS NEEDED FOR CHEST PAIN.  DO NOT EXCEED A TOTAL OF 3 DOSES IN 15 MINUTES 25 tablet 3   Omega-3 Fatty Acids (OMEGA 3 500 PO) Take 1 capsule by mouth daily.     Rivaroxaban  (XARELTO ) 15 MG TABS tablet Take 1 tablet (15 mg total) by mouth daily with supper. 90 tablet 3   traMADol  (ULTRAM ) 50 MG tablet Take 1 tablet (50 mg total) by mouth every 8 (eight) hours as needed. 15 tablet 0   valsartan  (DIOVAN ) 160 MG tablet Take 1 tablet by mouth once daily 90 tablet 1   No facility-administered medications prior to visit.     PAST MEDICAL HISTORY: Past Medical History:  Diagnosis Date   Anemia    Atrial fibrillation (HCC) 2014   CAD  (coronary artery disease)    CHF (congestive heart failure) (HCC) 03/2016   NEW ONSET   Dementia (HCC)    Diabetes mellitus type II 2005   Dyslipidemia    Erectile dysfunction    Gait instability    Hiatal hernia    Hyperlipidemia    Hypertension    Shingles 05/2017   Weakness 03/2016     PAST SURGICAL HISTORY: Past Surgical History:  Procedure Laterality Date   CARDIAC CATHETERIZATION     Ejection Fraction 60%   CATARACT EXTRACTION, BILATERAL     COLONOSCOPY     TONSILLECTOMY       FAMILY HISTORY: Family History  Problem Relation Age of Onset   Stroke Mother    Heart attack Father    Heart attack Brother    Neuropathy Brother    Colon cancer Neg Hx    Alzheimer's disease Neg Hx      SOCIAL HISTORY: Social History   Socioeconomic History   Marital status: Married    Spouse name: Haskell Linker   Number of children: 4   Years of education: Not on file   Highest education level: Not on file  Occupational History   Occupation: real Engineer, drilling  Tobacco Use   Smoking status: Never   Smokeless tobacco: Never  Vaping Use   Vaping status: Never Used  Substance and Sexual Activity   Alcohol use: Yes    Comment: Occasional glass of wine   Drug use: No   Sexual activity: Not on file  Other Topics Concern   Not on file  Social History Narrative   Pt lives with husband    Retired    Chief Executive Officer [employer] investment banker Strain: Not on file  Food Insecurity: Not on file  Transportation Needs: Not on file  Physical Activity: Not on file  Stress: Not on file  Social Connections: Not on file  Intimate Partner Violence: Not on file      PHYSICAL EXAM  Vitals:   10/20/23 0904  BP: (!) 146/62  Pulse: 69  Weight: 148 lb (67.1 kg)  Height: 5\' 5"  (1.651 m)  Body mass index is 24.63 kg/m.   Generalized: Well developed, in no acute distress  Cardiology: normal rate and rhythm, no murmur auscultated  Respiratory: clear to auscultation  bilaterally    Neurological examination  Mentation: Alert, not oriented to time, but he is to place and history taking. Follows all commands speech and language fluent Cranial nerve II-XII: Pupils were equal round reactive to light. Extraocular movements were full, visual field were full on confrontational test. Facial sensation and strength were normal. Head turning and shoulder shrug  were normal and symmetric. Motor: The motor testing reveals 5 over 5 strength of all 4 extremities. Good symmetric motor tone is noted throughout.  Sensory: Sensory testing is intact to soft touch on all 4 extremities. No evidence of extinction is noted.  Coordination: Cerebellar testing reveals good finger-nose-finger and heel-to-shin bilaterally.  Gait and station: Pushes to standing position. Gait is stable with walker. He does have a slightly stooped posture    DIAGNOSTIC DATA (LABS, IMAGING, TESTING) - I reviewed patient records, labs, notes, testing and imaging myself where available.  Lab Results  Component Value Date   WBC 6.9 09/14/2023   HGB 11.1 (L) 09/14/2023   HCT 34.9 (L) 09/14/2023   MCV 98.3 09/14/2023   PLT 157 09/14/2023      Component Value Date/Time   NA 139 10/08/2023 1516   K 5.1 10/08/2023 1516   CL 104 10/08/2023 1516   CO2 19 (L) 10/08/2023 1516   GLUCOSE 100 (H) 10/08/2023 1516   GLUCOSE 152 (H) 09/14/2023 1217   BUN 33 10/08/2023 1516   CREATININE 1.22 10/08/2023 1516   CREATININE 1.43 (H) 04/25/2016 1143   CALCIUM  9.6 10/08/2023 1516   PROT 7.6 09/14/2023 1217   ALBUMIN 4.2 09/14/2023 1217   AST 21 09/14/2023 1217   ALT 15 09/14/2023 1217   ALKPHOS 69 09/14/2023 1217   BILITOT 0.5 09/14/2023 1217   GFRNONAA 51 (L) 09/14/2023 1217   GFRNONAA 45 (L) 04/25/2016 1143   GFRAA 52 (L) 04/25/2016 1143   No results found for: "CHOL", "HDL", "LDLCALC", "LDLDIRECT", "TRIG", "CHOLHDL" No results found for: "HGBA1C" Lab Results  Component Value Date   VITAMINB12 583  04/17/2016   Lab Results  Component Value Date   TSH 5.557 (H) 04/16/2016       10/20/2023    9:27 AM 11/17/2022    2:29 PM 11/13/2021    2:42 PM  MMSE - Mini Mental State Exam  Orientation to time 0 0 1  Orientation to Place 2 3 3   Registration 3 3 3   Attention/ Calculation 0 0 1  Recall 0 0 0  Language- name 2 objects 2 2 2   Language- repeat 1 1 1   Language- follow 3 step command 1 3 1   Language- read & follow direction 1 1 1   Write a sentence 0 1 1  Copy design 0 0 0  Total score 10 14 14          No data to display           ASSESSMENT AND PLAN  89 y.o. year old male  has a past medical history of Anemia, Atrial fibrillation (HCC) (2014), CAD (coronary artery disease), CHF (congestive heart failure) (HCC) (03/2016), Dementia (HCC), Diabetes mellitus type II (2005), Dyslipidemia, Erectile dysfunction, Gait instability, Hiatal hernia, Hyperlipidemia, Hypertension, Shingles (05/2017), and Weakness (03/2016). here with   Dementia without behavioral disturbance (HCC)  Longstanding persistent atrial fibrillation (HCC)  Bradycardia  Bruce Mann is doing fairly  well, today. He has continued to have lower HR at home, reportedly in the 40s. I do not advise continuing donepezil  and have asked Bruce Mann to discontinue this medication. Although postmarking data suggests there may be a link with memantine  causing bradycardia, we have discussed and Bruce and Bruce Bednarczyk agree to restart low dose for a week then increase to 10mg  twice daily. Bruce Mann will monitor heart rate closely. If rate continues to remain in 40s, she will discontinue memantine . Baseline HR in the 50s. He is usually asymptomatic. MMSE 10/30, previously 14/30. Fall precautions reviewed. He will continue healthy lifestyle habits. Memory compensation strategies reviewed. I have encouraged him to continue regular physical and mental activity.  He will continue close follow-up with primary care.  I will have him follow-up  with me in 4 months.  He verbalizes understanding and agreement with this plan.  No orders of the defined types were placed in this encounter.    Meds ordered this encounter  Medications   memantine  (NAMENDA ) 10 MG tablet    Sig: Take 1 tablet (10 mg total) by mouth 2 (two) times daily.    Dispense:  180 tablet    Refill:  3    Supervising Provider:   AHERN, ANTONIA B [id number]     I spent 30 minutes of face-to-face and non-face-to-face time with patient.  This included previsit chart review, lab review, study review, order entry, electronic health record documentation, patient education.   Terrilyn Fick, MSN, FNP-C 10/20/2023, 10:30 AM  [facility] [address] [place], [place] [address] [phone]

## 2023-10-20 NOTE — Patient Instructions (Addendum)
 Below is our plan:  We will restart memantine  5mg  twice daily for 1 week then increase to 10mg  twice daily if well tolerated. I advise stopping donepezil  due to low heart rate. We will continue to monitor HR closely. There is some post marketing data that shows memantine  can be linked to low heart rate as well so please monitor closely. If heart rate remains in 40s after 4 weeks of resuming memantine , please discontinue and let me know.    Please make sure you are staying well hydrated. I recommend 50-60 ounces daily. Well balanced diet and regular exercise encouraged. Consistent sleep schedule with 6-8 hours recommended.   Please continue follow up with care team as directed.   Follow up with me in 4 months   You may receive a survey regarding today's visit. I encourage you to leave honest feed back as I do use this information to improve patient care. Thank you for seeing me today!   Management of Memory Problems   There are some general things you can do to help manage your memory problems.  Your memory may not in fact recover, but by using techniques and strategies you will be able to manage your memory difficulties better.   1)  Establish a routine. Try to establish and then stick to a regular routine.  By doing this, you will get used to what to expect and you will reduce the need to rely on your memory.  Also, try to do things at the same time of day, such as taking your medication or checking your calendar first thing in the morning. Think about think that you can do as a part of a regular routine and make a list.  Then enter them into a daily planner to remind you.  This will help you establish a routine.   2)  Organize your environment. Organize your environment so that it is uncluttered.  Decrease visual stimulation.  Place everyday items such as keys or cell phone in the same place every day (ie.  Basket next to front door) Use post it notes with a brief message to yourself (ie.  Turn off light, lock the door) Use labels to indicate where things go (ie. Which cupboards are for food, dishes, etc.) Keep a notepad and pen by the telephone to take messages   3)  Memory Aids A diary or journal/notebook/daily planner Making a list (shopping list, chore list, to do list that needs to be done) Using an alarm as a reminder (kitchen timer or cell phone alarm) Using cell phone to store information (Notes, Calendar, Reminders) Calendar/White board placed in a prominent position Post-it notes   In order for memory aids to be useful, you need to have good habits.  It's no good remembering to make a note in your journal if you don't remember to look in it.  Try setting aside a certain time of day to look in journal.   4)  Improving mood and managing fatigue. There may be other factors that contribute to memory difficulties.  Factors, such as anxiety, depression and tiredness can affect memory. Regular gentle exercise can help improve your mood and give you more energy. Exercise: there are short videos created by the [employer] on Health specially for older adults: https://bit.ly/2I30q97.  Mediterranean diet: which emphasizes fruits, vegetables, whole grains, legumes, fish, and other seafood; unsaturated fats such as olive oils; and low amounts of red meat, eggs, and sweets. A variation of this, called MIND (Mediterranean-DASH  Intervention for Neurodegenerative Delay) incorporates the DASH (Dietary Approaches to Stop Hypertension) diet, which has been shown to lower high blood pressure, a risk factor for Alzheimer's disease. More information at: ExitMarketing.de.  Aerobic exercise that improve heart health is also good for the mind.  [employer] on Aging have short videos for exercises that you can do at home: BlindWorkshop.com.pt Simple relaxation techniques may help relieve symptoms of  anxiety Try to get back to completing activities or hobbies you enjoyed doing in the past. Learn to pace yourself through activities to decrease fatigue. Find out about some local support groups where you can share experiences with others. Try and achieve 7-8 hours of sleep at night.   Resources for Family/Caregiver  Online caregiver support groups can be found at WesternTunes.it or call Alzheimer's Association's 24/7 hotline: (531)066-3230. [facility] Memory Counseling Program offers in-person, virtual support groups and individual counseling for both care partners and persons with memory loss. Call for more information at (725)593-3073.   Advanced care plan: there are two types of Power of Attorney: healthcare and durable. Healthcare POA is a designated person to make healthcare decisions on your behalf if you were too sick to make them yourself. This person can be selected and documented by your physician. Durable POA has to be set up with a lawyer who takes charge of your finances and estate if you were too sick or cognitively impaired to manage your finances accurately. You can find a local Elder Therapist, art here: NewportRanch.at.  Check out www.planyourlifespan.org, which will help you plan before a crisis and decide who will take care of life considerations in a circumstance where you may not be able to speak for yourself.   Helpful books (available on [employer] or your local bookstore):  By Dr. Albina Hull: Keeping Love Alive as Memories Fade: The 5 Love Languages and the Alzheimer's Journey Mar 31, 2015 The Dementia Care Partner's Workbook: A Guide for Understanding, Education, and [employer] - November 28, 2017.  Both available for less than $15.   "Coping with behavior change in dementia: a family caregiver's guide" by Asberry Lav & Leonides Ramp "A Caregiver's Guide to Dementia: Using Activities and Other Strategies to Prevent, Reduce and Manage Behavioral Symptoms" by Beth Brooke Gitlin and  Catherine Piersol.  Youth worker of Joy for the Person with Alzheimer's or Dementia" 4th edition by Hardy Lia  Caregiver videos on common behaviors related to dementia: PopulationGame.pl  [place] Caregiver Portal: free to sign up, links to local resources: [email or web address]

## 2023-10-27 NOTE — Telephone Encounter (Signed)
 Left message to call back

## 2023-10-30 NOTE — Telephone Encounter (Signed)
 Called patient advised of below they verbalized understanding No questions at this time.

## 2023-11-17 ENCOUNTER — Ambulatory Visit: Payer: Medicare Other | Admitting: Family Medicine

## 2023-11-30 DIAGNOSIS — I509 Heart failure, unspecified: Secondary | ICD-10-CM | POA: Insufficient documentation

## 2023-11-30 DIAGNOSIS — F0390 Unspecified dementia without behavioral disturbance: Secondary | ICD-10-CM | POA: Insufficient documentation

## 2023-12-02 DIAGNOSIS — I7090 Unspecified atherosclerosis: Secondary | ICD-10-CM | POA: Insufficient documentation

## 2023-12-02 DIAGNOSIS — I10 Essential (primary) hypertension: Secondary | ICD-10-CM | POA: Insufficient documentation

## 2023-12-02 DIAGNOSIS — N189 Chronic kidney disease, unspecified: Secondary | ICD-10-CM | POA: Insufficient documentation

## 2023-12-02 DIAGNOSIS — G311 Senile degeneration of brain, not elsewhere classified: Secondary | ICD-10-CM | POA: Insufficient documentation

## 2023-12-02 DIAGNOSIS — E785 Hyperlipidemia, unspecified: Secondary | ICD-10-CM | POA: Insufficient documentation

## 2023-12-02 DIAGNOSIS — D509 Iron deficiency anemia, unspecified: Secondary | ICD-10-CM | POA: Insufficient documentation

## 2023-12-02 DIAGNOSIS — E1122 Type 2 diabetes mellitus with diabetic chronic kidney disease: Secondary | ICD-10-CM | POA: Insufficient documentation

## 2023-12-02 DIAGNOSIS — I4821 Permanent atrial fibrillation: Secondary | ICD-10-CM | POA: Insufficient documentation

## 2023-12-02 DIAGNOSIS — R001 Bradycardia, unspecified: Secondary | ICD-10-CM | POA: Insufficient documentation

## 2024-01-05 NOTE — Progress Notes (Deleted)
 Cardiology Office Note:  .   Date:  01/05/2024  ID:  Bruce Mann, DOB [birth year], MRN [id number] PCP: Vernadine Charlie ORN, MD  [facility] Providers Cardiologist:  Eddie Payette [place], MD   History of Present Illness: .   Bruce Mann is a 89 y.o. male  with a past medical history of CAD s/p angioplasty of OM1 in 2001, HTN, diastolic heart failure, permanent atrial fibrillation. Patient is followed by Dr. Swaziland and presents today for HTN follow up.    Per chart review, patient previously had angioplasty of OM1 in 2001. Later, patient had an abnormal nuclear stress test in 2009. Underwent cardiac catheterization that showed three-vessel CAD with normal EF. He was managed medically. Later in 03/2016, patient was admitted with acute diastolic heart failure. Echocardiogram on 04/17/16 showed EF 55-60%, no regional wall motion abnormalities, moderate LVH. His heart rate has been well-controlled on low-dose carvedilol . He was compliant with xarelto .    Seen in clinic on 04/06/23 for BP follow up. He was feeling very well. However HR was in the 40s on exam. HR had been low at home. Carvedilol  was stopped and his amlodipine  was increased to 7.5 mg daily. Remained on valsartan  160 mg daily. Amlodipine  was later increased to 10 mg daily.   Patient was seen in the ED on 09/14/23 for generalized weakness. In the ED, COVID, flu, RSV were negative. K 4.7, creatinine 1.33, LFTs normal, WBC 6.9, hemoglobin 11.1. hsTn negative. CXR without active cardiopulmonary disease. EKG showed atrial fibrillation with HR 62 BPM. While in the ED, patient was noted to have bradycardia. HR dipped into the upper 30s when sleeping. Seemed to be in the 50s-60s when awake. Cardiology was called, recommended holding some of his dementia medications that could cause bradycardia. On follow up in office HR was in 70s.   ROS: per HPI   Studies Reviewed: .   Cardiac Studies & Procedures    ______________________________________________________________________________________________     ECHOCARDIOGRAM  ECHOCARDIOGRAM COMPLETE 04/17/2016  Narrative *[place]* *[facility]* [address] [place], [place] [address] [phone]  ------------------------------------------------------------------- Transthoracic Echocardiography  Patient:    Bruce Mann, Bruce Mann MR #:       [id number] Study Date: 04/17/2016 Gender:     M Age:        57 Height:     177.8 cm Weight:     74.8 kg BSA:        1.93 m^2 Pt. Status: Room:       3E05C  SONOGRAPHER  Jon Cordella TISA Lonzell Emeline CHRISTELLA PERFORMING   [facility], Inpatient ATTENDING    Randol, Md  cc:  ------------------------------------------------------------------- LV EF: 55% -   60%  ------------------------------------------------------------------- Indications:      CHF - 428.0.  ------------------------------------------------------------------- History:   PMH:   Atrial fibrillation.  Coronary artery disease. Risk factors:  Hypertension. Diabetes mellitus. Dyslipidemia.  ------------------------------------------------------------------- Study Conclusions  - Left ventricle: The cavity size was normal. Wall thickness was increased in a pattern of moderate LVH. Systolic function was normal. The estimated ejection fraction was in the range of 55% to 60%. Wall motion was normal; there were no regional wall motion abnormalities. - Mitral valve: Moderately calcified annulus. - Left atrium: The atrium was mildly dilated. - Right ventricle: The cavity size was mildly dilated. - Right atrium: The atrium was severely dilated. - Pulmonary arteries: Systolic pressure was moderately increased. PA peak pressure: 44 mm Hg (S). - Pericardium, extracardiac: There was  a left pleural effusion.  ------------------------------------------------------------------- Study data:  Comparison was made to the  study of 07/13/2012.  Study status:  Routine.  Procedure:  Transthoracic echocardiography. Image quality was adequate.  Study completion:  There were no complications.          Transthoracic echocardiography.  M-mode, complete 2D, spectral Doppler, and color Doppler.  Birthdate: Patient birthdate: 26-May-1931.  Age:  Patient is 89 yr old.  Sex: Gender: male.    BMI: 23.6 kg/m^2.  Blood pressure:     116/49 Patient status:  Inpatient.  Study date:  Study date: 04/17/2016. Study time: 09:26 AM.  Location:  Echo laboratory.  -------------------------------------------------------------------  ------------------------------------------------------------------- Left ventricle:  The cavity size was normal. Wall thickness was increased in a pattern of moderate LVH. Systolic function was normal. The estimated ejection fraction was in the range of 55% to 60%. Wall motion was normal; there were no regional wall motion abnormalities. The study was not technically sufficient to allow evaluation of LV diastolic dysfunction due to atrial fibrillation.  ------------------------------------------------------------------- Aortic valve:   Mildly thickened leaflets.  Doppler:   There was no stenosis.   There was no regurgitation.  ------------------------------------------------------------------- Aorta:  Aortic root: The aortic root was normal in size. Ascending aorta: The ascending aorta was normal in size.  ------------------------------------------------------------------- Mitral valve:   Moderately calcified annulus.  Doppler:  There was no significant regurgitation.    Peak gradient (D): 5 mm Hg.  ------------------------------------------------------------------- Left atrium:  The atrium was mildly dilated.  ------------------------------------------------------------------- Right ventricle:  The cavity size was mildly dilated. Systolic function was  normal.  ------------------------------------------------------------------- Pulmonic valve:    The valve appears to be grossly normal. Doppler:  There was mild to moderate regurgitation.  ------------------------------------------------------------------- Tricuspid valve:   The valve appears to be grossly normal. Doppler:  There was trivial regurgitation.  ------------------------------------------------------------------- Pulmonary artery:   Systolic pressure was moderately increased.  ------------------------------------------------------------------- Right atrium:  The atrium was severely dilated.  ------------------------------------------------------------------- Pericardium:  There was no pericardial effusion.  ------------------------------------------------------------------- Pleura:  There was a left pleural effusion.  ------------------------------------------------------------------- Measurements  Left ventricle                         Value        Reference LV ID, ED, PLAX chordal        (L)     34    mm     43 - 52 LV ID, ES, PLAX chordal        (L)     20.9  mm     23 - 38 LV fx shortening, PLAX chordal         39    %      >=29 LV PW thickness, ED                    16.4  mm     --------- IVS/LV PW ratio, ED                    0.78         <=1.3  Ventricular septum                     Value        Reference IVS thickness, ED                      12.8  mm     ---------  LVOT                                   Value        Reference LVOT ID, S                             20    mm     --------- LVOT area                              3.14  cm^2   ---------  Aorta                                  Value        Reference Aortic root ID, ED                     28    mm     ---------  Left atrium                            Value        Reference LA ID, A-P, ES                         39    mm     --------- LA ID/bsa, A-P                         2.03  cm/m^2 <=2.2 LA  volume, S                           92.8  ml     --------- LA volume/bsa, S                       48.2  ml/m^2 --------- LA volume, ES, 1-p A4C                 83    ml     --------- LA volume/bsa, ES, 1-p A4C             43.1  ml/m^2 --------- LA volume, ES, 1-p A2C                 104   ml     --------- LA volume/bsa, ES, 1-p A2C             54    ml/m^2 ---------  Mitral valve                           Value        Reference Mitral deceleration time       (H)     271   ms     150 - 230 Mitral peak gradient, D                5     mm Hg  ---------  Pulmonary arteries                     Value  Reference PA pressure, S, DP             (H)     44    mm Hg  <=30  Tricuspid valve                        Value        Reference Tricuspid regurg peak velocity         302   cm/s   --------- Tricuspid peak RV-RA gradient          36    mm Hg  ---------  Systemic veins                         Value        Reference Estimated CVP                          8     mm Hg  ---------  Right ventricle                        Value        Reference RV pressure, S, DP             (H)     44    mm Hg  <=30  Legend: (L)  and  (H)  mark values outside specified reference range.  ------------------------------------------------------------------- Prepared and Electronically Authenticated by  Bruce Mann, M.D. 2017-10-19T11:26:39          ______________________________________________________________________________________________      Risk Assessment/Calculations:    CHA2DS2-VASc Score = 5   This indicates a 7.2% annual risk of stroke. The patient's score is based upon: CHF History: 1 HTN History: 1 Diabetes History: 0 Stroke History: 0 Vascular Disease History: 1 Age Score: 2 Gender Score: 0    Physical Exam:   VS:  There were no vitals taken for this visit.   Wt Readings from Last 3 Encounters:  10/20/23 148 lb (67.1 kg)  10/08/23 153 lb (69.4 kg)  07/31/23 150 lb (68 kg)     GEN: Elderly male, sitting comfortably in the chair. No acute distress  NECK: No JVD  CARDIAC: Irregular rate and rhythm, no murmurs, rubs, gallops. Radial pulses 2+ bilaterally  RESPIRATORY:  Clear to auscultation without rales, wheezing or rhonchi. Normal WOB on room air  ABDOMEN: Soft, non-tender, non-distended EXTREMITIES:  Trace edema in BLE; No deformity   ASSESSMENT AND PLAN: .    Permanent atrial fibrillation  Bradycardia  - HR well controlled without use of AV nodal medications. He was previously on carvedilol  but this was stopped due to bradycardia  - Was seen in the ED on 3/17 for weakness and generalized malaise. Found to have episodes of bradycardia in the ED with HR into the upper 30s when sleeping, in the 50s-60s while awake. Patient's dementia medications that could cause bradycardia were held  - HR today in the 70s. Denies episodes of dizziness, syncope, near syncope - Considered cardiac monitor, but patient denies symptoms concerning for severe/symptomatic bradycardia and HR is normal on exam today. Additionally, he is no longer on any medications that could slow his HR and I do not think he would be a candidate for PPM. Monitor results are unlikely to change our treatment plan. Hold off on monitor for now  - Continue xarelto  15 mg daily-- denies bleeding issues  HTN  - Current BP regiment includes amlodipine  10 mg daily, valsartan  160 mg daily. Given advanced age and frailty, BP goal is less than 140/90  - Patient's BP has been well controlled at home, often in the 120s-130s systolic. Well controlled today in clinic  - No symptoms of hypotension  - Continue current regiment - BMP from 08/2023 showed creatinine 1.33, K 4.7 - Ordered repeat BMP today to monitor renal function   Chronic Diastolic Heart Failure  - Most recent echocardiogram from 2017 showed EF 55-60%, no regional wall motion abnormalities, moderate LVH  - Patient has been on lasix  20 mg daily - Patient  has trace lower extremity swelling, otherwise euvolemic on exam. Denies shortness of breath, orthopnea.  - Creatinine was 1.33 in 08/2023 (previously 1.1 in 03/2023). Ordered BMP to make sure we are not over-diuresing  - Continue lasix  20 mg daily    CAD  - Cardiac catheterization from 2009 showed three-vessel disease  - Patient denies anginal symptoms  - Continue lipitor  40 mg daily. LDL 40 in 03/2023  - Patient not on ASA due to eliquis use  Dispo: Follow up in 3 month with Dr. Swaziland as previously scheduled   Signed, Shavonn Convey Swaziland, MD

## 2024-01-07 ENCOUNTER — Telehealth: Payer: Self-pay | Admitting: Cardiology

## 2024-01-07 NOTE — Telephone Encounter (Signed)
 Spoke to patient's wife she wanted Dr.Jordan to know husband is now under hospice care.He no longer needs any appointments.She wanted to thank Dr.Jordan for all his good care.Advised I will make him aware.

## 2024-01-07 NOTE — Telephone Encounter (Signed)
 Wife Idell) stated patient has now been placed in hospice and wants a call back regarding patient no longer needing appointments

## 2024-01-08 ENCOUNTER — Ambulatory Visit: Admitting: Cardiology

## 2024-02-15 ENCOUNTER — Encounter: Payer: Self-pay | Admitting: Family Medicine

## 2024-02-16 ENCOUNTER — Ambulatory Visit: Admitting: Family Medicine

## 2024-02-16 ENCOUNTER — Encounter: Payer: Self-pay | Admitting: Family Medicine

## 2024-02-16 VITALS — BP 129/68 | HR 67 | Ht 65.0 in | Wt 137.0 lb

## 2024-02-16 DIAGNOSIS — F0390 Unspecified dementia without behavioral disturbance: Secondary | ICD-10-CM

## 2024-02-16 NOTE — Patient Instructions (Signed)
 Below is our plan:  We will continue memantine  10mg  twice daily. Let me know if refills are needed.   Please make sure you are staying well hydrated. I recommend 50-60 ounces daily. Well balanced diet and regular exercise encouraged. Consistent sleep schedule with 6-8 hours recommended.   Please continue follow up with care team as directed.   Follow up with me as needed   You may receive a survey regarding today's visit. I encourage you to leave honest feed back as I do use this information to improve patient care. Thank you for seeing me today!   Management of Memory Problems   There are some general things you can do to help manage your memory problems.  Your memory may not in fact recover, but by using techniques and strategies you will be able to manage your memory difficulties better.   1)  Establish a routine. Try to establish and then stick to a regular routine.  By doing this, you will get used to what to expect and you will reduce the need to rely on your memory.  Also, try to do things at the same time of day, such as taking your medication or checking your calendar first thing in the morning. Think about think that you can do as a part of a regular routine and make a list.  Then enter them into a daily planner to remind you.  This will help you establish a routine.   2)  Organize your environment. Organize your environment so that it is uncluttered.  Decrease visual stimulation.  Place everyday items such as keys or cell phone in the same place every day (ie.  Basket next to front door) Use post it notes with a brief message to yourself (ie. Turn off light, lock the door) Use labels to indicate where things go (ie. Which cupboards are for food, dishes, etc.) Keep a notepad and pen by the telephone to take messages   3)  Memory Aids A diary or journal/notebook/daily planner Making a list (shopping list, chore list, to do list that needs to be done) Using an alarm as a reminder  (kitchen timer or cell phone alarm) Using cell phone to store information (Notes, Calendar, Reminders) Calendar/White board placed in a prominent position Post-it notes   In order for memory aids to be useful, you need to have good habits.  It's no good remembering to make a note in your journal if you don't remember to look in it.  Try setting aside a certain time of day to look in journal.   4)  Improving mood and managing fatigue. There may be other factors that contribute to memory difficulties.  Factors, such as anxiety, depression and tiredness can affect memory. Regular gentle exercise can help improve your mood and give you more energy. Exercise: there are short videos created by the [employer] on Health specially for older adults: https://bit.ly/2I30q97.  Mediterranean diet: which emphasizes fruits, vegetables, whole grains, legumes, fish, and other seafood; unsaturated fats such as olive oils; and low amounts of red meat, eggs, and sweets. A variation of this, called MIND (Mediterranean-DASH Intervention for Neurodegenerative Delay) incorporates the DASH (Dietary Approaches to Stop Hypertension) diet, which has been shown to lower high blood pressure, a risk factor for Alzheimer's disease. More information at: ExitMarketing.de.  Aerobic exercise that improve heart health is also good for the mind.  [employer] on Aging have short videos for exercises that you can do at home: BlindWorkshop.com.pt Simple  relaxation techniques may help relieve symptoms of anxiety Try to get back to completing activities or hobbies you enjoyed doing in the past. Learn to pace yourself through activities to decrease fatigue. Find out about some local support groups where you can share experiences with others. Try and achieve 7-8 hours of sleep at night.   Resources for Family/Caregiver  Online caregiver support  groups can be found at WesternTunes.it or call Alzheimer's Association's 24/7 hotline: 8454328784. [facility] Memory Counsel[facility] Program offers in-person, virtual support groups and individual counseling for both care partners and persons with memory loss. Call for more information at [phone].   Advanced care plan: there are two types of Power of Attorney: healthcare and durable. Healthcare POA is a designated person to make healthcare decisions on your behalf if you were too sick to make them yourself. This person can be selected and documented by your physician. Durable POA has to be set up with a lawyer who takes charge of your finances and estate if you were too sick or cognitively impaired to manage your finances accurately. You can find a local Elder Therapist, art here: [place]Ranch.at.  Check out www.planyourlifespan.org, which will help you plan before a crisis and decide who will take care of life considerations in a circumstance where you may not be able to speak for yourself.   Helpful books (available on [employer] or your local bookstore):  By Dr. Katherene Gentry: Keeping Love Alive as Memories Fade: The 5 Love Languages and the Alzheimer's Journey Mar 31, 2015 The Dementia Care Partner's Workbook: A Guide for Understanding, Education, and [employer] - November 28, 2017.  Both available for less than $15.   Coping with behavior change in dementia: a family caregiver's guide by Landry Mirza & Mitzie Pizza A Caregiver's Guide to Dementia: Using Activities and Other Strategies to Prevent, Reduce and Manage Behavioral Symptoms by Leita SAILOR. [place] and Catherine Piersol.  Creating Moments of Joy for the Person with Alzheimer's or Dementia 4th edition by Melanie Mings  Caregiver videos on common behaviors related to dementia: PopulationGame.pl  [place] Caregiver Portal: free to sign up, links to local resources: [email or web address]-caregivers.com/login

## 2024-02-16 NOTE — Progress Notes (Signed)
 Chief Complaint  Patient presents with   RM1/MEMORY    Pt is here with his Wife. Pt's wife states that pt has declined in memory since his last appointment. Pt's wife states that pt is sleeping more than normal. Pt's wife states that pt has a lot of weakness in his legs and needs help getting dressed. Pt can still feed himself and he is sleeping well at night.    HISTORY OF PRESENT ILLNESS:  02/16/24 ALL: Bruce Mann returns for follow up for dementia. He was last seen 09/2023 and donepezil  and memantine  had been stopped due to low heart rate. We opted to restart low dose memantine  and monitor HR closely. Since, Mrs Rahal reports they have consulted with Hospice. He is now under their care. He seems to be doing fairly well. He sleeps more during the day. Sleeping well at night. He is eating well. Usually more alert from 4p-11p. Eats a good dinner. Has wheelchair now. Tolerating memantine . May have helped some with processing. Pulse stable. Hospice RN comes weekly and chaplain comes a couple times a month.   10/20/2023 ALL:  Bruce Mann returns for follow up for dementia. He was last seen 10/2022 and noted some progression but doing fairly well on donepezil  and memantine . He was seen by cardiology 03/2023 and noted to have HR in the 40s. Carvedilol  was stopped. At follow up 07/2023 HR reported ly improved. He was seen in the ER 09/14/2023 for generalized weakness. Creatinine slightly elevated and HR range from 30s (during sleep) to 60s. Donepezil  and memantine  were stopped.   Since, Mr Hockey reports feeling well. Mrs Freiberger presents with him and aids in history. No significant dizziness or lightheadedness. No changes in gait or concerns of falls. He continues to use his walker. He is sleeping well. Appetite is good. He has not been as active, recently. No longer exercising regularly. Mood seems good. He has had progressive memory loss but Mrs Dendinger feels it has been markedly worse since discontinuation of  donepezil  and memantine . She restarted donepezil  about 4-5 days ago. HR has been in the 40s at home. He continues Xarelto  for persistent afib.   11/17/2022 ALL:  Bruce Mann returns for follow up for dementia. He was last seen 10/2021 and doing well. He continues donepezil  10mg  QHS and memantine  10mg  BID. Memory is fairly stable. He may be a little more repetitive in questioning. More confusion at times with how to perform ADLs. He seems to be sleeping well. May be a little less sleepy during the day. He is eating normally. Does have a preference for sweets. Mood is good. No unusual behaviors. Gait is stable with walker. No falls.   11/13/2021 ALL: Bruce Mann returns for follow up for dementia. He continues donepezil  and memantine . He feels symptoms are stable. Mrs Rawl agrees. She reports he had good days and bad days. She reports that he woke up yesterday morning and was asking her questions of who she was and where she was from. He seemed to be better once fully awake and moving around. He is eating well. Sleeping well. He does need help with ADLs. She doses meds. He does not drive. Gait has been stable with walker. No additional falls.   05/15/2021 ALL:  Bruce Mann returns for follow up for dementia. He continues Aricept  10mg  daily. We increased Namenda  to 10mg  BID at last visit 10/2020. He was seen in the ER 02/09/2021 following an unwitnessed fall in the bathroom at [facility]. He was without his walker  that day. EMS was concerned of lower extremity weakness and code stroke initiated. CT was unremarkable. Neurology consulted and exam not consistent with acute stroke. Right middle finger dislocation was reduced and patient was discharged home. Since, he is doing well. Memory is about the same. He is tolerating meds. No significant changes and no additional falls.   11/12/2020 ALL:  Bruce Mann returns for dementia follow up. We continues Aricept  10mg  daily and added Namenda  5mg  BID at last visit 07/2020. He has tolerated it  well. Mrs Turney isn't sure if it has helped much. Memory seems to wax and wane. He continues to be independent with ADLs. He does not drive. He is eating normally. He is sleeping more but seems to rest better.  He is not very active. He did start PT on his own accord for strength training. He is going 2-3 times a week. He does feel this has been helpful. He denies falls. He is using a walker. He is meeting with his friends for coffee regularly.   08/01/2020 ALL:  Bruce Mann is a 89 y.o. male here today for follow up for dementia. He was started on Aricept  5mg  in 01/2020 and increased dose to 10mg  in 04/2020. He has tolerated medication fairly well. Uncertain if they have noticed any improvements. He continues to have difficulty with short term memory is very repetitive in questioning. He is doing well, otherwise. He is considering going back to the gym to participate in a senior exercise program. He is not driving at this time due to some pain concerns. He continues to perform ADL's independently. He is walking without difficulty.    HISTORY (copied from Dr Dohmeier's previous note)  Bruce Mann is a 89 year old Caucasian male patient seen here as a referral on 01/30/2020 .  Chief concern according to patient : memory loss -   I have the pleasure of seeing Bruce Mann today, a right-handed White or Caucasian male with a possible dementia  disorder.  he has a past medical history of Anemia, Atrial fibrillation (HCC) (2014), CAD (coronary artery disease), CHF (congestive heart failure) (HCC) (03/2016), Diabetes mellitus type II (2005), Dyslipidemia,Hiatal hernia, Hyperlipidemia, Hypertension, Shingles (05/2017) on the right upper back.   Family medical history: No other family member with memory loss. Brother died in Nov 17, 2023 and had few memory issues at age 58.    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 He lives in a household  with 2 persons. Family status is married with 4 children, 5 grandchildren.  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  Pets are not present. Tobacco use-  none.   ETOH use : seldomly,  Caffeine intake in form of Coffee( 1 cup decaffeinated ) Soda( social ) Tea ( /) or energy drinks.   Patient has noted difficulties to answer questions, is unsure about number of grandchildren. Twice a week in the gym. He does drive in town, not highway.  He has had days when he felt unable to imaging to plan the route to a location. He is a native [place] resident.    REVIEW OF SYSTEMS: Out of a complete 14 system review of symptoms, the patient complains only of the following symptoms, memory loss, bursitis and all other reviewed systems are negative.   ALLERGIES: No Known Allergies   HOME MEDICATIONS: Outpatient Medications Prior to Visit  Medication Sig Dispense Refill  Acetaminophen  (TYLENOL  8 HOUR PO) Take 2 tablets by mouth 2 (two) times daily.     atorvastatin  (LIPITOR ) 40 MG tablet Take 1 tablet by mouth once daily 90 tablet 3   calcium  carbonate (TUMS - DOSED IN MG ELEMENTAL CALCIUM ) 500 MG chewable tablet Chew 1 tablet by mouth daily as needed for indigestion or heartburn.     cetirizine (ZYRTEC) 5 MG tablet Take 5 mg by mouth daily.     cholecalciferol (VITAMIN D3) 25 MCG (1000 UNIT) tablet Take 1,000 Units by mouth daily.     Docusate Calcium  (STOOL SOFTENER PO) Take 1-3 Doses by mouth daily.     fluticasone  (FLONASE  ALLERGY RELIEF) 50 MCG/ACT nasal spray Place 2 sprays into both nostrils as needed.     furosemide  (LASIX ) 20 MG tablet Take 1 tablet by mouth once daily 90 tablet 1   LORazepam (ATIVAN) 0.5 MG tablet Take 0.5 mg by mouth at bedtime as needed for sleep.     memantine  (NAMENDA ) 10 MG tablet Take 1 tablet (10 mg total) by mouth 2 (two) times daily. 180 tablet 3   metFORMIN  (GLUCOPHAGE ) 500 MG tablet Take 1 tablet (500 mg total) by mouth 2 (two) times daily with a  meal.     Omega-3 Fatty Acids (OMEGA 3 500 PO) Take 1 capsule by mouth daily.     Rivaroxaban  (XARELTO ) 15 MG TABS tablet Take 1 tablet (15 mg total) by mouth daily with supper. 90 tablet 3   amLODipine  (NORVASC ) 10 MG tablet Take 1 tablet (10 mg total) by mouth daily. (Patient not taking: Reported on 02/16/2024) 90 tablet 3   Calcium  Carbonate-Vitamin D (CALCIUM  PLUS VITAMIN D PO) Take 1 tablet by mouth daily.  (Patient not taking: Reported on 02/16/2024)     Multiple Vitamin (MULTIVITAMIN) tablet Take 1 tablet by mouth daily. (Patient not taking: Reported on 02/16/2024)     nitroGLYCERIN  (NITROSTAT ) 0.4 MG SL tablet DISSOLVE ONE TABLET UNDER THE TONGUE EVERY 5 MINUTES AS NEEDED FOR CHEST PAIN.  DO NOT EXCEED A TOTAL OF 3 DOSES IN 15 MINUTES (Patient not taking: Reported on 02/16/2024) 25 tablet 3   traMADol  (ULTRAM ) 50 MG tablet Take 1 tablet (50 mg total) by mouth every 8 (eight) hours as needed. (Patient not taking: Reported on 02/16/2024) 15 tablet 0   valsartan  (DIOVAN ) 160 MG tablet Take 1 tablet by mouth once daily (Patient not taking: Reported on 02/16/2024) 90 tablet 1   No facility-administered medications prior to visit.     PAST MEDICAL HISTORY: Past Medical History:  Diagnosis Date   Anemia    Atrial fibrillation (HCC) 2014   CAD (coronary artery disease)    CHF (congestive heart failure) (HCC) 03/2016   NEW ONSET   Dementia (HCC)    Diabetes mellitus type II 2005   Dyslipidemia    Erectile dysfunction    Gait instability    Hiatal hernia    Hyperlipidemia    Hypertension    Shingles 05/2017   Weakness 03/2016     PAST SURGICAL HISTORY: Past Surgical History:  Procedure Laterality Date   CARDIAC CATHETERIZATION     Ejection Fraction 60%   CATARACT EXTRACTION, BILATERAL     COLONOSCOPY     TONSILLECTOMY       FAMILY HISTORY: Family History  Problem Relation Age of Onset   Stroke Mother    Heart attack Father    Heart attack Brother    Neuropathy Brother     Colon cancer Neg  Hx    Alzheimer's disease Neg Hx      SOCIAL HISTORY: Social History   Socioeconomic History   Marital status: Married    Spouse name: Inocente   Number of children: 4   Years of education: Not on file   Highest education level: Not on file  Occupational History   Occupation: Naval architect  Tobacco Use   Smoking status: Never   Smokeless tobacco: Never  Vaping Use   Vaping status: Never Used  Substance and Sexual Activity   Alcohol use: Yes    Comment: Occasional glass of wine   Drug use: No   Sexual activity: Not on file  Other Topics Concern   Not on file  Social History Narrative   Pt lives with husband    Retired    Chief Executive Officer Drivers of Corporate investment banker Strain: Not on Ship broker Insecurity: Not on file  Transportation Needs: Not on file  Physical Activity: Not on file  Stress: Not on file  Social Connections: Not on file  Intimate Partner Violence: Not on file      PHYSICAL EXAM  Vitals:   02/16/24 1352  BP: 129/68  Pulse: 67  SpO2: 98%  Weight: 137 lb (62.1 kg)  Height: 5' 5 (1.651 m)       Body mass index is 22.8 kg/m.   Generalized: Well developed, in no acute distress  Cardiology: normal rate, irregular rhythm, known afib no murmur auscultated  Respiratory: clear to auscultation bilaterally    Neurological examination  Mentation: Alert, not oriented to time, but he is to place and history taking. Follows all commands speech and language fluent Cranial nerve II-XII: Pupils were equal round reactive to light. Extraocular movements were full, visual field were full on confrontational test. Facial sensation and strength were normal. Head turning and shoulder shrug  were normal and symmetric. Motor: The motor testing reveals 5 over 5 strength of all 4 extremities. Good symmetric motor tone is noted throughout.  Gait and station: not assessed, in wheelchair    DIAGNOSTIC DATA (LABS, IMAGING, TESTING) - I  reviewed patient records, labs, notes, testing and imaging myself where available.  Lab Results  Component Value Date   WBC 6.9 09/14/2023   HGB 11.1 (L) 09/14/2023   HCT 34.9 (L) 09/14/2023   MCV 98.3 09/14/2023   PLT 157 09/14/2023      Component Value Date/Time   NA 139 10/08/2023 1516   K 5.1 10/08/2023 1516   CL 104 10/08/2023 1516   CO2 19 (L) 10/08/2023 1516   GLUCOSE 100 (H) 10/08/2023 1516   GLUCOSE 152 (H) 09/14/2023 1217   BUN 33 10/08/2023 1516   CREATININE 1.22 10/08/2023 1516   CREATININE 1.43 (H) 04/25/2016 1143   CALCIUM  9.6 10/08/2023 1516   PROT 7.6 09/14/2023 1217   ALBUMIN 4.2 09/14/2023 1217   AST 21 09/14/2023 1217   ALT 15 09/14/2023 1217   ALKPHOS 69 09/14/2023 1217   BILITOT 0.5 09/14/2023 1217   GFRNONAA 51 (L) 09/14/2023 1217   GFRNONAA 45 (L) 04/25/2016 1143   GFRAA 52 (L) 04/25/2016 1143   No results found for: CHOL, HDL, LDLCALC, LDLDIRECT, TRIG, CHOLHDL No results found for: YHAJ8R Lab Results  Component Value Date   VITAMINB12 583 04/17/2016   Lab Results  Component Value Date   TSH 5.557 (H) 04/16/2016       10/20/2023    9:27 AM 11/17/2022    2:29 PM 11/13/2021  2:42 PM  MMSE - Mini Mental State Exam  Orientation to time 0 0 1  Orientation to Place 2 3 3   Registration 3 3 3   Attention/ Calculation 0 0 1  Recall 0 0 0  Language- name 2 objects 2 2 2   Language- repeat 1 1 1   Language- follow 3 step command 1 3 1   Language- read & follow direction 1 1 1   Write a sentence 0 1 1  Copy design 0 0 0  Total score 10 14 14          No data to display           ASSESSMENT AND PLAN  89 y.o. year old male  has a past medical history of Anemia, Atrial fibrillation (HCC) (2014), CAD (coronary artery disease), CHF (congestive heart failure) (HCC) (03/2016), Dementia (HCC), Diabetes mellitus type II (2005), Dyslipidemia, Erectile dysfunction, Gait instability, Hiatal hernia, Hyperlipidemia, Hypertension, Shingles  (05/2017), and Weakness (03/2016). here with   Dementia without behavioral disturbance [facility])  Mr Hernon is doing fairly well, today. He has tolerated memantine . Now in Hospice care. MMSE not completed. Fall precautions and memory compensation strategies advised. I have encouraged him to continue regular physical and mental activity.  He will continue close follow-up with primary care/hospice.  I will have him follow-up with me as needed.  He verbalizes understanding and agreement with this plan.  No orders of the defined types were placed in this encounter.    No orders of the defined types were placed in this encounter.    I spent 30 minutes of face-to-face and non-face-to-face time with patient.  This included previsit chart review, lab review, study review, order entry, electronic health record documentation, patient education.   Greig Forbes, MSN, FNP-C 02/16/2024, 1:57 PM  [facility] [address], [address] [place], [place] [address] [phone]

## 2024-03-16 ENCOUNTER — Encounter: Payer: Self-pay | Admitting: Hematology and Oncology

## 2024-04-30 DEATH — deceased

## 2024-05-09 ENCOUNTER — Encounter: Payer: Self-pay | Admitting: Hematology and Oncology

## 2024-06-28 ENCOUNTER — Encounter: Payer: Self-pay | Admitting: Hematology and Oncology
